# Patient Record
Sex: Male | Born: 1950 | Race: White | Hispanic: No | Marital: Married | Smoking: Never smoker
Health system: Southern US, Community
[De-identification: ages and names within clinical notes are randomized; demographics above are authoritative.]

## PROBLEM LIST (undated history)

## (undated) DIAGNOSIS — M1990 Unspecified osteoarthritis, unspecified site: Secondary | ICD-10-CM

## (undated) DIAGNOSIS — R39198 Other difficulties with micturition: Secondary | ICD-10-CM

## (undated) DIAGNOSIS — K7460 Unspecified cirrhosis of liver: Secondary | ICD-10-CM

## (undated) DIAGNOSIS — N200 Calculus of kidney: Secondary | ICD-10-CM

## (undated) DIAGNOSIS — E119 Type 2 diabetes mellitus without complications: Secondary | ICD-10-CM

## (undated) DIAGNOSIS — D696 Thrombocytopenia, unspecified: Secondary | ICD-10-CM

## (undated) DIAGNOSIS — N3000 Acute cystitis without hematuria: Secondary | ICD-10-CM

## (undated) DIAGNOSIS — N390 Urinary tract infection, site not specified: Secondary | ICD-10-CM

## (undated) DIAGNOSIS — R310 Gross hematuria: Secondary | ICD-10-CM

## (undated) HISTORY — PX: ORIF ANKLE FRACTURE: SUR919

## (undated) HISTORY — DX: Calculus of kidney: N20.0

## (undated) HISTORY — PX: JOINT REPLACEMENT: SHX530

## (undated) HISTORY — DX: Other difficulties with micturition: R39.198

## (undated) HISTORY — DX: Unspecified cirrhosis of liver: K74.60

## (undated) HISTORY — PX: FRACTURE SURGERY: SHX138

## (undated) HISTORY — DX: Acute cystitis without hematuria: N30.00

## (undated) HISTORY — DX: Gross hematuria: R31.0

---

## 2000-08-16 LAB — HM COLONOSCOPY

## 2006-05-09 ENCOUNTER — Ambulatory Visit: Payer: Self-pay

## 2012-08-28 ENCOUNTER — Inpatient Hospital Stay: Payer: Self-pay | Admitting: Orthopedic Surgery

## 2012-08-28 HISTORY — PX: ANKLE SURGERY: SHX546

## 2012-08-28 LAB — PROTIME-INR
INR: 1
Prothrombin Time: 13.7 secs (ref 11.5–14.7)

## 2012-08-28 LAB — COMPREHENSIVE METABOLIC PANEL
Albumin: 4.1 g/dL (ref 3.4–5.0)
Alkaline Phosphatase: 88 U/L (ref 50–136)
BUN: 15 mg/dL (ref 7–18)
Bilirubin,Total: 0.9 mg/dL (ref 0.2–1.0)
Co2: 28 mmol/L (ref 21–32)
Creatinine: 1.19 mg/dL (ref 0.60–1.30)
EGFR (African American): >60
EGFR (Non-African Amer.): >60
SGOT(AST): 49 U/L — ABNORMAL HIGH (ref 15–37)
SGPT (ALT): 50 U/L (ref 12–78)
Total Protein: 7.8 g/dL (ref 6.4–8.2)

## 2012-08-28 LAB — CBC
HCT: 47.4 % (ref 40.0–52.0)
HGB: 16.1 g/dL (ref 13.0–18.0)
MCH: 30.3 pg (ref 26.0–34.0)
MCHC: 34 g/dL (ref 32.0–36.0)
MCV: 89 fL (ref 80–100)
RBC: 5.32 10*6/uL (ref 4.40–5.90)

## 2012-08-28 LAB — APTT: Activated PTT: 30 secs (ref 23.6–35.9)

## 2012-08-30 LAB — CBC WITH DIFFERENTIAL/PLATELET
Basophil #: 0.1 10*3/uL (ref 0.0–0.1)
Eosinophil %: 1 %
HCT: 40.2 % (ref 40.0–52.0)
Lymphocyte #: 1.1 10*3/uL (ref 1.0–3.6)
MCV: 89 fL (ref 80–100)
Monocyte %: 8.3 %
Neutrophil #: 10.7 10*3/uL — ABNORMAL HIGH (ref 1.4–6.5)
Neutrophil %: 82 %
Platelet: 119 10*3/uL — ABNORMAL LOW (ref 150–440)
RBC: 4.53 10*6/uL (ref 4.40–5.90)
WBC: 13 10*3/uL — ABNORMAL HIGH (ref 3.8–10.6)

## 2012-08-30 LAB — BASIC METABOLIC PANEL
Anion Gap: 7 (ref 7–16)
BUN: 13 mg/dL (ref 7–18)
Calcium, Total: 8.2 mg/dL — ABNORMAL LOW (ref 8.5–10.1)
Chloride: 105 mmol/L (ref 98–107)
EGFR (African American): >60
EGFR (Non-African Amer.): >60
Glucose: 131 mg/dL — ABNORMAL HIGH (ref 65–99)
Osmolality: 278 (ref 275–301)
Potassium: 4.1 mmol/L (ref 3.5–5.1)

## 2012-12-02 ENCOUNTER — Encounter: Payer: Self-pay | Admitting: Cardiothoracic Surgery

## 2012-12-02 ENCOUNTER — Encounter: Payer: Self-pay | Admitting: Nurse Practitioner

## 2012-12-27 ENCOUNTER — Encounter: Payer: Self-pay | Admitting: Nurse Practitioner

## 2012-12-27 ENCOUNTER — Encounter: Payer: Self-pay | Admitting: Cardiothoracic Surgery

## 2013-06-23 ENCOUNTER — Ambulatory Visit: Payer: Self-pay | Admitting: Gastroenterology

## 2013-06-24 LAB — PATHOLOGY REPORT

## 2013-12-19 ENCOUNTER — Emergency Department: Payer: Self-pay | Admitting: Emergency Medicine

## 2013-12-29 ENCOUNTER — Emergency Department: Payer: Self-pay | Admitting: Emergency Medicine

## 2014-01-27 DEATH — deceased

## 2014-10-12 DIAGNOSIS — M1990 Unspecified osteoarthritis, unspecified site: Secondary | ICD-10-CM | POA: Insufficient documentation

## 2014-10-29 DIAGNOSIS — N390 Urinary tract infection, site not specified: Secondary | ICD-10-CM

## 2014-10-29 HISTORY — DX: Urinary tract infection, site not specified: N39.0

## 2014-11-03 LAB — HEPATIC FUNCTION PANEL
ALK PHOS: 97 U/L (ref 25–125)
ALT: 35 U/L (ref 10–40)
AST: 41 U/L — AB (ref 14–40)
BILIRUBIN, TOTAL: 0.8 mg/dL

## 2014-11-03 LAB — BASIC METABOLIC PANEL
BUN: 13 mg/dL (ref 4–21)
Creatinine: 1.1 mg/dL (ref <=1.3)
GLUCOSE: 127 mg/dL
Potassium: 4.4 mmol/L (ref 3.4–5.3)
SODIUM: 141 mmol/L (ref 137–147)

## 2014-11-03 LAB — LIPID PANEL
Cholesterol: 162 mg/dL (ref 0–200)
HDL: 31 mg/dL — AB (ref 35–70)
LDL Cholesterol: 52 mg/dL
LDl/HDL Ratio: 1.7
Triglycerides: 393 mg/dL — AB (ref 40–160)

## 2014-11-03 LAB — PSA: PSA: 1.1

## 2014-11-03 LAB — TSH: TSH: 3.71 u[IU]/mL (ref <=5.90)

## 2015-01-03 LAB — CBC AND DIFFERENTIAL
HEMATOCRIT: 46 % (ref 41–53)
Hemoglobin: 15.6 g/dL (ref 13.5–17.5)
NEUTROS ABS: 71 /uL
Platelets: 74 10*3/uL — AB (ref 150–399)
WBC: 5 10^3/mL

## 2015-01-03 LAB — HEMOGLOBIN A1C: Hgb A1c MFr Bld: 6 % (ref 4.0–6.0)

## 2015-01-14 ENCOUNTER — Ambulatory Visit: Payer: Self-pay | Admitting: Urology

## 2015-01-18 ENCOUNTER — Ambulatory Visit: Payer: Self-pay

## 2015-02-15 NOTE — Discharge Summary (Signed)
PATIENT NAME:  Kenneth Brown, Kenneth Brown MR#:  [id number] DATE OF BIRTH:  Aug 01, 1951  DATE OF ADMISSION:  08/28/2012 DATE OF DISCHARGE:  08/31/2012  ADMITTING DIAGNOSIS: Right trimalleolar ankle fracture.   DISCHARGE DIAGNOSIS: Status post open reduction internal fixation of right medial and lateral malleolar fractures.   HISTORY: The patient is a 64 year old male who was cutting wood on the day of admission when a five-foot log fell on his right ankle. He had immediate pain and deformity and was unable to bear weight on the right lower extremity. He was brought to the [facility] Emergency Department where x-rays revealed a trimalleolar ankle fracture. He was admitted to the Orthopedic Service for further evaluation and management.   PAST MEDICAL HISTORY: None.   PAST SURGICAL HISTORY: None.   MEDICATIONS: None.   ALLERGIES: No known drug allergies.   HOSPITAL COURSE: The patient was admitted to the Orthopedic Surgery Service. He was seen by the hospitalist on 08/28/2012. They felt that he was at low risk for orthopedic surgery and cleared him to proceed with operative fixation. I performed the History and Physical on 08/29/2012 in the morning. The patient was then later brought to the Operating Room where he underwent an uncomplicated open reduction and internal fixation of the medial and lateral malleoli. The posterior malleolar fragment was small and I did not feel it needed fixation. Postoperatively, the patient was brought back to the Orthopedic Surgery floor. He was kept on IV antibiotics for 24 hours postoperative. The medical service continued to follow him throughout his hospitalization. On postoperative day one, he was up out of bed to a chair. Physical and Occupational Therapy consults were called for postoperative day one. He had no shortness of breath or chest pain. The patient had a slow start with physical therapy on postoperative day one. He had some issues with postoperative pain which  improved during his hospitalization. On postoperative day two, the patient was doing well. He was sitting up in a chair and tolerating a p.o. diet. The patient demonstrated easy fatigability during physical therapy and was written for a wheelchair for the short term. The patient's friends and family will be available to help him, and the decision was made to allow him to go home. He was on Lovenox for deep vein thrombosis prophylaxis, 30 mg twice a day while an inpatient. The patient was discharged home on postoperative day two as he was clinically stable and doing well. His pain was under control, and he was making progress with physical therapy.   DISCHARGE INSTRUCTIONS:  1. The patient will be discharged home with instructions to not bear any weight on the right lower extremity.  2. He should elevate the right lower extremity whenever possible on pillows at home.  3. He should stay off his leg as much as possible for the first week postoperative.  4. He may resume a regular diet.  5. He will follow up with [facility] in 7 to 10 days postoperative for wound check and cast application.  6. He will take enteric-coated aspirin 325 mg b.i.d. and will take oxycodone 5 to 10 mg every 3 to 4 hours p.r.n. for pain. I also recommended he take Colace 100 mg p.o. b.i.d. while taking pain medications.   ____________________________ Timoteo Gaul, MD klk:cbb D: 09/12/2012 17:24:23 ET T: 09/12/2012 18:08:55 ET JOB#: [id number]  cc: Timoteo Gaul, MD, <Dictator> Timoteo Gaul MD ELECTRONICALLY SIGNED 09/15/2012 10:51

## 2015-02-15 NOTE — Consult Note (Signed)
Brief Consult Note: Diagnosis: Right ankle fracture.   Patient was seen by consultant.   Consult note dictated.   Recommend to proceed with surgery or procedure.   Orders entered.   Comments: Low risk for orthopedic surgery.  Electronic Signatures: Alba Destine (MD)  (Signed 31-Oct-13 22:21)  Authored: Brief Consult Note   Last Updated: 31-Oct-13 22:21 by Alba Destine (MD)

## 2015-02-15 NOTE — Op Note (Signed)
PATIENT NAME:  Kenneth Brown, Kenneth Brown MR#:  [id number] DATE OF BIRTH:  05-25-1951  DATE OF PROCEDURE:  08/29/2012  PREOPERATIVE DIAGNOSIS: Right ankle trimalleolar fracture.   POSTOPERATIVE DIAGNOSIS: Right ankle trimalleolar fracture.   PROCEDURE: Open reduction and internal fixation of medial and lateral malleolar fractures.   SURGEON: Thornton Park, M.D.   ANESTHESIA: General.   ESTIMATED BLOOD LOSS: 100 mL.   COMPLICATIONS: None.   SPECIMENS: None.   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 He was splitting wood and a large log fell and hit him in the right lower leg causing a twisting injury to his right ankle. He was diagnosed with a trimalleolar ankle fracture by x-ray at [facility]. The patient was admitted to the orthopedic surgery service for further evaluation and management. He was cleared by the medical service for surgery. I recommended open reduction and internal fixation to the patient given his high level of functioning and the displacement of the fracture. He understands the risks include infection, bleeding, nerve or blood vessel injury, especially injury to the superficial peroneal nerve, ankle stiffness, painful hardware, the development of osteoarthritis, persistent pain or instability, malunion, nonunion, and the need for further surgery including hardware removal. Medical complications include deep vein thrombosis and pulmonary embolism, myocardial infarction, stroke, pneumonia, respiratory failure, and death. The patient understood these risks and wished to proceed.   PROCEDURE NOTE: The patient had the word "yes" written over the right leg preoperatively. This was according to the hospital's right site protocol. The patient was then brought to the operating room where he was placed supine on the operative table. He was prepped and draped in  sterile fashion. A time-out was performed to verify the patient's name, date of birth, medical record number, correct site of surgery, and correct procedure to be performed. It was also used to verify the patient had received antibiotics and that all appropriate instruments, implants, and radiographic studies were available in the room. Once all in attendance were in agreement, the case began.   FluoroScan images were taken of the right ankle. This helped the plan for the surgical incisions. A #10 blade was then used to make a lateral incision centered over the fibula. The subcutaneous tissues were carefully dissected using Metzenbaum scissors and pick-up. The fracture site was then easily identified. The fracture edges were cleaned with an elevator. The fracture hematoma was removed using suction and  irrigation. The fracture of the fibula was found to be comminuted with a large posterior fragment. The fracture was reduced with a fracture reduction clamp. A 3.5-mm lag screw used in compression fashion was then placed across the fracture site.  Then a 10-hole one-third tubular plate was then placed alongside the lateral malleolus. Three distal bicortical screws were used for fixation along with a four proximal bicortical screws. The fracture reduction and implant position were confirmed on AP and lateral FluoroScan images. Once the fibula was anatomically reduced, the wound was copiously irrigated and a moist Ray-Tec placed in the wound bed. The attention was then turned to the medial side.   Again, FluoroScan was used to plan the medial incision. A vertical 2 to 3-cm incision centered over the medial malleolus was then created. The Metzenbaum scissor and pick-up were then used to dissect the soft tissues. The fracture site was easily identified. Again, the fracture hematoma was irrigated and suctioned. The soft tissues  at the fracture edges were removed using an elevator. The fracture was then reduced and two  K-wires were inserted into the tip of the medial malleolus and across the fracture into the tibia. The positions of these K-wires were confirmed on FluoroScan images. These were then overdrilled and 4-0 cannulated screws placed, one 46 mm in length and one 40 mm in length. Both screws were long threaded. FluoroScan images of the final construct were then taken. Stress testing was performed to confirm that there was no injury of the syndesmosis creating widening between the distal fibula and tibia. A cotton test was also performed to confirm ankle stability from a ligamentous standpoint. Both wounds were then copiously irrigated. The soft tissues were closed with 2-0 Vicryl and the skin closed with skin staples. A dry sterile dressing was applied along with an AO ankle splint. The patient was then awakened and transferred to the PAC-U in stable condition. I was scrubbed and present for the entire case and all sharp and instrument counts were correct at the conclusion of the case. I spoke with the patient's wife postoperatively to let her know the case had gone without complication and the patient was stable in the recovery room.    ____________________________ Timoteo Gaul, MD klk:bjt D:  08/31/2012 22:24:20 ET          T: 09/01/2012 06:13:42 ET        JOB#: [id number] Timoteo Gaul MD ELECTRONICALLY SIGNED 09/03/2012 8:45

## 2015-02-15 NOTE — H&P (Signed)
Subjective/Chief Complaint Right ankle pain    History of Present Illness 64 y/o male was cutting wood yesterday and a 5 foot log fell on his right ankle.  He had pain and deformity and was unable to stand on it after the fall.  At [facility] ED x-rays revealed bimalleolar ankle fracture.  He was admitted to orthopaedics for further evaluation.   Past Med/Surgical Hx:  denies:   denies:   ALLERGIES:  No Known Allergies:   Family and Social History:   Family History Non-Contributory    Place of Living Home   Review of Systems:   Subjective/Chief Complaint Right ankle pain    Medications/Allergies Reviewed Medications/Allergies reviewed   Physical Exam:   GEN no acute distress    HEENT PERRL, hearing intact to voice, moist oral mucosa, Oropharynx clear, good dentition    NECK supple  No masses  trachea midline    RESP normal resp effort  clear BS  no use of accessory muscles    CARD regular rate  no murmur  no JVD    ABD denies tenderness  soft  normal BS    LYMPH negative neck    EXTR Right lower extremity in posterior splint.  Toes are well pefused bilaterally.  Intact sensation to light touch in all toes.  Patient can flex and extend all toes.  No obvious deformity.    SKIN normal to palpation    NEURO motor/sensory function intact    PSYCH A+O to time, place, person   Lab Results: Hepatic:  31-Oct-13 22:07    Bilirubin, Total 0.9   Alkaline Phosphatase 88   SGPT (ALT) 50   SGOT (AST)  49   Total Protein, Serum 7.8   Albumin, Serum 4.1  Routine Chem:  31-Oct-13 22:07    Glucose, Serum  112   BUN 15   Creatinine (comp) 1.19   Sodium, Serum 140   Potassium, Serum 4.1   Chloride, Serum 103   CO2, Serum 28   Calcium (Total), Serum 9.2   Osmolality (calc) 281   eGFR (African American) >60   eGFR (Non-African American) >60 (eGFR values <53m/min/1.73 m2 may be an indication of chronic kidney disease (CKD). Calculated eGFR is useful in patients with stable  renal function. The eGFR calculation will not be reliable in acutely ill patients when serum creatinine is changing rapidly. It is not useful in  patients on dialysis. The eGFR calculation may not be applicable to patients at the low and high extremes of body sizes, pregnant women, and vegetarians.)   Anion Gap 9  Routine Coag:  31-Oct-13 22:07    Prothrombin 13.7   INR 1.0 (INR reference interval applies to patients on anticoagulant therapy. A single INR therapeutic range for coumarins is not optimal for all indications; however, the suggested range for most indications is 2.0 - 3.0. Exceptions to the INR Reference Range may include: Prosthetic heart valves, acute myocardial infarction, prevention of myocardial infarction, and combinations of aspirin and anticoagulant. The need for a higher or lower target INR must be assessed individually. Reference: The Pharmacology and Management of the Vitamin K  antagonists: the seventh ACCP Conference on Antithrombotic and Thrombolytic Therapy. CASUOR.5615Sept:126 (3suppl): [id number] A HCT value >55% may artifactually increase the PT.  In one study,  the increase was an average of 25%. Reference:  "Effect on Routine and Special Coagulation Testing Values of Citrate Anticoagulant Adjustment in Patients with High HCT Values." American Journal of Clinical Pathology 2006;126:400-405.)  Activated PTT (APTT) 30.0 (A HCT value >55% may artifactually increase the APTT. In one study, the increase was an average of 19%. Reference: "Effect on Routine and Special Coagulation Testing Values of Citrate Anticoagulant Adjustment in Patients with High HCT Values." American Journal of Clinical Pathology 2006;126:400-405.)  Routine Hem:  31-Oct-13 22:07    WBC (CBC)  12.3   RBC (CBC) 5.32   Hemoglobin (CBC) 16.1   Hematocrit (CBC) 47.4   Platelet Count (CBC) 153 (Result(s) reported on 28 Aug 2012 at 10:35PM.)   MCV 89   MCH 30.3   MCHC 34.0   RDW  14.9    Radiology Results: XRay:    31-Oct-13 20:45, Ankle Right Complete   Ankle Right Complete   REASON FOR EXAM:    fracture/trauma  COMMENTS:       PROCEDURE: DXR - DXR ANKLE RIGHT COMPLETE  - Aug 28 2012  8:45PM     RESULT: Comparison: None.    Findings:  There is a displaced fracture of the distal fibula. There are displaced   fractures of the medial malleolus as well as displaced fracture of the   posterior malleolus of the tibia. There is disruption and widening of the   ankle mortise.    IMPRESSION:   Trimalleolar fracture.  Dictation site: 2          Verified By: Gregor Hams, M.D., MD    31-Oct-13 21:12, Chest Portable Single View   Chest Portable Single View   REASON FOR EXAM:    ankle fx pre op  COMMENTS:       PROCEDURE: DXR - DXR PORTABLE CHEST SINGLE VIEW  - Aug 28 2012  9:12PM     RESULT: Comparison: None.    Findings:  Heart size upper limits normal to mildly enlarged. There is mild   prominence of the mid and superior mediastinum. No focal pulmonary   opacities.    IMPRESSION:   1. No acute cardiopulmonary disease.  2. Mild prominence of the mid and superior mediastinum is likely     secondary to AP portable technique. However, upright PA and lateral chest   radiograph is recommended.    Dictation site: 2          Verified By: Gregor Hams, M.D., MD     Assessment/Admission Diagnosis Right trimalleolar fracture    Plan I explained the injury to the patient and his wife and drew diagrams of the injury and the proposed operation on the white board in his room.  I explained the risks and benefits of fixing the fracture surgically.  The risks and benefits of surgical intervention were discussed in detail with the patient. The patient expressed understanding of the risks and benefits and agreed with plans for surgery.  The risks include, but are not limited to: infection, bleeding, nerve and blood vessel injury (especially the superficial  peroneal nerve leading to dorsal foot numbness), persistent ankle pain, swelling, instability or stiffness, osteoarthritis, maluion, nonunion, painful hardware or hardware failure, need for more surgery including hardware removal, DVT, and PE, MI, stroke, pneumonia, respiratory failure, death.  Surgical site signed as per "right site surgery" protocol. Patient has been cleared by hospitalist.   He is NPO.  I have personally reviewed all labs and radiographs.  The posterior malleolar fragment does not appear large enough to require fixation.  The plan is to fix the medial and lateral malleoli, but stability of the ankle will be assessed in the OR and  the posterior malleolus will be fixed if necessary.  I answered all questions by the patient and his wife.   Electronic Signatures: Thornton Park (MD)  (Signed [phone] 10:46)  Authored: CHIEF COMPLAINT and HISTORY, PAST MEDICAL/SURGIAL HISTORY, ALLERGIES, FAMILY AND SOCIAL HISTORY, REVIEW OF SYSTEMS, PHYSICAL EXAM, LABS, Radiology, ASSESSMENT AND PLAN   Last Updated: 01-Nov-13 10:46 by Thornton Park (MD)

## 2015-02-15 NOTE — Consult Note (Signed)
PATIENT NAME:  Kenneth Brown, Kenneth Brown MR#:  [id number] DATE OF BIRTH:  Jul 01, 1951  DATE OF CONSULTATION:  08/28/2012  REFERRING PHYSICIAN:  Earnestine Leys, MD CONSULTING PHYSICIAN:  Emili Mcloughlin R. Shylah Dossantos, MD  PRIMARY CARE PHYSICIAN: Dr. Rosanna Randy.   REASON FOR CONSULTATION:  Preop evaluation.   CHIEF COMPLAINT: Left ankle pain.   HISTORY OF PRESENT ILLNESS: A 64 year old male patient with no significant past medical history presented to the ER after a tree stump fell on his ankle. The patient has an ankle fracture. He is being admitted by the orthopedic surgeon, Dr. Sabra Heck, and hospitalist team has been asked to consult for a preop evaluation. The patient does not have any diabetes, hypertension, coronary artery disease or lung problems. He just takes Aleve at home as needed for some joint pains. He has an active lifestyle with good functional status. No chest pain or shortness of breath on walking. No prior surgeries. No complications with anesthesia. He did have a stress test many years back for an insurance evaluation. Never had chest pain.   PAST MEDICAL HISTORY: Occasional joint pains.   PAST SURGICAL HISTORY: None.   SOCIAL HISTORY: The patient does not smoke, 1 to 2 beers a week. No illicit drugs. He works as a Clinical biochemist.   FAMILY HISTORY: Father had a myocardial infarction in his 30s, had a defibrillator. Mother had congestive heart failure and coronary artery disease.   ALLERGIES: No known drug allergies.   REVIEW OF SYSTEMS:   CONSTITUTIONAL: No fever, fatigue, weakness.   EYES: No blurred vision, pain, or redness.   ENT: No tinnitus, ear pain, hearing loss.   RESPIRATORY: No cough, wheeze, hemoptysis, or dyspnea.   CARDIOVASCULAR: No chest pain, orthopnea, edema.   GASTROINTESTINAL: No nausea, vomiting, diarrhea, abdominal pain.   GENITOURINARY: No dysuria, hematuria, frequency.   ENDOCRINE: No polyuria, nocturia, thyroid problems.   HEMATOLOGIC/LYMPHATIC: No anemia, easy  bruising, bleeding.   INTEGUMENTARY: No acne, rash, lesions.   MUSCULOSKELETAL: Has on and off back pain but no significant joint swelling or redness.   NEUROLOGIC: No numbness, weakness, dysarthria.   PSYCHIATRIC: No anxiety or insomnia.   HOME MEDICATIONS: Aleve p.r.n.   PHYSICAL EXAMINATION:  VITAL SIGNS: Temperature 98.4, pulse 83, blood pressure 130/73, saturating 96% on room air.   GENERAL: Obese, Caucasian male patient lying in bed, comfortable, conversational, cooperative with exam.   PSYCHIATRIC: Alert, oriented x3. Mood and affect appropriate. Judgment intact.   HEENT: Atraumatic, normocephalic. Oral mucosa moist and pink. External ears and nose normal. No pallor. No icterus. Pupils bilaterally equal and reactive to light.   NECK: Supple. No thyromegaly. No palpable lymph nodes. Trachea midline. No carotid bruits.   CARDIOVASCULAR: S1, S2, regular rate and rhythm without any murmurs.   RESPIRATORY: Normal work of breathing. Clear to auscultation on both sides.   GASTROINTESTINAL: Soft abdomen, nontender. Bowel sounds present. No hepatosplenomegaly.   SKIN: Warm and dry. No petechiae, rash, ulcers.   MUSCULOSKELETAL: Has right ankle swelling with tenderness, edema. No other joint swelling, redness noticed.   NEUROLOGICAL: Motor strength five out of five in upper and lower extremities. Sensation to fine touch intact all over. Peripheral pulses 2+ all over.   LABORATORY STUDIES: Pending. EKG shows normal sinus rhythm with no acute ST-T wave changes.   ASSESSMENT AND PLAN:  1. Left ankle fracture. The patient is being admitted to the orthopedic service. The patient will be a low risk for the low risk orthopedic procedure. We will monitor for any blood  loss, infections or other complications.  2. Deep venous thrombosis prophylaxis as per orthopedics. We will put the patient on incentive spirometry.   CODE STATUS: FULL CODE.   TIME SPENT TODAY ON THIS CASE: 35  minutes.  ____________________________ Leia Alf Maryori Weide, MD srs:ap D: 08/28/2012 22:26:10 ET T: 08/29/2012 07:02:16 ET JOB#: [id number]  cc: Alveta Heimlich R. Nashiya Disbrow, MD, <Dictator> Richard L. Rosanna Randy, MD Neita Carp MD ELECTRONICALLY SIGNED 08/29/2012 13:29

## 2015-03-02 DIAGNOSIS — Z8781 Personal history of (healed) traumatic fracture: Secondary | ICD-10-CM | POA: Insufficient documentation

## 2015-03-02 DIAGNOSIS — E119 Type 2 diabetes mellitus without complications: Secondary | ICD-10-CM | POA: Insufficient documentation

## 2015-03-02 DIAGNOSIS — Z8619 Personal history of other infectious and parasitic diseases: Secondary | ICD-10-CM | POA: Insufficient documentation

## 2015-03-02 DIAGNOSIS — J309 Allergic rhinitis, unspecified: Secondary | ICD-10-CM | POA: Insufficient documentation

## 2015-03-02 DIAGNOSIS — R7303 Prediabetes: Secondary | ICD-10-CM | POA: Insufficient documentation

## 2015-03-02 DIAGNOSIS — Z87442 Personal history of urinary calculi: Secondary | ICD-10-CM | POA: Insufficient documentation

## 2015-03-02 DIAGNOSIS — E785 Hyperlipidemia, unspecified: Secondary | ICD-10-CM | POA: Insufficient documentation

## 2015-03-02 DIAGNOSIS — R748 Abnormal levels of other serum enzymes: Secondary | ICD-10-CM | POA: Insufficient documentation

## 2015-03-02 DIAGNOSIS — R030 Elevated blood-pressure reading, without diagnosis of hypertension: Secondary | ICD-10-CM

## 2015-03-02 DIAGNOSIS — E669 Obesity, unspecified: Secondary | ICD-10-CM | POA: Insufficient documentation

## 2015-03-02 DIAGNOSIS — Z86010 Personal history of colonic polyps: Secondary | ICD-10-CM | POA: Insufficient documentation

## 2015-03-02 DIAGNOSIS — IMO0001 Reserved for inherently not codable concepts without codable children: Secondary | ICD-10-CM | POA: Insufficient documentation

## 2015-03-02 DIAGNOSIS — Z87898 Personal history of other specified conditions: Secondary | ICD-10-CM | POA: Insufficient documentation

## 2015-03-08 ENCOUNTER — Encounter
Admission: RE | Admit: 2015-03-08 | Discharge: 2015-03-08 | Disposition: A | Discharge status: Home / Self Care | Payer: PPO | Source: Ambulatory Visit | Attending: Urology | Admitting: Urology

## 2015-03-08 DIAGNOSIS — Z01812 Encounter for preprocedural laboratory examination: Secondary | ICD-10-CM | POA: Diagnosis present

## 2015-03-08 DIAGNOSIS — Z8619 Personal history of other infectious and parasitic diseases: Secondary | ICD-10-CM | POA: Insufficient documentation

## 2015-03-08 DIAGNOSIS — R030 Elevated blood-pressure reading, without diagnosis of hypertension: Secondary | ICD-10-CM | POA: Insufficient documentation

## 2015-03-08 DIAGNOSIS — E669 Obesity, unspecified: Secondary | ICD-10-CM | POA: Insufficient documentation

## 2015-03-08 DIAGNOSIS — E119 Type 2 diabetes mellitus without complications: Secondary | ICD-10-CM | POA: Diagnosis not present

## 2015-03-08 DIAGNOSIS — N200 Calculus of kidney: Secondary | ICD-10-CM | POA: Insufficient documentation

## 2015-03-08 DIAGNOSIS — Z01810 Encounter for preprocedural cardiovascular examination: Secondary | ICD-10-CM | POA: Insufficient documentation

## 2015-03-08 DIAGNOSIS — E785 Hyperlipidemia, unspecified: Secondary | ICD-10-CM | POA: Diagnosis not present

## 2015-03-08 DIAGNOSIS — J309 Allergic rhinitis, unspecified: Secondary | ICD-10-CM | POA: Insufficient documentation

## 2015-03-08 HISTORY — DX: Type 2 diabetes mellitus without complications: E11.9

## 2015-03-08 HISTORY — DX: Urinary tract infection, site not specified: N39.0

## 2015-03-08 LAB — URINALYSIS COMPLETE WITH MICROSCOPIC (ARMC ONLY)
Bacteria, UA: NONE SEEN
Bilirubin Urine: NEGATIVE
Glucose, UA: NEGATIVE mg/dL
Hgb urine dipstick: NEGATIVE
KETONES UR: NEGATIVE mg/dL
NITRITE: NEGATIVE
PH: 6 (ref 5.0–8.0)
Protein, ur: NEGATIVE mg/dL
SPECIFIC GRAVITY, URINE: 1.018 (ref 1.005–1.030)

## 2015-03-08 LAB — BASIC METABOLIC PANEL
Anion gap: 7 (ref 5–15)
BUN: 16 mg/dL (ref 6–20)
CHLORIDE: 104 mmol/L (ref 101–111)
CO2: 28 mmol/L (ref 22–32)
CREATININE: 1.3 mg/dL — AB (ref 0.61–1.24)
Calcium: 9.1 mg/dL (ref 8.9–10.3)
GFR calc Af Amer: >60 mL/min (ref >60)
GFR calc non Af Amer: 56 mL/min — ABNORMAL LOW (ref >60)
Glucose, Bld: 95 mg/dL (ref 65–99)
Potassium: 4.5 mmol/L (ref 3.5–5.1)
Sodium: 139 mmol/L (ref 135–145)

## 2015-03-08 LAB — CBC
HCT: 45.3 % (ref 40.0–52.0)
Hemoglobin: 15.1 g/dL (ref 13.0–18.0)
MCH: 29.7 pg (ref 26.0–34.0)
MCHC: 33.3 g/dL (ref 32.0–36.0)
MCV: 89.1 fL (ref 80.0–100.0)
Platelets: 59 10*3/uL — ABNORMAL LOW (ref 150–440)
RBC: 5.08 MIL/uL (ref 4.40–5.90)
RDW: 15.8 % — ABNORMAL HIGH (ref 11.5–14.5)
WBC: 4.6 10*3/uL (ref 3.8–10.6)

## 2015-03-08 NOTE — Patient Instructions (Signed)
  Your procedure is scheduled on: Monday Mar 21, 2015 Report to Same Day Surgery. To find out your arrival time please call [phone] between 1PM - 3PM on Friday Mar 18, 2015 .  Remember: Instructions that are not followed completely may result in serious medical risk, up to and including death, or upon the discretion of your surgeon and anesthesiologist your surgery may need to be rescheduled.    __X__ 1. Do not eat food or drink liquids after midnight. No gum chewing or hard candies.     __X__ 2. No Alcohol for 24 hours before or after surgery.   ____ 3. Bring all medications with you on the day of surgery if instructed.    __X__ 4. Notify your doctor if there is any change in your medical condition     (cold, fever, infections).     Do not wear jewelry, make-up, hairpins, clips or nail polish.  Do not wear lotions, powders, or perfumes. You may wear deodorant.  Do not shave 48 hours prior to surgery. Men may shave face and neck.  Do not bring valuables to the hospital.    The [facility] is not responsible for any belongings or valuables.               Contacts, dentures or bridgework may not be worn into surgery.  Leave your suitcase in the car. After surgery it may be brought to your room.  For patients admitted to the hospital, discharge time is determined by your treatment team.   Patients discharged the day of surgery will not be allowed to drive home.   Please read over the following fact sheets that you were given:      __X__ Take these medicines the morning of surgery with A SIP OF WATER:    1. OXYCODONE WITH TYLENOL IS OPTIONAL  2. fLOMAX   ____ Fleet Enema (as directed)   ____ Use CHG Soap as directed  ____ Use inhalers on the day of surgery  ____ Stop metformin 2 days prior to surgery    ____ Take 1/2 of usual insulin dose the night before surgery and none on the morning of surgery.   ____ Stop Coumadin/Plavix/aspirin on DOES NOT APPLY.  ____ Stop  Anti-inflammatories on PT DOES NOT TAKE ANTI INFLAMMATORIES DUE TO HX OF BLEEDING.  ____ Stop supplements until after surgery.    ____ Bring C-Pap to the hospital.

## 2015-03-10 LAB — URINE CULTURE: Culture: NO GROWTH

## 2015-03-14 ENCOUNTER — Other Ambulatory Visit: Payer: Self-pay

## 2015-03-21 ENCOUNTER — Ambulatory Visit: Payer: PPO | Admitting: Certified Registered Nurse Anesthetist

## 2015-03-21 ENCOUNTER — Encounter
Admission: RE | Disposition: A | Discharge status: Home / Self Care | Payer: Self-pay | Source: Ambulatory Visit | Attending: Urology

## 2015-03-21 ENCOUNTER — Encounter: Payer: Self-pay | Admitting: Anesthesiology

## 2015-03-21 ENCOUNTER — Ambulatory Visit
Admission: RE | Admit: 2015-03-21 | Discharge: 2015-03-21 | Disposition: A | Discharge status: Home / Self Care | Payer: PPO | Source: Ambulatory Visit | Attending: Urology | Admitting: Urology

## 2015-03-21 DIAGNOSIS — N200 Calculus of kidney: Secondary | ICD-10-CM | POA: Diagnosis not present

## 2015-03-21 DIAGNOSIS — Z836 Family history of other diseases of the respiratory system: Secondary | ICD-10-CM | POA: Insufficient documentation

## 2015-03-21 DIAGNOSIS — K219 Gastro-esophageal reflux disease without esophagitis: Secondary | ICD-10-CM | POA: Diagnosis not present

## 2015-03-21 DIAGNOSIS — R310 Gross hematuria: Secondary | ICD-10-CM | POA: Insufficient documentation

## 2015-03-21 DIAGNOSIS — Z8249 Family history of ischemic heart disease and other diseases of the circulatory system: Secondary | ICD-10-CM | POA: Insufficient documentation

## 2015-03-21 DIAGNOSIS — E669 Obesity, unspecified: Secondary | ICD-10-CM | POA: Insufficient documentation

## 2015-03-21 DIAGNOSIS — N201 Calculus of ureter: Secondary | ICD-10-CM | POA: Diagnosis present

## 2015-03-21 HISTORY — PX: URETEROSCOPY WITH HOLMIUM LASER LITHOTRIPSY: SHX6645

## 2015-03-21 SURGERY — URETEROSCOPY, WITH LITHOTRIPSY USING HOLMIUM LASER
Anesthesia: General | Laterality: Right | Wound class: Clean Contaminated

## 2015-03-21 MED ORDER — LACTATED RINGERS IV SOLN
INTRAVENOUS | Status: DC
  Administered 2015-03-21: 09:00:00 via INTRAVENOUS

## 2015-03-21 MED ORDER — ONDANSETRON HCL 4 MG/2ML IJ SOLN: 4.0000 mg | Freq: Once | INTRAMUSCULAR | Status: DC | PRN

## 2015-03-21 MED ORDER — GLYCOPYRROLATE 0.2 MG/ML IJ SOLN
INTRAMUSCULAR | Status: DC | PRN
  Administered 2015-03-21: 0.2 mg via INTRAVENOUS

## 2015-03-21 MED ORDER — BELLADONNA ALKALOIDS-OPIUM 16.2-60 MG RE SUPP
RECTAL | Status: AC
  Filled 2015-03-21: qty 1

## 2015-03-21 MED ORDER — OXYCODONE-ACETAMINOPHEN 5-325 MG PO TABS: 1.0000 | ORAL_TABLET | Freq: Four times a day (QID) | ORAL | Status: DC | PRN

## 2015-03-21 MED ORDER — LIDOCAINE HCL (CARDIAC) 20 MG/ML IV SOLN
INTRAVENOUS | Status: DC | PRN
  Administered 2015-03-21: 50 mg via INTRAVENOUS

## 2015-03-21 MED ORDER — FENTANYL CITRATE (PF) 100 MCG/2ML IJ SOLN: 25.0000 ug | INTRAMUSCULAR | Status: DC | PRN

## 2015-03-21 MED ORDER — ONDANSETRON HCL 4 MG/2ML IJ SOLN
INTRAMUSCULAR | Status: DC | PRN
  Administered 2015-03-21: 4 mg via INTRAVENOUS

## 2015-03-21 MED ORDER — BUPIVACAINE HCL 0.5 % IJ SOLN
INTRAMUSCULAR | Status: DC | PRN
  Administered 2015-03-21: 30 mL

## 2015-03-21 MED ORDER — BELLADONNA ALKALOIDS-OPIUM 16.2-60 MG RE SUPP
RECTAL | Status: DC | PRN
  Administered 2015-03-21: 1 via RECTAL

## 2015-03-21 MED ORDER — IOTHALAMATE MEGLUMINE 43 % IV SOLN
INTRAVENOUS | Status: DC | PRN
  Administered 2015-03-21: 20 mL

## 2015-03-21 MED ORDER — MIDAZOLAM HCL 2 MG/2ML IJ SOLN
INTRAMUSCULAR | Status: DC | PRN
  Administered 2015-03-21: 1 mg via INTRAVENOUS

## 2015-03-21 MED ORDER — FAMOTIDINE 20 MG PO TABS
20.0000 mg | ORAL_TABLET | Freq: Once | ORAL | Status: AC
  Administered 2015-03-21: 20 mg via ORAL

## 2015-03-21 MED ORDER — LEVOFLOXACIN IN D5W 500 MG/100ML IV SOLN
500.0000 mg | Freq: Once | INTRAVENOUS | Status: AC
  Administered 2015-03-21: 500 mg via INTRAVENOUS

## 2015-03-21 MED ORDER — OXYCODONE-ACETAMINOPHEN 5-325 MG PO TABS
ORAL_TABLET | ORAL | Status: AC
  Filled 2015-03-21: qty 1

## 2015-03-21 MED ORDER — FAMOTIDINE 20 MG PO TABS
ORAL_TABLET | ORAL | Status: AC
  Administered 2015-03-21: 20 mg via ORAL
  Filled 2015-03-21: qty 1

## 2015-03-21 MED ORDER — BUPIVACAINE HCL (PF) 0.5 % IJ SOLN
INTRAMUSCULAR | Status: AC
  Filled 2015-03-21: qty 30

## 2015-03-21 MED ORDER — PROPOFOL 10 MG/ML IV BOLUS
INTRAVENOUS | Status: DC | PRN
Start: 2015-03-21 — End: 2015-03-21
  Administered 2015-03-21: 200 mg via INTRAVENOUS

## 2015-03-21 MED ORDER — OXYCODONE-ACETAMINOPHEN 5-325 MG PO TABS
1.0000 | ORAL_TABLET | Freq: Four times a day (QID) | ORAL | Status: DC | PRN
Start: 2015-03-21 — End: 2015-03-21
  Administered 2015-03-21: 1 via ORAL

## 2015-03-21 MED ORDER — PHENYLEPHRINE HCL 10 MG/ML IJ SOLN
INTRAMUSCULAR | Status: DC | PRN
  Administered 2015-03-21 (×3): 100 ug via INTRAVENOUS
  Administered 2015-03-21: 200 ug via INTRAVENOUS

## 2015-03-21 MED ORDER — LEVOFLOXACIN IN D5W 500 MG/100ML IV SOLN
INTRAVENOUS | Status: AC
  Administered 2015-03-21: 500 mg via INTRAVENOUS
  Filled 2015-03-21: qty 100

## 2015-03-21 MED ORDER — FENTANYL CITRATE (PF) 100 MCG/2ML IJ SOLN
INTRAMUSCULAR | Status: DC | PRN
  Administered 2015-03-21: 50 ug via INTRAVENOUS

## 2015-03-21 MED ORDER — SODIUM CHLORIDE 0.9 % IV SOLN
INTRAVENOUS | Status: DC | PRN
  Administered 2015-03-21: 09:00:00 via INTRAVENOUS

## 2015-03-21 SURGICAL SUPPLY — 27 items
BAG DRAIN CYSTO-URO LG1000N (MISCELLANEOUS) IMPLANT
CATH URETL 5X70 OPEN END (CATHETERS) ×3 IMPLANT
CNTNR SPEC 2.5X3XGRAD LEK (MISCELLANEOUS) ×1
CONRAY 43 FOR UROLOGY 50M (MISCELLANEOUS) ×3 IMPLANT
CONT SPEC 4OZ STER OR WHT (MISCELLANEOUS) ×2
CONTAINER SPEC 2.5X3XGRAD LEK (MISCELLANEOUS) ×1 IMPLANT
FEE TECHNICIAN ONLY PER HOUR (MISCELLANEOUS) ×3 IMPLANT
GLIDEWIRE STIFF .35X180X3 HYDR (WIRE) ×3 IMPLANT
GLOVE BIO SURGEON STRL SZ7 (GLOVE) ×6 IMPLANT
GLOVE BIO SURGEON STRL SZ7.5 (GLOVE) ×3 IMPLANT
GOWN STRL REUS W/ TWL LRG LVL3 (GOWN DISPOSABLE) ×1 IMPLANT
GOWN STRL REUS W/ TWL XL LVL3 (GOWN DISPOSABLE) ×1 IMPLANT
GOWN STRL REUS W/TWL LRG LVL3 (GOWN DISPOSABLE) ×2
GOWN STRL REUS W/TWL XL LVL3 (GOWN DISPOSABLE) ×2
GUIDEWIRE STR ZIPWIRE 035X150 (MISCELLANEOUS) ×3 IMPLANT
INTRODUCER DILATOR DOUBLE (INTRODUCER) ×3 IMPLANT
JELLY LUB 2OZ STRL (MISCELLANEOUS) ×2
JELLY LUBE 2OZ STRL (MISCELLANEOUS) ×1 IMPLANT
PACK CYSTO AR (MISCELLANEOUS) ×3 IMPLANT
PREP PVP WINGED SPONGE (MISCELLANEOUS) ×3 IMPLANT
SENSORWIRE 0.038 NOT ANGLED (WIRE) ×3
SET CYSTO W/LG BORE CLAMP LF (SET/KITS/TRAYS/PACK) ×3 IMPLANT
SOL .9 NS 3000ML IRR  AL (IV SOLUTION) ×2
SOL .9 NS 3000ML IRR UROMATIC (IV SOLUTION) ×1 IMPLANT
STENT URET 6FRX26 CONTOUR (STENTS) ×3 IMPLANT
WATER STERILE IRR 1000ML POUR (IV SOLUTION) ×3 IMPLANT
WIRE SENSOR 0.038 NOT ANGLED (WIRE) ×1 IMPLANT

## 2015-03-21 NOTE — OR Nursing (Signed)
Right stent placement

## 2015-03-21 NOTE — Transfer of Care (Signed)
Immediate Anesthesia Transfer of Care Note  Patient: Kenneth Brown  Procedure(s) Performed: Procedure(s): URETEROSCOPY WITH HOLMIUM LASER LITHOTRIPSY,retrograde pyelogram,stent placement (Right)  Patient Location: PACU  Anesthesia Type:General  Level of Consciousness: Alert, Awake, Oriented  Airway & Oxygen Therapy: Patient Spontanous Breathing  Post-op Assessment: Report given to RN  Post vital signs: Reviewed and stable  Last Vitals:  Filed Vitals:   03/21/15 1018  BP: 130/86  Pulse: 82  Temp: 36.2 C  Resp: 19    Complications: No apparent anesthesia complications

## 2015-03-21 NOTE — Anesthesia Procedure Notes (Signed)
Procedure Name: LMA Insertion Date/Time: 03/21/2015 8:57 AM Performed by: Eliberto Ivory Pre-anesthesia Checklist: Patient identified, Patient being monitored, Timeout performed, Emergency Drugs available and Suction available Patient Re-evaluated:Patient Re-evaluated prior to inductionOxygen Delivery Method: Circle system utilized Preoxygenation: Pre-oxygenation with 100% oxygen Intubation Type: IV induction Ventilation: Mask ventilation without difficulty LMA: LMA inserted LMA Size: 4.0 Tube type: Oral Number of attempts: 1 Placement Confirmation: positive ETCO2 and breath sounds checked- equal and bilateral Tube secured with: Tape Dental Injury: Teeth and Oropharynx as per pre-operative assessment

## 2015-03-21 NOTE — Anesthesia Postprocedure Evaluation (Signed)
  Anesthesia Post-op Note  Patient: Kenneth Brown  Procedure(s) Performed: Procedure(s): URETEROSCOPY WITH HOLMIUM LASER LITHOTRIPSY,retrograde pyelogram,stent placement (Right)  Anesthesia type:General  Patient location: PACU  Post pain: Pain level controlled  Post assessment: Post-op Vital signs reviewed, Patient's Cardiovascular Status Stable, Respiratory Function Stable, Patent Airway and No signs of Nausea or vomiting  Post vital signs: Reviewed and stable  Last Vitals:  Filed Vitals:   03/21/15 1124  BP: 120/76  Pulse: 71  Temp: 35.9 C  Resp: 18    Level of consciousness: awake, alert  and patient cooperative  Complications: No apparent anesthesia complications

## 2015-03-21 NOTE — Anesthesia Preprocedure Evaluation (Signed)
Anesthesia Evaluation  Patient identified by MRN, date of birth, ID band Patient awake    Reviewed: Allergy & Precautions, NPO status , Patient's Chart, lab work & pertinent test results, reviewed documented beta blocker date and time   Airway Mallampati: III  TM Distance: >3 FB     Dental  (+) Chipped   Pulmonary          Cardiovascular     Neuro/Psych    GI/Hepatic   Endo/Other  diabetesMorbid obesity  Renal/GU Renal InsufficiencyRenal disease     Musculoskeletal   Abdominal   Peds  Hematology   Anesthesia Other Findings   Reproductive/Obstetrics                             Anesthesia Physical Anesthesia Plan  ASA: III  Anesthesia Plan: General   Post-op Pain Management:    Induction: Intravenous  Airway Management Planned: LMA  Additional Equipment:   Intra-op Plan:   Post-operative Plan:   Informed Consent:   Plan Discussed with: CRNA  Anesthesia Plan Comments:         Anesthesia Quick Evaluation

## 2015-03-21 NOTE — Discharge Instructions (Signed)
Drink 2 qts H20 daily  AMBULATORY SURGERY  DISCHARGE INSTRUCTIONS   1) The drugs that you were given will stay in your system until tomorrow so for the next 24 hours you should not:  A) Drive an automobile B) Make any legal decisions C) Drink any alcoholic beverage   2) You may resume regular meals tomorrow.  Today it is better to start with liquids and gradually work up to solid foods.  You may eat anything you prefer, but it is better to start with liquids, then soup and crackers, and gradually work up to solid foods.   3) Please notify your doctor immediately if you have any unusual bleeding, trouble breathing, redness and pain at the surgery site, drainage, fever, or pain not relieved by medication. 4)  5)                                             Please call to schedule your post-operative visit.  6) Additional Instructions:

## 2015-03-21 NOTE — Op Note (Signed)
Preop ureteral calculous Postop renal calculous Procedure  Cysto, right retrograde pyelogram, ureteroscopy stent laser lithotripsy multiple renal calculi Anes: general  With the patient sterile draped,in the supine lithotomy position for ease of approach to the external genitalia we begin the procedure.  A time-out is taken and then with a 21FR Cystoscope shealth we ender the bladder.  30 degree lens is utilized.  Right retrograde is done utilizing a 5fr open ended catheter and omnipaq contrast  No filling defect is seen in the ureter but a calculous is seen in the right lower calyx.  A double lumen ureteral access catheter is put up over a 0.35 sensor wire.  A second wire is put up thru the catheter and then the dilator is removed.  .  A digital scope goes to the kidney.  Multiple calculi are located and disintigrated to a very small size and left in place  The scope and sheath are removed. A 26cm 6 french stent is placed over the wire and the wire removed It is checked for position and the bladder is emptied thru the cystoscope sheath.  58ml of 0.5% marcaine is put in the bladder and sheath is withdrawn.    A60mg  Belladonna and opium suppository is placed in the rectum.  There a normal rectal exam is completed.  The bladder itself showed no turmors, masses or calculi  The patient was sent to the recovery room in satisfactory condition.

## 2015-03-21 NOTE — H&P (Signed)
  Patient has multiple calculi in the right kidney Has had bleeding and sepsis with a bladder calculous Has a small prostate and calculous had passed into the urethra.     Allergies NKDA Past medical hx   Gross Hematuria Renal calculi Urethral calculous passed Acute cystitis resolved Acid reflux [place] Calculi, father Heart Disease.  COPD Mother No tobacco or ETOH Meds Tamsulosin PSH ankle arthroscopy Physical exam  HS :RRR without murmur  Lungs CTA  Abdomen soft BS normal

## 2015-03-22 ENCOUNTER — Encounter: Payer: Self-pay | Admitting: Urology

## 2015-03-29 ENCOUNTER — Encounter: Payer: Self-pay | Admitting: Urology

## 2015-04-11 ENCOUNTER — Encounter: Payer: Self-pay | Admitting: Family Medicine

## 2015-04-11 ENCOUNTER — Ambulatory Visit (INDEPENDENT_AMBULATORY_CARE_PROVIDER_SITE_OTHER): Payer: PPO | Admitting: Family Medicine

## 2015-04-11 VITALS — BP 130/78 | HR 90 | Temp 98.2°F | Resp 18 | Wt 235.0 lb

## 2015-04-11 DIAGNOSIS — E119 Type 2 diabetes mellitus without complications: Secondary | ICD-10-CM | POA: Diagnosis not present

## 2015-04-11 DIAGNOSIS — D696 Thrombocytopenia, unspecified: Secondary | ICD-10-CM | POA: Diagnosis not present

## 2015-04-11 DIAGNOSIS — I10 Essential (primary) hypertension: Secondary | ICD-10-CM

## 2015-04-11 NOTE — Progress Notes (Signed)
Patient ID: Finbar Nippert, male   DOB: October 13, 1951, 64 y.o.   MRN: [id number]   Con Arganbright  MRN: [id number] DOB: 1950/11/22  Subjective:  HPI  1. Type 2 diabetes mellitus without complication Patient is a 64 year old male who presents for follow up of his borderline diabetes.  He has been checking his glucose at home and his readings have ranged from 97-138.  He has not had any hypoglycemic symptoms or events.  His last A1C was on  01/03/15 and was 6.0.  He has not seen an eye doctor in at least 5 years. He is checking his feet on a regular basis.  He is currently controlling his glucose with diet and exercise.  2. Essential hypertension Patient is also here for follow up on his hypertension.  He has been doing his BP at the pharmacy but does not remember the readings exactly but states they have been ok.  He is currently not on any medications.  While reviewing the patient's record today it was noted that his platelet count on 03/08/15 was 59, Creatinine of 1.30 and GFR 56.  Other labs from the CBC and Met B were WNL   Patient Active Problem List   Diagnosis Date Noted  . Abnormal liver enzymes 03/02/2015  . Allergic rhinitis 03/02/2015  . Blood pressure elevated 03/02/2015  . Borderline diabetes 03/02/2015  . History of colon polyps 03/02/2015  . Personal history of traumatic fracture 03/02/2015  . H/O disease 03/02/2015  . H/O renal calculi 03/02/2015  . Personal history of infectious and parasitic disease 03/02/2015  . HLD (hyperlipidemia) 03/02/2015  . Diabetes mellitus, type 2 03/02/2015  . Adiposity 03/02/2015    Past Medical History  Diagnosis Date  . Diabetes mellitus without complication     pre diabetic  . UTI (urinary tract infection) 10/2014    History   Social History  . Marital Status: Married    Spouse Name: N/A  . Number of Children: N/A  . Years of Education: N/A   Occupational History  . Not on file.   Social History Main Topics  .  Smoking status: Never Smoker   . Smokeless tobacco: Not on file  . Alcohol Use: 0.0 - 0.6 oz/week    0-1 Cans of beer per week  . Drug Use: No  . Sexual Activity: Not on file   Other Topics Concern  . Not on file   Social History Narrative    Outpatient Prescriptions Prior to Visit  Medication Sig Dispense Refill  . ciprofloxacin (CIPRO) 500 MG tablet Take 500 mg by mouth every 12 (twelve) hours.    Marland Kitchen glucose blood test strip 1 each by Other route as needed for other. Use as instructed    . oxyCODONE-acetaminophen (ROXICET) 5-325 MG per tablet Take 1 tablet by mouth every 6 (six) hours as needed for severe pain. 30 tablet 0  . oxyCODONE-acetaminophen (PERCOCET/ROXICET) 5-325 MG per tablet Take 1 tablet by mouth every 6 (six) hours as needed for severe pain.    . silodosin (RAPAFLO) 8 MG CAPS capsule Take 8 mg by mouth every morning.     No facility-administered medications prior to visit.    No Known Allergies  Review of Systems  Constitutional: Negative.   Eyes: Negative.   Respiratory: Negative.   Cardiovascular: Positive for leg swelling (Right leg only).  Gastrointestinal: Negative.   Genitourinary: Negative.   Musculoskeletal: Negative.   Skin: Negative.   Neurological: Negative.  Negative for  headaches.  Endo/Heme/Allergies: Negative.   Psychiatric/Behavioral: Negative.    Objective:  BP 130/78 mmHg  Pulse 90  Temp(Src) 98.2 F (36.8 C) (Oral)  Resp 18  Wt 235 lb (106.595 kg)  Physical Exam  Constitutional: He is oriented to person, place, and time and well-developed, well-nourished, and in no distress.  HENT:  Head: Normocephalic and atraumatic.  Right Ear: External ear normal.  Left Ear: External ear normal.  Nose: Nose normal.  Mouth/Throat: Oropharynx is clear and moist.  Eyes: Conjunctivae and EOM are normal. Pupils are equal, round, and reactive to light.  Neck: Normal range of motion. Neck supple.  Cardiovascular: Normal rate, regular rhythm and  normal heart sounds.   Pulmonary/Chest: Effort normal and breath sounds normal.  Neurological: He is alert and oriented to person, place, and time. Gait normal.  Skin: Skin is warm and dry.  Psychiatric: Mood, memory, affect and judgment normal.    Assessment and Plan :  1. Type 2 diabetes mellitus without complication  - HgB K1Q  2. Essential hypertension  - CBC with Differential - COMPLETE METABOLIC PANEL WITH GFR   3. Thrombocytopenia If platelets below 90,000 refPlateleterred to hematology  Miguel Aschoff MD [facility] 04/11/2015 4:23 PM

## 2015-04-12 LAB — CBC WITH DIFFERENTIAL/PLATELET
BASOS: 0 %
Basophils Absolute: 0 10*3/uL (ref 0.0–0.2)
EOS (ABSOLUTE): 0.1 10*3/uL (ref 0.0–0.4)
EOS: 2 %
HEMATOCRIT: 46 % (ref 37.5–51.0)
HEMOGLOBIN: 15.4 g/dL (ref 12.6–17.7)
Immature Grans (Abs): 0 10*3/uL (ref 0.0–0.1)
Immature Granulocytes: 0 %
Lymphocytes Absolute: 1 10*3/uL (ref 0.7–3.1)
Lymphs: 18 %
MCH: 29.4 pg (ref 26.6–33.0)
MCHC: 33.5 g/dL (ref 31.5–35.7)
MCV: 88 fL (ref 79–97)
Monocytes Absolute: 0.2 10*3/uL (ref 0.1–0.9)
Monocytes: 4 %
NEUTROS PCT: 76 %
Neutrophils Absolute: 4 10*3/uL (ref 1.4–7.0)
Platelets: 78 10*3/uL — CL (ref 150–379)
RBC: 5.24 x10E6/uL (ref 4.14–5.80)
RDW: 15.1 % (ref 12.3–15.4)
WBC: 5.3 10*3/uL (ref 3.4–10.8)

## 2015-04-12 LAB — CMP14+EGFR
A/G RATIO: 1.8 (ref 1.1–2.5)
ALT: 26 IU/L (ref 0–44)
AST: 30 IU/L (ref 0–40)
Albumin: 4.3 g/dL (ref 3.6–4.8)
Alkaline Phosphatase: 79 IU/L (ref 39–117)
BUN/Creatinine Ratio: 12 (ref 10–22)
BUN: 14 mg/dL (ref 8–27)
Bilirubin Total: 1 mg/dL (ref 0.0–1.2)
CO2: 22 mmol/L (ref 18–29)
CREATININE: 1.17 mg/dL (ref 0.76–1.27)
Calcium: 9.4 mg/dL (ref 8.6–10.2)
Chloride: 105 mmol/L (ref 97–108)
GFR calc Af Amer: 75 mL/min/{1.73_m2} (ref >59)
GFR, EST NON AFRICAN AMERICAN: 65 mL/min/{1.73_m2} (ref >59)
GLUCOSE: 155 mg/dL — AB (ref 65–99)
Globulin, Total: 2.4 g/dL (ref 1.5–4.5)
Potassium: 4 mmol/L (ref 3.5–5.2)
SODIUM: 142 mmol/L (ref 134–144)
Total Protein: 6.7 g/dL (ref 6.0–8.5)

## 2015-04-12 LAB — HEMOGLOBIN A1C
Est. average glucose Bld gHb Est-mCnc: 128 mg/dL
HEMOGLOBIN A1C: 6.1 % — AB (ref 4.8–5.6)

## 2015-04-17 DIAGNOSIS — D696 Thrombocytopenia, unspecified: Secondary | ICD-10-CM | POA: Insufficient documentation

## 2015-04-17 NOTE — Addendum Note (Signed)
Addended by: Miguel Aschoff on: 04/17/2015 03:59 PM   Modules accepted: Orders, SmartSet

## 2015-04-18 ENCOUNTER — Other Ambulatory Visit: Payer: Self-pay | Admitting: Urology

## 2015-04-25 ENCOUNTER — Ambulatory Visit: Payer: PPO | Admitting: Hematology and Oncology

## 2015-05-06 ENCOUNTER — Inpatient Hospital Stay: Payer: PPO | Attending: Hematology and Oncology | Admitting: Hematology and Oncology

## 2015-05-06 ENCOUNTER — Inpatient Hospital Stay: Payer: PPO

## 2015-05-06 ENCOUNTER — Encounter: Payer: Self-pay | Admitting: Hematology and Oncology

## 2015-05-06 VITALS — BP 126/80 | HR 76 | Temp 97.8°F | Resp 18 | Ht 70.0 in | Wt 235.7 lb

## 2015-05-06 DIAGNOSIS — E119 Type 2 diabetes mellitus without complications: Secondary | ICD-10-CM | POA: Diagnosis not present

## 2015-05-06 DIAGNOSIS — Z87440 Personal history of urinary (tract) infections: Secondary | ICD-10-CM | POA: Diagnosis not present

## 2015-05-06 DIAGNOSIS — D696 Thrombocytopenia, unspecified: Secondary | ICD-10-CM

## 2015-05-06 DIAGNOSIS — R161 Splenomegaly, not elsewhere classified: Secondary | ICD-10-CM | POA: Diagnosis not present

## 2015-05-06 DIAGNOSIS — Z801 Family history of malignant neoplasm of trachea, bronchus and lung: Secondary | ICD-10-CM | POA: Diagnosis not present

## 2015-05-06 DIAGNOSIS — Z8781 Personal history of (healed) traumatic fracture: Secondary | ICD-10-CM | POA: Diagnosis not present

## 2015-05-06 DIAGNOSIS — Z808 Family history of malignant neoplasm of other organs or systems: Secondary | ICD-10-CM | POA: Diagnosis not present

## 2015-05-06 DIAGNOSIS — E538 Deficiency of other specified B group vitamins: Secondary | ICD-10-CM | POA: Insufficient documentation

## 2015-05-06 DIAGNOSIS — Z79899 Other long term (current) drug therapy: Secondary | ICD-10-CM | POA: Diagnosis not present

## 2015-05-06 DIAGNOSIS — K7460 Unspecified cirrhosis of liver: Secondary | ICD-10-CM

## 2015-05-06 DIAGNOSIS — Z87442 Personal history of urinary calculi: Secondary | ICD-10-CM | POA: Diagnosis not present

## 2015-05-06 LAB — FOLATE: Folate: 11.1 ng/mL (ref >5.9)

## 2015-05-06 LAB — CBC WITH DIFFERENTIAL/PLATELET
Basophils Absolute: 0 10*3/uL (ref 0–0.1)
Basophils Relative: 1 %
Eosinophils Absolute: 0.2 10*3/uL (ref 0–0.7)
Eosinophils Relative: 3 %
HCT: 48.3 % (ref 40.0–52.0)
Hemoglobin: 16.1 g/dL (ref 13.0–18.0)
Lymphocytes Relative: 19 %
Lymphs Abs: 1.1 10*3/uL (ref 1.0–3.6)
MCH: 29.3 pg (ref 26.0–34.0)
MCHC: 33.3 g/dL (ref 32.0–36.0)
MCV: 88 fL (ref 80.0–100.0)
Monocytes Absolute: 0.3 10*3/uL (ref 0.2–1.0)
Monocytes Relative: 6 %
Neutro Abs: 3.9 10*3/uL (ref 1.4–6.5)
Neutrophils Relative %: 71 %
Platelets: 67 10*3/uL — ABNORMAL LOW (ref 150–440)
RBC: 5.49 MIL/uL (ref 4.40–5.90)
RDW: 15.5 % — ABNORMAL HIGH (ref 11.5–14.5)
WBC: 5.5 10*3/uL (ref 3.8–10.6)

## 2015-05-06 LAB — LACTATE DEHYDROGENASE: LDH: 177 U/L (ref 98–192)

## 2015-05-06 LAB — TSH: TSH: 2.528 u[IU]/mL (ref 0.350–4.500)

## 2015-05-06 LAB — VITAMIN B12: Vitamin B-12: 142 pg/mL — ABNORMAL LOW (ref 180–914)

## 2015-05-06 LAB — URIC ACID: Uric Acid, Serum: 7.6 mg/dL (ref 4.4–7.6)

## 2015-05-06 NOTE — Progress Notes (Signed)
[facility] day:  05/06/2015  Chief Complaint: Kenneth Brown is a 64 y.o. male with thrombocytopenia who is referred in consultation by Dr. Rosanna Randy.  HPI: The patient denies any significant past medical problems.  He does note that from 10/2014 until 02/2015 that he was on antibiotics continuously.  He describes having a UTI, kidney and bladder stones.  A stent was placed.  He has been on sulfa and ciprofloxacin.  He denies any problems with bruising or bleeding except in 2013 when he developed rectal bleeding after taking naprosyn for 9 months secondary to his ankle.    He states that his physician took blood samples in 10/2014.  He was told that he had low platelets.  Repeat labs in 01/2015, noted improvement.  Review of labs from 08/30/2012 revealed a platelet count of 113,000 (around the time of surgery on his ankle). Additional labs reveal a platelet count of 119,000 2 years ago, 74,000 4 months ago, 59,000 1 month ago, and 78,000 3 weeks ago.  Additional labs 3 weeks ago revealed a hematocrit of 46, hemoglobin 15.4, MCV 88, and WBC 5300 with an ANC of 4000.  Differential was unremarkable.   Abdominal and pelvic CT scan on 01/14/2015 revealed moderate splenomegaly (1660 cc) and nodular hepatic contour possibly representing cirrhosis.  There were multiple non-obstructing bilateral renal calculi.  He denies any new medications, herbal products, use of quinine water.  His diet is excellent.  He denies any other infections.  He denies any family history of blood disorder or autoimmune disease.  Symptomatically he feels good.  He denies any B symptoms.  Past Medical History  Diagnosis Date  . UTI (urinary tract infection) 10/2014  . Diabetes mellitus without complication     pre diabetic    Past Surgical History  Procedure Laterality Date  . Ankle surgery  08/28/12  . Orif ankle fracture Right     plate and screws, pt still has  .  Ureteroscopy with holmium laser lithotripsy Right 03/21/2015    Procedure: URETEROSCOPY WITH HOLMIUM LASER LITHOTRIPSY,retrograde pyelogram,stent placement;  Surgeon: Collier Flowers, MD;  Location: [facility] ORS;  Service: Urology;  Laterality: Right;    Family History  Problem Relation Age of Onset  . Hypertension Mother   . Heart disease Mother   . Stroke Mother   . COPD Mother   . Heart disease Father   . Heart attack Father   . Lung cancer Father   . Skin cancer Father     Social History:  reports that he has never smoked. He does not have any smokeless tobacco history on file. He reports that he drinks alcohol. He reports that he does not use illicit drugs.  The patient is accompanied by his wife and grand-daughter, Kenneth Brown, today.  Allergies: No Known Allergies  Current Medications: Current Outpatient Prescriptions  Medication Sig Dispense Refill  . glucose blood test strip 1 each by Other route as needed for other. Use as instructed    . oxyCODONE-acetaminophen (PERCOCET/ROXICET) 5-325 MG per tablet Take 1 tablet by mouth every 6 (six) hours as needed for severe pain.    Marland Kitchen oxyCODONE-acetaminophen (ROXICET) 5-325 MG per tablet Take 1 tablet by mouth every 6 (six) hours as needed for severe pain. 30 tablet 0  . ciprofloxacin (CIPRO) 500 MG tablet Take 500 mg by mouth every 12 (twelve) hours.     No current facility-administered medications for this visit.    Review of  Systems:  GENERAL:  Feels good.  Active.  No fevers, sweats or weight loss. PERFORMANCE STATUS (ECOG):  0 HEENT:  Runny nose.  No visual changes, sore throat, mouth sores or tenderness. Lungs: No shortness of breath or cough.  No hemoptysis. Cardiac:  No chest pain, palpitations, orthopnea, or PND. GI:  No nausea, vomiting, diarrhea, constipation, melena or hematochezia.  Colonoscopy 2015. GU:  No urgency, frequency, dysuria, or hematuria. Musculoskeletal:  No back pain.  Ankle pain.  No muscle  tenderness. Extremities:  No pain or swelling. Skin:  No rashes or skin changes. Neuro:  No headache, numbness or weakness, balance or coordination issues. Endocrine:  No diabetes, thyroid issues, hot flashes or night sweats. Psych:  No mood changes, depression or anxiety. Pain:  No focal pain. Review of systems:  All other systems reviewed and found to be negative.   Physical Exam: Blood pressure 126/80, pulse 76, temperature 97.8 F (36.6 C), temperature source Oral, resp. rate 18, height 5\' 10"  (1.778 m), weight 235 lb 10.8 oz (106.9 kg). GENERAL:  Well developed, well nourished, sitting comfortably in the exam room in no acute distress. MENTAL STATUS:  Alert and oriented to person, place and time. HEAD:  Pearline Cables hair.  Normocephalic, atraumatic, face symmetric, no Cushingoid features. EYES:  Blue eyes.  Pupils equal round and reactive to light and accomodation.  No conjunctivitis or scleral icterus. ENT:  Oropharynx clear without lesion.  Tongue normal. Mucous membranes moist.  RESPIRATORY:  Clear to auscultation without rales, wheezes or rhonchi. CARDIOVASCULAR:  Regular rate and rhythm without murmur, rub or gallop. ABDOMEN:  Soft, non-tender, with active bowel sounds, and no hepatomegaly.  Spleen tip palpable.  No masses. SKIN:  No rashes, ulcers or lesions. EXTREMITIES: No edema, no skin discoloration or tenderness.  No palpable cords. LYMPH NODES: No palpable cervical, supraclavicular, axillary or inguinal adenopathy  NEUROLOGICAL: Unremarkable. PSYCH:  Appropriate.   No visits with results within 3 Day(s) from this visit. Latest known visit with results is:  Office Visit on 04/11/2015  Component Date Value Ref Range Status  . Hgb A1c MFr Bld 04/11/2015 6.1* 4.8 - 5.6 % Final   Comment:          Pre-diabetes: 5.7 - 6.4          Diabetes: >6.4          Glycemic control for adults with diabetes: <7.0   . Est. average glucose Bld gHb Est-m* 04/11/2015 128   Final  . WBC  04/11/2015 5.3  3.4 - 10.8 x10E3/uL Final  . RBC 04/11/2015 5.24  4.14 - 5.80 x10E6/uL Final  . Hemoglobin 04/11/2015 15.4  12.6 - 17.7 g/dL Final  . Hematocrit 04/11/2015 46.0  37.5 - 51.0 % Final  . MCV 04/11/2015 88  79 - 97 fL Final  . MCH 04/11/2015 29.4  26.6 - 33.0 pg Final  . MCHC 04/11/2015 33.5  31.5 - 35.7 g/dL Final  . RDW 04/11/2015 15.1  12.3 - 15.4 % Final  . Platelets 04/11/2015 78* 150 - 379 x10E3/uL Final   Comment: Actual platelet count may be somewhat higher than reported due to aggregation of platelets in this sample.   Marland Kitchen NEUTROPHILS 04/11/2015 76   Final  . Lymphs 04/11/2015 18   Final  . Monocytes 04/11/2015 4   Final  . Eos 04/11/2015 2   Final  . Basos 04/11/2015 0   Final  . Neutrophils Absolute 04/11/2015 4.0  1.4 - 7.0 x10E3/uL Final  .  Lymphocytes Absolute 04/11/2015 1.0  0.7 - 3.1 x10E3/uL Final  . Monocytes Absolute 04/11/2015 0.2  0.1 - 0.9 x10E3/uL Final  . EOS (ABSOLUTE) 04/11/2015 0.1  0.0 - 0.4 x10E3/uL Final  . Basophils Absolute 04/11/2015 0.0  0.0 - 0.2 x10E3/uL Final  . Immature Granulocytes 04/11/2015 0   Final  . Immature Grans (Abs) 04/11/2015 0.0  0.0 - 0.1 x10E3/uL Final  . Hematology Comments: 04/11/2015 Note:   Final   Verified by microscopic examination.  . Glucose 04/11/2015 155* 65 - 99 mg/dL Final  . BUN 04/11/2015 14  8 - 27 mg/dL Final  . Creatinine, Ser 04/11/2015 1.17  0.76 - 1.27 mg/dL Final  . GFR calc non Af Amer 04/11/2015 65  >59 mL/min/1.73 Final  . GFR calc Af Amer 04/11/2015 75  >59 mL/min/1.73 Final  . BUN/Creatinine Ratio 04/11/2015 12  10 - 22 Final  . Sodium 04/11/2015 142  134 - 144 mmol/L Final  . Potassium 04/11/2015 4.0  3.5 - 5.2 mmol/L Final  . Chloride 04/11/2015 105  97 - 108 mmol/L Final  . CO2 04/11/2015 22  18 - 29 mmol/L Final  . Calcium 04/11/2015 9.4  8.6 - 10.2 mg/dL Final  . Total Protein 04/11/2015 6.7  6.0 - 8.5 g/dL Final  . Albumin 04/11/2015 4.3  3.6 - 4.8 g/dL Final  . Globulin, Total  04/11/2015 2.4  1.5 - 4.5 g/dL Final  . Albumin/Globulin Ratio 04/11/2015 1.8  1.1 - 2.5 Final  . Bilirubin Total 04/11/2015 1.0  0.0 - 1.2 mg/dL Final  . Alkaline Phosphatase 04/11/2015 79  39 - 117 IU/L Final  . AST 04/11/2015 30  0 - 40 IU/L Final  . ALT 04/11/2015 26  0 - 44 IU/L Final    Assessment:  Deontray Hunnicutt is a 64 y.o. male with a history of mild thrombocytopenia dating back to 2013. Platelet count over the past 6 months has ranged between 59,000 and 74,000 without trend. Abdominal and pelvic CT scan on 01/14/2015 revealed a nodular liver and moderate splenomegaly (18 cm).  He denies any new medications or herbal products. He has been on a course of Septra.   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   Plan: 1. Review differential diagnosis of thrombocytopenia.  Patient previously on Septra which is a myelosuppresant.  No other medications or herbal products indicated.  Doubt connective tissue disorder.  Suspect cirrhosis leading to portal hypertension and resultant splenomegaly and platelet sequestration.  Differential also includes immune mediated thrombocytopenic purpura (ITP) given waxing and waning platelet count without trend. However, ITP is not associated with splenomegaly.  Given splenomegaly, possibility of low grade splenic lymphoma.  Discuss obtaining baseline labs.  If platelet count drifts to 30,000-50,000 range will require a bone marrow aspirate and biopsy.  2. Labs today:  CBC with diff (blue top tube)- r/o pseudothrombocytopenia; lupus anticoagulant, B12, folate, TSH, LDH, uric acid, SPEP, hepatitis B surface antigen, hepatitis B core antibody total, HIV testing (patient consented), and ANA with reflex. 3. Consult Dr. Allen Norris (GI), patient known to him, for evaluation of possible liver cirrhosis and splenomegaly. 4. RTC in 2 weeks for review of work-up and discussion  regarding direction of therapy.   Lequita Asal, MD  05/06/2015, 11:37 AM

## 2015-05-07 ENCOUNTER — Other Ambulatory Visit: Payer: Self-pay | Admitting: Hematology and Oncology

## 2015-05-07 DIAGNOSIS — E538 Deficiency of other specified B group vitamins: Secondary | ICD-10-CM | POA: Insufficient documentation

## 2015-05-07 LAB — ANA W/REFLEX: Anti Nuclear Antibody(ANA): NEGATIVE

## 2015-05-07 LAB — HEPATITIS B CORE ANTIBODY, TOTAL: Hep B Core Total Ab: NEGATIVE

## 2015-05-07 LAB — HIV ANTIBODY (ROUTINE TESTING W REFLEX): HIV Screen 4th Generation wRfx: NONREACTIVE

## 2015-05-07 LAB — HEPATITIS B SURFACE ANTIGEN: Hepatitis B Surface Ag: NEGATIVE

## 2015-05-07 LAB — HEPATITIS C ANTIBODY: HCV Ab: <0.1 s/co ratio (ref 0.0–0.9)

## 2015-05-09 ENCOUNTER — Telehealth: Payer: Self-pay

## 2015-05-09 ENCOUNTER — Ambulatory Visit: Payer: Self-pay | Admitting: Family Medicine

## 2015-05-09 LAB — PROTEIN ELECTROPHORESIS, SERUM
A/G Ratio: 1.4 (ref 0.7–1.7)
Albumin ELP: 3.9 g/dL (ref 2.9–4.4)
Alpha-1-Globulin: 0.2 g/dL (ref 0.0–0.4)
Alpha-2-Globulin: 0.7 g/dL (ref 0.4–1.0)
Beta Globulin: 1 g/dL (ref 0.7–1.3)
Gamma Globulin: 0.9 g/dL (ref 0.4–1.8)
Globulin, Total: 2.8 g/dL (ref 2.2–3.9)
Total Protein ELP: 6.7 g/dL (ref 6.0–8.5)

## 2015-05-09 NOTE — Telephone Encounter (Signed)
Pt notified of low B 12 and need to start B 12 injections weekly x 6 then monthly; pt verbalized understanding of this and knows scheduling will contact pt to set up appointments

## 2015-05-11 ENCOUNTER — Inpatient Hospital Stay: Payer: PPO

## 2015-05-11 DIAGNOSIS — D696 Thrombocytopenia, unspecified: Secondary | ICD-10-CM | POA: Diagnosis not present

## 2015-05-11 DIAGNOSIS — E538 Deficiency of other specified B group vitamins: Secondary | ICD-10-CM

## 2015-05-11 MED ORDER — CYANOCOBALAMIN 1000 MCG/ML IJ SOLN
1000.0000 ug | Freq: Once | INTRAMUSCULAR | Status: AC
  Administered 2015-05-11: 1000 ug via INTRAMUSCULAR
  Filled 2015-05-11: qty 1

## 2015-05-12 LAB — LUPUS ANTICOAGULANT PANEL
DRVVT: 43.5 s (ref 0.0–55.1)
PTT Lupus Anticoagulant: 41.3 s (ref 0.0–50.0)

## 2015-05-20 ENCOUNTER — Inpatient Hospital Stay: Payer: PPO

## 2015-05-20 ENCOUNTER — Inpatient Hospital Stay (HOSPITAL_BASED_OUTPATIENT_CLINIC_OR_DEPARTMENT_OTHER): Payer: PPO | Admitting: Hematology and Oncology

## 2015-05-20 VITALS — BP 137/80 | HR 79 | Temp 97.5°F | Resp 18 | Ht 70.0 in | Wt 238.5 lb

## 2015-05-20 DIAGNOSIS — R161 Splenomegaly, not elsewhere classified: Secondary | ICD-10-CM | POA: Diagnosis not present

## 2015-05-20 DIAGNOSIS — Z801 Family history of malignant neoplasm of trachea, bronchus and lung: Secondary | ICD-10-CM

## 2015-05-20 DIAGNOSIS — Z79899 Other long term (current) drug therapy: Secondary | ICD-10-CM | POA: Diagnosis not present

## 2015-05-20 DIAGNOSIS — E538 Deficiency of other specified B group vitamins: Secondary | ICD-10-CM

## 2015-05-20 DIAGNOSIS — Z87440 Personal history of urinary (tract) infections: Secondary | ICD-10-CM

## 2015-05-20 DIAGNOSIS — D696 Thrombocytopenia, unspecified: Secondary | ICD-10-CM

## 2015-05-20 DIAGNOSIS — Z8781 Personal history of (healed) traumatic fracture: Secondary | ICD-10-CM

## 2015-05-20 DIAGNOSIS — Z808 Family history of malignant neoplasm of other organs or systems: Secondary | ICD-10-CM

## 2015-05-20 DIAGNOSIS — E119 Type 2 diabetes mellitus without complications: Secondary | ICD-10-CM

## 2015-05-20 DIAGNOSIS — Z87442 Personal history of urinary calculi: Secondary | ICD-10-CM

## 2015-05-20 MED ORDER — CYANOCOBALAMIN 1000 MCG/ML IJ SOLN
1000.0000 ug | Freq: Once | INTRAMUSCULAR | Status: AC
  Administered 2015-05-20: 1000 ug via INTRAMUSCULAR
  Filled 2015-05-20: qty 1

## 2015-05-20 NOTE — Progress Notes (Signed)
[facility] day:  05/20/2015  Chief Complaint: Kenneth Brown is a 64 y.o. male with thrombocytopenia who is seen for review of work-up and discussion regarding direction of therapy.  HPI: The patient was last seen in the medical oncology clinic on 05/06/2015.  At that time, he was seen for initial consultation regarding thrombocytopenia. Platelet count in 2013 was 113,000.  Platelet count over the past 6 months had ranged between 59,000 and 74,000 without trend.  Scans from 12/2014 had noted a nodular liver and splenomegaly (18 cm).  He denied any new medications or herbal products.  He had been on a course of Septra.  He felt good.  Exam was unremarkable.  He underwent a work-up on 05/06/2015.  CBC revealed a hematocrit of 48.3, hemoglobin 16.1, platelets 67,000, WBC 5500 with an ANC of 3900.  Differential was unremarkable.  B12 was 142 (low).  Folate was 11.1 (normal).  Hepatitis B core antibody total, hepatitis B surface antigen, heaptitis C antibody, and HIV testing was negative.  SPEP was negative.  ANA was negative.  Lupus anticoagulant was negative.  LDH and TSH were normal.  Symptomatically he feels good.  He denies any B symptoms.  Past Medical History  Diagnosis Date  . UTI (urinary tract infection) 10/2014  . Diabetes mellitus without complication     pre diabetic    Past Surgical History  Procedure Laterality Date  . Ankle surgery  08/28/12  . Orif ankle fracture Right     plate and screws, pt still has  . Ureteroscopy with holmium laser lithotripsy Right 03/21/2015    Procedure: URETEROSCOPY WITH HOLMIUM LASER LITHOTRIPSY,retrograde pyelogram,stent placement;  Surgeon: Collier Flowers, MD;  Location: [facility] ORS;  Service: Urology;  Laterality: Right;    Family History  Problem Relation Age of Onset  . Hypertension Mother   . Heart disease Mother   . Stroke Mother   . COPD Mother   . Heart disease Father   . Heart attack  Father   . Lung cancer Father   . Skin cancer Father     Social History:  reports that he has never smoked. He does not have any smokeless tobacco history on file. He reports that he drinks alcohol. He reports that he does not use illicit drugs.  He is accompanied by his wife.  Allergies: No Known Allergies  Current Medications: Current Outpatient Prescriptions  Medication Sig Dispense Refill  . ciprofloxacin (CIPRO) 500 MG tablet Take 500 mg by mouth every 12 (twelve) hours.    Marland Kitchen glucose blood test strip 1 each by Other route as needed for other. Use as instructed    . oxyCODONE-acetaminophen (PERCOCET/ROXICET) 5-325 MG per tablet Take 1 tablet by mouth every 6 (six) hours as needed for severe pain.    Marland Kitchen oxyCODONE-acetaminophen (ROXICET) 5-325 MG per tablet Take 1 tablet by mouth every 6 (six) hours as needed for severe pain. 30 tablet 0   No current facility-administered medications for this visit.   Review of Systems:  GENERAL:  Feels good.  Active.  No fevers, sweats or weight loss. PERFORMANCE STATUS (ECOG):  0 HEENT:  No visual changes, sore throat, mouth sores or tenderness. Lungs: No shortness of breath or cough.  No hemoptysis. Cardiac:  No chest pain, palpitations, orthopnea, or PND. GI:  No nausea, vomiting, diarrhea, constipation, melena or hematochezia.  Colonoscopy 2015. GU:  No urgency, frequency, dysuria, or hematuria. Musculoskeletal:  No back pain.  Ankle pain.  No muscle tenderness. Extremities:  No pain or swelling. Skin:  No rashes or skin changes. Neuro:  No headache, numbness or weakness, balance or coordination issues. Endocrine:  No diabetes, thyroid issues, hot flashes or night sweats. Psych:  No mood changes, depression or anxiety. Pain:  No focal pain. Review of systems:  All other systems reviewed and found to be negative.   Physical Exam: There were no vitals taken for this visit. GENERAL:  Well developed, well nourished, sitting comfortably in the  exam room in no acute distress. MENTAL STATUS:  Alert and oriented to person, place and time. HEAD:  Pearline Cables hair.  Normocephalic, atraumatic, face symmetric, no Cushingoid features. EYES:  Blue eyes.  No conjunctivitis or scleral icterus. PSYCH:  Appropriate.   No visits with results within 3 Day(s) from this visit. Latest known visit with results is:  Appointment on 05/06/2015  Component Date Value Ref Range Status  . WBC 05/06/2015 5.5  3.8 - 10.6 K/uL Final  . RBC 05/06/2015 5.49  4.40 - 5.90 MIL/uL Final  . Hemoglobin 05/06/2015 16.1  13.0 - 18.0 g/dL Final  . HCT 05/06/2015 48.3  40.0 - 52.0 % Final  . MCV 05/06/2015 88.0  80.0 - 100.0 fL Final  . MCH 05/06/2015 29.3  26.0 - 34.0 pg Final  . MCHC 05/06/2015 33.3  32.0 - 36.0 g/dL Final  . RDW 05/06/2015 15.5* 11.5 - 14.5 % Final  . Platelets 05/06/2015 67* 150 - 440 K/uL Final   PLATELET COUNT CONFIRMED WITH CITRATED BLOOD  . Neutrophils Relative % 05/06/2015 71   Final  . Neutro Abs 05/06/2015 3.9  1.4 - 6.5 K/uL Final  . Lymphocytes Relative 05/06/2015 19   Final  . Lymphs Abs 05/06/2015 1.1  1.0 - 3.6 K/uL Final  . Monocytes Relative 05/06/2015 6   Final  . Monocytes Absolute 05/06/2015 0.3  0.2 - 1.0 K/uL Final  . Eosinophils Relative 05/06/2015 3   Final  . Eosinophils Absolute 05/06/2015 0.2  0 - 0.7 K/uL Final  . Basophils Relative 05/06/2015 1   Final  . Basophils Absolute 05/06/2015 0.0  0 - 0.1 K/uL Final  . Total Protein ELP 05/06/2015 6.7  6.0 - 8.5 g/dL Final  . Albumin ELP 05/06/2015 3.9  2.9 - 4.4 g/dL Final  . Alpha-1-Globulin 05/06/2015 0.2  0.0 - 0.4 g/dL Final  . Alpha-2-Globulin 05/06/2015 0.7  0.4 - 1.0 g/dL Final  . Beta Globulin 05/06/2015 1.0  0.7 - 1.3 g/dL Final  . Gamma Globulin 05/06/2015 0.9  0.4 - 1.8 g/dL Final  . M-Spike, % 05/06/2015 Not Observed  Not Observed g/dL Final  . SPE Interp. 05/06/2015 Comment   Final   Comment: (NOTE) The SPE pattern appears essentially unremarkable. Evidence  of monoclonal protein is not apparent. Performed At: [facility] [place], [place] [id number] Lindon Romp MD RC:[id number]   . Comment 05/06/2015 Comment   Final   Comment: (NOTE) Protein electrophoresis scan will follow via computer, mail, or courier delivery.   Marland Kitchen GLOBULIN, TOTAL 05/06/2015 2.8  2.2 - 3.9 g/dL Corrected  . A/G Ratio 05/06/2015 1.4  0.7 - 1.7 Corrected  . Anit Nuclear Antibody(ANA) 05/06/2015 Negative  Negative Final   Comment: (NOTE) Performed At: [facility] [place], [place] [id number] Lindon Romp MD OE:[id number]   . PTT Lupus Anticoagulant 05/06/2015 41.3  0.0 - 50.0 sec Final  . DRVVT 05/06/2015 43.5  0.0 - 55.1 sec  Final  . Lupus Anticoag Interp 05/06/2015 Comment:   Corrected   Comment: (NOTE) No lupus anticoagulant was detected. Performed At: [facility] [place], [place] [id number] Lindon Romp MD QZ:[id number]   . HIV Screen 4th Generation wRfx 05/06/2015 Non Reactive  Non Reactive Final   Comment: (NOTE) Performed At: [facility] [place], [place] [id number] Lindon Romp MD GY:[id number]   . Hep B Core Total Ab 05/06/2015 Negative  Negative Final   Comment: (NOTE) Performed At: [facility] [place], [place] [id number] Lindon Romp MD IO:[id number]   . HCV Ab 05/06/2015 <0.1  0.0 - 0.9 s/co ratio Final   Comment: (NOTE)                                  Negative:     < 0.8                             Indeterminate: 0.8 - 0.9                                  Positive:     > 0.9 The CDC recommends that a positive HCV antibody result be followed up with a HCV Nucleic Acid Amplification test (845364). Performed At: [facility] [place], [place] [id number] Lindon Romp MD MG:[id number]   . LDH 05/06/2015 177  98 - 192 U/L Final  . Vitamin B-12 05/06/2015 142* 180 - 914 pg/mL Final    Comment: (NOTE) This assay is not validated for testing neonatal or myeloproliferative syndrome specimens for Vitamin B12 levels. Performed at [facility]   . Folate 05/06/2015 11.1  >5.9 ng/mL Final  . TSH 05/06/2015 2.528  0.350 - 4.500 uIU/mL Final  . Hepatitis B Surface Ag 05/06/2015 Negative  Negative Final   Comment: (NOTE) Performed At: [facility] [address] [place], [place] [id number] Lindon Romp MD UE:[id number]     Assessment:  Kenneth Brown is a 64 y.o. male with a history of mild thrombocytopenia dating back to 2013.  Platelet count over the past 6 months has ranged between 59,000 and 74,000 without trend.  Abdominal and pelvic CT scan on 01/14/2015 revealed a nodular liver and moderate splenomegaly (18 cm).  He denied any new medications or herbal products.  He had been on a course of Septra.    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   Work-up on 05/06/2015 revealed B12 deficiency.  CBC revealed a hematocrit of 48.3, hemoglobin 16.1, platelets 67,000, WBC 5500 with an ANC of 3900.  Differential was unremarkable.  B12 was 142 (low).  Folate was 11.1 (normal).  Hepatitis B core antibody total, hepatitis B surface antigen, hepatitis C antibody, and HIV testing was negative.  SPEP, ANA, lupus anticoagulant, TSH, and LDH were normal.  Symptomatically, he feels well.  Exam reveals a palpable spleen.  Plan: 1. Review work-up and diagnosis of B12 deficiency. 2. Begin B12 weekly x 6 then monthly. 3. Check CBC in 1 week. 4. Consult Dr. Allen Norris (GI), patient known to him, for evaluation of possible liver cirrhosis and splenomegaly. 5. Discuss plan for bone marrow is splenomegaly not felt  secondary to cirrhosis, or decreasing platelet count. 6. RTC in 8 weeks for MD assess and labs (CBC with diff, B12).   Lequita Asal, MD  05/20/2015, 11:49 AM

## 2015-05-21 ENCOUNTER — Encounter: Payer: Self-pay | Admitting: Hematology and Oncology

## 2015-05-27 ENCOUNTER — Inpatient Hospital Stay: Payer: PPO

## 2015-05-27 DIAGNOSIS — D696 Thrombocytopenia, unspecified: Secondary | ICD-10-CM | POA: Diagnosis not present

## 2015-05-27 DIAGNOSIS — R161 Splenomegaly, not elsewhere classified: Secondary | ICD-10-CM

## 2015-05-27 DIAGNOSIS — E538 Deficiency of other specified B group vitamins: Secondary | ICD-10-CM

## 2015-05-27 LAB — CBC WITH DIFFERENTIAL/PLATELET
Basophils Absolute: 0 10*3/uL (ref 0–0.1)
Basophils Relative: 1 %
Eosinophils Absolute: 0.2 10*3/uL (ref 0–0.7)
Eosinophils Relative: 3 %
HCT: 47.7 % (ref 40.0–52.0)
Hemoglobin: 15.9 g/dL (ref 13.0–18.0)
Lymphocytes Relative: 17 %
Lymphs Abs: 1 10*3/uL (ref 1.0–3.6)
MCH: 29.5 pg (ref 26.0–34.0)
MCHC: 33.3 g/dL (ref 32.0–36.0)
MCV: 88.5 fL (ref 80.0–100.0)
Monocytes Absolute: 0.5 10*3/uL (ref 0.2–1.0)
Monocytes Relative: 8 %
Neutro Abs: 4.2 10*3/uL (ref 1.4–6.5)
Neutrophils Relative %: 71 %
Platelets: 80 10*3/uL — ABNORMAL LOW (ref 150–440)
RBC: 5.39 MIL/uL (ref 4.40–5.90)
RDW: 15.2 % — ABNORMAL HIGH (ref 11.5–14.5)
WBC: 5.8 10*3/uL (ref 3.8–10.6)

## 2015-05-27 MED ORDER — CYANOCOBALAMIN 1000 MCG/ML IJ SOLN
1000.0000 ug | Freq: Once | INTRAMUSCULAR | Status: AC
  Administered 2015-05-27: 1000 ug via INTRAMUSCULAR
  Filled 2015-05-27: qty 1

## 2015-06-03 ENCOUNTER — Inpatient Hospital Stay: Payer: PPO | Attending: Hematology and Oncology

## 2015-06-03 DIAGNOSIS — E538 Deficiency of other specified B group vitamins: Secondary | ICD-10-CM | POA: Insufficient documentation

## 2015-06-03 DIAGNOSIS — Z79899 Other long term (current) drug therapy: Secondary | ICD-10-CM | POA: Insufficient documentation

## 2015-06-03 MED ORDER — CYANOCOBALAMIN 1000 MCG/ML IJ SOLN
1000.0000 ug | Freq: Once | INTRAMUSCULAR | Status: AC
  Administered 2015-06-03: 1000 ug via INTRAMUSCULAR
  Filled 2015-06-03: qty 1

## 2015-06-10 ENCOUNTER — Inpatient Hospital Stay: Payer: PPO

## 2015-06-10 DIAGNOSIS — E538 Deficiency of other specified B group vitamins: Secondary | ICD-10-CM

## 2015-06-10 MED ORDER — CYANOCOBALAMIN 1000 MCG/ML IJ SOLN
1000.0000 ug | Freq: Once | INTRAMUSCULAR | Status: AC
  Administered 2015-06-10: 1000 ug via INTRAMUSCULAR
  Filled 2015-06-10: qty 1

## 2015-06-17 ENCOUNTER — Inpatient Hospital Stay: Payer: PPO

## 2015-06-17 DIAGNOSIS — E538 Deficiency of other specified B group vitamins: Secondary | ICD-10-CM

## 2015-06-17 MED ORDER — CYANOCOBALAMIN 1000 MCG/ML IJ SOLN
1000.0000 ug | Freq: Once | INTRAMUSCULAR | Status: AC
  Administered 2015-06-17: 1000 ug via INTRAMUSCULAR
  Filled 2015-06-17: qty 1

## 2015-06-20 ENCOUNTER — Ambulatory Visit: Payer: PPO | Admitting: Gastroenterology

## 2015-06-28 ENCOUNTER — Other Ambulatory Visit: Payer: Self-pay

## 2015-06-28 ENCOUNTER — Encounter: Payer: Self-pay | Admitting: Gastroenterology

## 2015-06-28 ENCOUNTER — Other Ambulatory Visit
Admission: RE | Admit: 2015-06-28 | Discharge: 2015-06-28 | Disposition: A | Discharge status: Home / Self Care | Payer: PPO | Source: Ambulatory Visit | Attending: Gastroenterology | Admitting: Gastroenterology

## 2015-06-28 ENCOUNTER — Ambulatory Visit (INDEPENDENT_AMBULATORY_CARE_PROVIDER_SITE_OTHER): Payer: PPO | Admitting: Gastroenterology

## 2015-06-28 VITALS — BP 126/75 | HR 83 | Temp 98.1°F | Ht 70.0 in | Wt 236.0 lb

## 2015-06-28 DIAGNOSIS — R188 Other ascites: Principal | ICD-10-CM

## 2015-06-28 DIAGNOSIS — K7460 Unspecified cirrhosis of liver: Secondary | ICD-10-CM

## 2015-06-28 LAB — CBC WITH DIFFERENTIAL/PLATELET
BAND NEUTROPHILS: 0 % (ref 0–10)
Basophils Absolute: 0 10*3/uL (ref 0.0–0.1)
Basophils Relative: 1 % (ref 0–1)
Blasts: 0 %
EOS ABS: 0.2 10*3/uL (ref 0.0–0.7)
Eosinophils Relative: 2 % (ref 0–5)
HCT: 46.6 % (ref 39.0–52.0)
Hemoglobin: 15.8 g/dL (ref 13.0–17.0)
LYMPHS ABS: 1.1 10*3/uL (ref 0.7–4.0)
Lymphocytes Relative: 16 % (ref 12–46)
MCH: 29.8 pg (ref 26.0–34.0)
MCHC: 33.9 g/dL (ref 30.0–36.0)
MCV: 88.1 fL (ref 78.0–100.0)
METAMYELOCYTES PCT: 0 %
MONO ABS: 0.3 10*3/uL (ref 0.1–1.0)
MONOS PCT: 5 % (ref 3–12)
Myelocytes: 0 %
NEUTROS ABS: 4.9 10*3/uL (ref 1.7–7.7)
Neutrophils Relative %: 75 % (ref 43–77)
OTHER: 0 %
PLATELETS: 76 10*3/uL — AB (ref 150–400)
Promyelocytes Absolute: 0 %
RBC MORPHOLOGY: 0
RBC: 5.29 MIL/uL (ref 4.22–5.81)
RDW: 15.6 % — AB (ref 11.5–15.5)
Smear Review: 0
WBC MORPHOLOGY: 0
WBC: 6.5 10*3/uL (ref 4.0–10.5)
nRBC: 0 /100 WBC

## 2015-06-28 LAB — IRON AND TIBC
IRON: 101 ug/dL (ref 45–182)
SATURATION RATIOS: 28 % (ref 17.9–39.5)
TIBC: 358 ug/dL (ref 250–450)
UIBC: 257 ug/dL

## 2015-06-28 LAB — HEPATIC FUNCTION PANEL
ALT: 32 U/L (ref 17–63)
AST: 49 U/L — ABNORMAL HIGH (ref 15–41)
Albumin: 4.4 g/dL (ref 3.5–5.0)
Alkaline Phosphatase: 73 U/L (ref 38–126)
BILIRUBIN DIRECT: 0.2 mg/dL (ref 0.1–0.5)
BILIRUBIN INDIRECT: 1.5 mg/dL — AB (ref 0.3–0.9)
Total Bilirubin: 1.7 mg/dL — ABNORMAL HIGH (ref 0.3–1.2)
Total Protein: 7 g/dL (ref 6.5–8.1)

## 2015-06-28 LAB — FERRITIN: Ferritin: 39 ng/mL (ref 24–336)

## 2015-06-28 NOTE — Progress Notes (Addendum)
Primary Care Physician: Wilhemena Durie, MD  Primary Gastroenterologist:  Dr. Lucilla Lame  Chief Complaint  Patient presents with  . enlarged liver    HPI: Kenneth Brown is a 64 y.o. male here for cytopenia possible cirrhosis. The patient had a imaging exam that showed a nodular liver with a enlarged spleen and thrombocytopenia. The patient denies any alcohol abuse throughout his life. The patient also denies being exposed to any hepatotoxins. The patient did have a colonoscopy 2 years ago by me that showed a 15 mm polyp that was removed. This polyp was an adenoma. The patient now comes in with increasing abdominal girth and concern for cirrhosis.  Current Outpatient Prescriptions  Medication Sig Dispense Refill  . glucose blood test strip 1 each by Other route as needed for other. Use as instructed    . naproxen (NAPROSYN) 500 MG tablet Take 500 mg by mouth 2 (two) times daily with a meal.    . oxyCODONE-acetaminophen (PERCOCET/ROXICET) 5-325 MG per tablet Take 1 tablet by mouth every 6 (six) hours as needed for severe pain.     No current facility-administered medications for this visit.    Allergies as of 06/28/2015  . (No Known Allergies)    ROS:  General: Negative for anorexia, weight loss, fever, chills, fatigue, weakness. ENT: Negative for hoarseness, difficulty swallowing , nasal congestion. CV: Negative for chest pain, angina, palpitations, dyspnea on exertion, peripheral edema.  Respiratory: Negative for dyspnea at rest, dyspnea on exertion, cough, sputum, wheezing.  GI: See history of present illness. GU:  Negative for dysuria, hematuria, urinary incontinence, urinary frequency, nocturnal urination.  Endo: Negative for unusual weight change.    Physical Examination:   BP 126/75 mmHg  Pulse 83  Temp(Src) 98.1 F (36.7 C) (Oral)  Ht 5\' 10"  (1.778 m)  Wt 236 lb (107.049 kg)  BMI 33.86 kg/m2  General: Well-nourished, well-developed in no acute  distress.  Eyes: No icterus. Conjunctivae pink. Mouth: Oropharyngeal mucosa moist and pink , no lesions erythema or exudate. Lungs: Clear to auscultation bilaterally. Non-labored. Heart: Regular rate and rhythm, no murmurs rubs or gallops.  Abdomen: Bowel sounds are normal, nontender, no masses, there was a ventral hernia and possible ascites noted , no abdominal bruits or hernia , no rebound or guarding.   Extremities: No lower extremity edema. No clubbing or deformities. Neuro: Alert and oriented x 3.  Grossly intact. Skin: Warm and dry, no jaundice.   Psych: Alert and cooperative, normal mood and affect.  Labs:    Imaging Studies: No results found.  Assessment and Plan:   Kenneth Brown is a 64 y.o. y/o male who comes in today with a recent diagnosis of a nodular liver with thrombocytopenia and the likelihood that he has cirrhosis. The patient has had a negative ANA and hepatitis panel. The patient will have his blood sent off for a AMA, ceruloplasmin and iron studies. He will also have his blood checked for alpha-1 antitrypsin deficiency and he will be set up for a other posible causes of his abnormal liver imaging. He will also be set up for a colonoscopy due to his large polyp found at the last colonoscopy and he will also be set up for an EGD to look for esophageal varices. I have discussed risks & benefits which include, but are not limited to, bleeding, infection, perforation & drug reaction.  The patient agrees with this plan & written consent will be obtained.      Note:  This dictation was prepared with Dragon dictation along with smaller phrase technology. Any transcriptional errors that result from this process are unintentional.

## 2015-06-29 ENCOUNTER — Encounter: Payer: Self-pay | Admitting: Urology

## 2015-06-29 LAB — ANTI-SMOOTH MUSCLE ANTIBODY, IGG: F-ACTIN AB IGG: 11 U (ref 0–19)

## 2015-06-29 LAB — IGG, IGA, IGM
IGA: 168 mg/dL (ref 61–437)
IGM, SERUM: 89 mg/dL (ref 20–172)
IgG (Immunoglobin G), Serum: 820 mg/dL (ref 700–1600)

## 2015-06-29 LAB — MITOCHONDRIAL ANTIBODIES: MITOCHONDRIAL M2 AB, IGG: 4.4 U (ref 0.0–20.0)

## 2015-06-29 LAB — CERULOPLASMIN: Ceruloplasmin: 18.8 mg/dL (ref 16.0–31.0)

## 2015-06-29 LAB — AFP TUMOR MARKER: AFP-Tumor Marker: 2.2 ng/mL (ref 0.0–8.3)

## 2015-07-05 ENCOUNTER — Encounter: Payer: Self-pay | Admitting: *Deleted

## 2015-07-06 NOTE — Discharge Instructions (Signed)

## 2015-07-08 ENCOUNTER — Other Ambulatory Visit: Payer: Self-pay | Admitting: Gastroenterology

## 2015-07-08 ENCOUNTER — Ambulatory Visit: Payer: PPO | Admitting: Anesthesiology

## 2015-07-08 ENCOUNTER — Other Ambulatory Visit: Payer: Self-pay

## 2015-07-08 ENCOUNTER — Encounter: Payer: Self-pay | Admitting: Anesthesiology

## 2015-07-08 ENCOUNTER — Ambulatory Visit
Admission: RE | Admit: 2015-07-08 | Discharge: 2015-07-08 | Disposition: A | Discharge status: Home / Self Care | Payer: PPO | Source: Ambulatory Visit | Attending: Gastroenterology | Admitting: Gastroenterology

## 2015-07-08 ENCOUNTER — Encounter
Admission: RE | Disposition: A | Discharge status: Home / Self Care | Payer: Self-pay | Source: Ambulatory Visit | Attending: Gastroenterology

## 2015-07-08 DIAGNOSIS — K5730 Diverticulosis of large intestine without perforation or abscess without bleeding: Secondary | ICD-10-CM | POA: Diagnosis not present

## 2015-07-08 DIAGNOSIS — Z79899 Other long term (current) drug therapy: Secondary | ICD-10-CM | POA: Diagnosis not present

## 2015-07-08 DIAGNOSIS — Z87442 Personal history of urinary calculi: Secondary | ICD-10-CM | POA: Diagnosis not present

## 2015-07-08 DIAGNOSIS — Z825 Family history of asthma and other chronic lower respiratory diseases: Secondary | ICD-10-CM | POA: Insufficient documentation

## 2015-07-08 DIAGNOSIS — I8510 Secondary esophageal varices without bleeding: Secondary | ICD-10-CM | POA: Diagnosis not present

## 2015-07-08 DIAGNOSIS — K7460 Unspecified cirrhosis of liver: Secondary | ICD-10-CM | POA: Insufficient documentation

## 2015-07-08 DIAGNOSIS — I8500 Esophageal varices without bleeding: Secondary | ICD-10-CM | POA: Insufficient documentation

## 2015-07-08 DIAGNOSIS — Z86010 Personal history of colonic polyps: Secondary | ICD-10-CM | POA: Diagnosis not present

## 2015-07-08 DIAGNOSIS — K621 Rectal polyp: Secondary | ICD-10-CM | POA: Diagnosis not present

## 2015-07-08 DIAGNOSIS — K319 Disease of stomach and duodenum, unspecified: Secondary | ICD-10-CM | POA: Insufficient documentation

## 2015-07-08 DIAGNOSIS — Z801 Family history of malignant neoplasm of trachea, bronchus and lung: Secondary | ICD-10-CM | POA: Insufficient documentation

## 2015-07-08 DIAGNOSIS — E119 Type 2 diabetes mellitus without complications: Secondary | ICD-10-CM | POA: Diagnosis not present

## 2015-07-08 DIAGNOSIS — Z823 Family history of stroke: Secondary | ICD-10-CM | POA: Diagnosis not present

## 2015-07-08 DIAGNOSIS — Z8249 Family history of ischemic heart disease and other diseases of the circulatory system: Secondary | ICD-10-CM | POA: Insufficient documentation

## 2015-07-08 DIAGNOSIS — K641 Second degree hemorrhoids: Secondary | ICD-10-CM | POA: Insufficient documentation

## 2015-07-08 DIAGNOSIS — Z808 Family history of malignant neoplasm of other organs or systems: Secondary | ICD-10-CM | POA: Diagnosis not present

## 2015-07-08 HISTORY — PX: COLONOSCOPY WITH PROPOFOL: SHX5780

## 2015-07-08 HISTORY — DX: Thrombocytopenia, unspecified: D69.6

## 2015-07-08 HISTORY — PX: POLYPECTOMY: SHX5525

## 2015-07-08 HISTORY — PX: ESOPHAGOGASTRODUODENOSCOPY (EGD) WITH PROPOFOL: SHX5813

## 2015-07-08 SURGERY — COLONOSCOPY WITH PROPOFOL
Anesthesia: Monitor Anesthesia Care | Wound class: Contaminated

## 2015-07-08 MED ORDER — OXYCODONE HCL 5 MG/5ML PO SOLN: 5.0000 mg | Freq: Once | ORAL | Status: DC | PRN

## 2015-07-08 MED ORDER — GLYCOPYRROLATE 0.2 MG/ML IJ SOLN
INTRAMUSCULAR | Status: DC | PRN
Start: 2015-07-08 — End: 2015-07-08
  Administered 2015-07-08: .1 mg via INTRAVENOUS

## 2015-07-08 MED ORDER — LACTATED RINGERS IV SOLN
INTRAVENOUS | Status: DC
  Administered 2015-07-08: 11:00:00 via INTRAVENOUS

## 2015-07-08 MED ORDER — OXYCODONE HCL 5 MG PO TABS: 5.0000 mg | ORAL_TABLET | Freq: Once | ORAL | Status: DC | PRN

## 2015-07-08 MED ORDER — PROPOFOL 10 MG/ML IV BOLUS
INTRAVENOUS | Status: DC | PRN
  Administered 2015-07-08 (×2): 20 mg via INTRAVENOUS
  Administered 2015-07-08 (×2): 30 mg via INTRAVENOUS
  Administered 2015-07-08: 20 mg via INTRAVENOUS
  Administered 2015-07-08: 30 mg via INTRAVENOUS
  Administered 2015-07-08 (×2): 20 mg via INTRAVENOUS
  Administered 2015-07-08: 60 mg via INTRAVENOUS
  Administered 2015-07-08: 20 mg via INTRAVENOUS

## 2015-07-08 MED ORDER — LIDOCAINE HCL (CARDIAC) 20 MG/ML IV SOLN
INTRAVENOUS | Status: DC | PRN
  Administered 2015-07-08: 50 mg via INTRAVENOUS

## 2015-07-08 MED ORDER — STERILE WATER FOR IRRIGATION IR SOLN
Status: DC | PRN
  Administered 2015-07-08: 12:00:00

## 2015-07-08 SURGICAL SUPPLY — 39 items
BALLN DILATOR 10-12 8 (BALLOONS)
BALLN DILATOR 12-15 8 (BALLOONS)
BALLN DILATOR 15-18 8 (BALLOONS)
BALLN DILATOR CRE 0-12 8 (BALLOONS)
BALLN DILATOR ESOPH 8 10 CRE (MISCELLANEOUS) IMPLANT
BALLOON DILATOR 12-15 8 (BALLOONS) IMPLANT
BALLOON DILATOR 15-18 8 (BALLOONS) IMPLANT
BALLOON DILATOR CRE 0-12 8 (BALLOONS) IMPLANT
BLOCK BITE 60FR ADLT L/F GRN (MISCELLANEOUS) ×4 IMPLANT
CANISTER SUCT 1200ML W/VALVE (MISCELLANEOUS) ×4 IMPLANT
FCP ESCP3.2XJMB 240X2.8X (MISCELLANEOUS)
FORCEPS BIOP RAD 4 LRG CAP 4 (CUTTING FORCEPS) ×4 IMPLANT
FORCEPS BIOP RJ4 240 W/NDL (MISCELLANEOUS)
FORCEPS ESCP3.2XJMB 240X2.8X (MISCELLANEOUS) IMPLANT
GOWN CVR UNV OPN BCK APRN NK (MISCELLANEOUS) ×4 IMPLANT
GOWN ISOL THUMB LOOP REG UNIV (MISCELLANEOUS) ×4
HEMOCLIP INSTINCT (CLIP) IMPLANT
INJECTOR VARIJECT VIN23 (MISCELLANEOUS) IMPLANT
KIT CO2 TUBING (TUBING) IMPLANT
KIT DEFENDO VALVE AND CONN (KITS) IMPLANT
KIT ENDO PROCEDURE OLY (KITS) ×4 IMPLANT
LIGATOR MULTIBAND 6SHOOTER MBL (MISCELLANEOUS) IMPLANT
MARKER SPOT ENDO TATTOO 5ML (MISCELLANEOUS) IMPLANT
PAD GROUND ADULT SPLIT (MISCELLANEOUS) IMPLANT
SNARE SHORT THROW 13M SML OVAL (MISCELLANEOUS) ×4 IMPLANT
SNARE SHORT THROW 30M LRG OVAL (MISCELLANEOUS) IMPLANT
SPOT EX ENDOSCOPIC TATTOO (MISCELLANEOUS)
SUCTION POLY TRAP 4CHAMBER (MISCELLANEOUS) IMPLANT
SYR INFLATION 60ML (SYRINGE) IMPLANT
TRAP SUCTION POLY (MISCELLANEOUS) ×4 IMPLANT
TUBING CONN 6MMX3.1M (TUBING)
TUBING SUCTION CONN 0.25 STRL (TUBING) IMPLANT
UNDERPAD 30X60 958B10 (PK) (MISCELLANEOUS) IMPLANT
VALVE BIOPSY ENDO (VALVE) IMPLANT
VARIJECT INJECTOR VIN23 (MISCELLANEOUS)
WATER AUXILLARY (MISCELLANEOUS) IMPLANT
WATER STERILE IRR 250ML POUR (IV SOLUTION) ×4 IMPLANT
WATER STERILE IRR 500ML POUR (IV SOLUTION) IMPLANT
WIRE CRE 18-20MM 8CM F G (MISCELLANEOUS) IMPLANT

## 2015-07-08 NOTE — Op Note (Signed)
[facility] Gastroenterology Patient Name: Kenneth Brown Procedure Date: 07/08/2015 12:17 PM MRN: [id number] Account #: [id number] Date of Birth: 10/11/1951 Admit Type: Outpatient Age: 64 Room: [facility] OR ROOM 01 Gender: Male Note Status: Finalized Procedure:         Colonoscopy Indications:       High risk colon cancer surveillance: Personal history of                     colonic polyps Providers:         Lucilla Lame, MD Referring MD:      Janine Ores. Rosanna Randy, MD (Referring MD) Medicines:         Propofol per Anesthesia Complications:     No immediate complications. Procedure:         Pre-Anesthesia Assessment:                    - Prior to the procedure, a History and Physical was                     performed, and patient medications and allergies were                     reviewed. The patient's tolerance of previous anesthesia                     was also reviewed. The risks and benefits of the procedure                     and the sedation options and risks were discussed with the                     patient. All questions were answered, and informed consent                     was obtained. Prior Anticoagulants: The patient has taken                     no previous anticoagulant or antiplatelet agents. ASA                     Grade Assessment: II - A patient with mild systemic                     disease. After reviewing the risks and benefits, the                     patient was deemed in satisfactory condition to undergo                     the procedure.                    After obtaining informed consent, the colonoscope was                     passed under direct vision. Throughout the procedure, the                     patient's blood pressure, pulse, and oxygen saturations                     were monitored continuously. The [place]  colonoscope (S#: [id number]) was introduced through the anus                     and advanced to  the the cecum, identified by appendiceal                     orifice and ileocecal valve. The colonoscopy was performed                     without difficulty. The patient tolerated the procedure                     well. The quality of the bowel preparation was poor. Findings:      The perianal and digital rectal examinations were normal.      A 7 mm polyp was found in the rectum. The polyp was sessile. The polyp       was removed with a cold snare. Resection and retrieval were complete.      Multiple small-mouthed diverticula were found in the sigmoid colon and       in the descending colon.      Non-bleeding internal hemorrhoids were found during retroflexion. The       hemorrhoids were Grade II (internal hemorrhoids that prolapse but reduce       spontaneously). Impression:        - Preparation of the colon was poor.                    - One 7 mm polyp in the rectum. Resected and retrieved.                    - Diverticulosis in the sigmoid colon and in the                     descending colon.                    - Non-bleeding internal hemorrhoids. Recommendation:    - Repeat colonoscopy in 5 years for surveillance. Procedure Code(s): --- Professional ---                    [phone], Colonoscopy, flexible; with removal of tumor(s),                     polyp(s), or other lesion(s) by snare technique Diagnosis Code(s): --- Professional ---                    Z86.010, Personal history of colonic polyps                    K64.1, Second degree hemorrhoids                    K62.1, Rectal polyp CPT copyright 2014 American Medical Association. All rights reserved. The codes documented in this report are preliminary and upon coder review may  be revised to meet current compliance requirements. Lucilla Lame, MD 07/08/2015 12:34:53 PM This report has been signed electronically. Number of Addenda: 0 Note Initiated On: 07/08/2015 12:17 PM Scope Withdrawal Time: 0 hours 7 minutes 32 seconds  Total  Procedure Duration: 0 hours 11 minutes 20 seconds       [facility]

## 2015-07-08 NOTE — Anesthesia Preprocedure Evaluation (Addendum)
Anesthesia Evaluation  Patient identified by MRN, date of birth, ID band Patient awake    Reviewed: Allergy & Precautions  History of Anesthesia Complications Negative for: history of anesthetic complications  Airway Mallampati: II  TM Distance: >3 FB Neck ROM: full    Dental no notable dental hx. (+) Chipped   Pulmonary neg pulmonary ROS,    Pulmonary exam normal        Cardiovascular Exercise Tolerance: Good negative cardio ROS Normal cardiovascular exam     Neuro/Psych negative neurological ROS  negative psych ROS   GI/Hepatic negative GI ROS, (+) Cirrhosis  (possibly)      , Thrombocytopenia secondary to splenomegaly possibly related to cirrhosis   Endo/Other  Morbid obesity (bmi=33)  Renal/GU Renal InsufficiencyRenal disease (kidney stones)  negative genitourinary   Musculoskeletal   Abdominal   Peds  Hematology Low plts   Anesthesia Other Findings Plt=76;  Ekg: nsr;  Reproductive/Obstetrics                            Anesthesia Physical Anesthesia Plan  ASA: III  Anesthesia Plan: MAC   Post-op Pain Management:    Induction:   Airway Management Planned:   Additional Equipment:   Intra-op Plan:   Post-operative Plan:   Informed Consent: I have reviewed the patients History and Physical, chart, labs and discussed the procedure including the risks, benefits and alternatives for the proposed anesthesia with the patient or authorized representative who has indicated his/her understanding and acceptance.     Plan Discussed with: CRNA  Anesthesia Plan Comments:        Anesthesia Quick Evaluation

## 2015-07-08 NOTE — Op Note (Signed)
[facility] Gastroenterology Patient Name: Kenneth Brown Procedure Date: 07/08/2015 12:04 PM MRN: [id number] Account #: [id number] Date of Birth: 1951/02/08 Admit Type: Outpatient Age: 64 Room: [facility] OR ROOM 01 Gender: Male Note Status: Finalized Procedure:         Upper GI endoscopy Indications:       Cirrhosis with suspected esophageal varices Providers:         Lucilla Lame, MD Referring MD:      Janine Ores. Rosanna Randy, MD (Referring MD) Medicines:         Propofol per Anesthesia Complications:     No immediate complications. Procedure:         Pre-Anesthesia Assessment:                    - Prior to the procedure, a History and Physical was                     performed, and patient medications and allergies were                     reviewed. The patient's tolerance of previous anesthesia                     was also reviewed. The risks and benefits of the procedure                     and the sedation options and risks were discussed with the                     patient. All questions were answered, and informed consent                     was obtained. Prior Anticoagulants: The patient has taken                     no previous anticoagulant or antiplatelet agents. ASA                     Grade Assessment: II - A patient with mild systemic                     disease. After reviewing the risks and benefits, the                     patient was deemed in satisfactory condition to undergo                     the procedure.                    After obtaining informed consent, the endoscope was passed                     under direct vision. Throughout the procedure, the                     patient's blood pressure, pulse, and oxygen saturations                     were monitored continuously. The Olympus [id number]-[id number]                     Endoscope (S#. [id number]) was introduced through the mouth,  and advanced to the second part of duodenum. The upper GI                   endoscopy was accomplished without difficulty. The patient                     tolerated the procedure well. Findings:      Grade I varices were found in the lower third of the esophagus.      Multiple dispersed, medium-sized non-bleeding erosions were found in the       gastric antrum. There were no stigmata of recent bleeding. Biopsies were       taken with a cold forceps for histology.      The examined duodenum was normal. Impression:        - Grade I esophageal varices.                    - Non-bleeding erosive gastropathy. Biopsied.                    - Normal examined duodenum. Recommendation:    - Await pathology results.                    - Perform a colonoscopy today. Procedure Code(s): --- Professional ---                    [phone], Esophagogastroduodenoscopy, flexible, transoral;                     with biopsy, single or multiple Diagnosis Code(s): --- Professional ---                    K74.60, Unspecified cirrhosis of liver                    K31.9, Disease of stomach and duodenum, unspecified                    I85.10, Secondary esophageal varices without bleeding CPT copyright 2014 American Medical Association. All rights reserved. The codes documented in this report are preliminary and upon coder review may  be revised to meet current compliance requirements. Lucilla Lame, MD 07/08/2015 12:17:17 PM This report has been signed electronically. Number of Addenda: 0 Note Initiated On: 07/08/2015 12:04 PM Total Procedure Duration: 0 hours 1 minute 29 seconds       [facility]

## 2015-07-08 NOTE — Transfer of Care (Signed)
Immediate Anesthesia Transfer of Care Note  Patient: Kenneth Brown  Procedure(s) Performed: Procedure(s): COLONOSCOPY WITH PROPOFOL (N/A) ESOPHAGOGASTRODUODENOSCOPY (EGD) WITH PROPOFOL (N/A) POLYPECTOMY  Patient Location: PACU  Anesthesia Type: MAC  Level of Consciousness: awake, alert  and patient cooperative  Airway and Oxygen Therapy: Patient Spontanous Breathing and Patient connected to supplemental oxygen  Post-op Assessment: Post-op Vital signs reviewed, Patient's Cardiovascular Status Stable, Respiratory Function Stable, Patent Airway and No signs of Nausea or vomiting  Post-op Vital Signs: Reviewed and stable  Complications: No apparent anesthesia complications

## 2015-07-08 NOTE — H&P (Signed)
[facility]  [address] [place], [place] [address] Phone: [phone] Fax : [phone]  Primary Care Physician:  Wilhemena Durie, MD Primary Gastroenterologist:  Dr. Allen Norris  Pre-Procedure History & Physical: HPI:  Kenneth Brown is a 64 y.o. male is here for an endoscopy and colonoscopy.   Past Medical History  Diagnosis Date  . UTI (urinary tract infection) 10/2014  . Diabetes mellitus without complication     pre diabetic  . Renal calculus, bilateral   . Difficulty urinating   . Acute cystitis   . Gross hematuria   . Platelets decreased     Past Surgical History  Procedure Laterality Date  . Ankle surgery  08/28/12  . Orif ankle fracture Right     plate and screws, pt still has  . Ureteroscopy with holmium laser lithotripsy Right 03/21/2015    Procedure: URETEROSCOPY WITH HOLMIUM LASER LITHOTRIPSY,retrograde pyelogram,stent placement;  Surgeon: Collier Flowers, MD;  Location: [facility];  Service: Urology;  Laterality: Right;    Prior to Admission medications   Medication Sig Start Date End Date Taking? Authorizing Provider  glucose blood test strip 1 each by Other route as needed for other. Use as instructed   Yes Historical Provider, MD  naproxen (NAPROSYN) 500 MG tablet Take 500 mg by mouth 2 (two) times daily with a meal.   Yes Historical Provider, MD  oxyCODONE-acetaminophen (PERCOCET/ROXICET) 5-325 MG per tablet Take 1 tablet by mouth every 6 (six) hours as needed for severe pain.    Historical Provider, MD    Allergies as of 06/28/2015  . (No Known Allergies)    Family History  Problem Relation Age of Onset  . Hypertension Mother   . Heart disease Mother   . Stroke Mother   . COPD Mother   . Heart disease Father   . Heart attack Father   . Lung cancer Father   . Skin cancer Father     Social History   Social History  . Marital Status: Married    Spouse Name: N/A  . Number of Children: N/A  . Years of Education: N/A     Occupational History  . Not on file.   Social History Main Topics  . Smoking status: Never Smoker   . Smokeless tobacco: Never Used  . Alcohol Use: 0.0 - 0.6 oz/week    0-1 Cans of beer per week  . Drug Use: No  . Sexual Activity: Not on file   Other Topics Concern  . Not on file   Social History Narrative    Review of Systems: See HPI, otherwise negative ROS  Physical Exam: BP 134/83 mmHg  Pulse 74  Temp(Src) 98.1 F (36.7 C) (Temporal)  Resp 16  Ht 5\' 10"  (1.778 m)  Wt 232 lb (105.235 kg)  BMI 33.29 kg/m2  SpO2 96% General:   Alert,  pleasant and cooperative in NAD Head:  Normocephalic and atraumatic. Neck:  Supple; no masses or thyromegaly. Lungs:  Clear throughout to auscultation.    Heart:  Regular rate and rhythm. Abdomen:  Soft, nontender and nondistended. Normal bowel sounds, without guarding, and without rebound.   Neurologic:  Alert and  oriented x4;  grossly normal neurologically.  Impression/Plan: [name] is here for an endoscopy and colonoscopy to be performed for cirrhosis and historyy of polyps.  Risks, benefits, limitations, and alternatives regarding  endoscopy and colonoscopy have been reviewed with the patient.  Questions have been answered.  All parties agreeable.  Ollen Bowl, MD  07/08/2015, 11:04 AM

## 2015-07-08 NOTE — Anesthesia Procedure Notes (Signed)
Procedure Name: MAC Performed by: Elim Peale Pre-anesthesia Checklist: Patient identified, Emergency Drugs available, Suction available, Timeout performed and Patient being monitored Patient Re-evaluated:Patient Re-evaluated prior to inductionOxygen Delivery Method: Nasal cannula Placement Confirmation: positive ETCO2       

## 2015-07-08 NOTE — Anesthesia Postprocedure Evaluation (Addendum)
  Anesthesia Post-op Note  Patient: Kenneth Brown  Procedure(s) Performed: Procedure(s): COLONOSCOPY WITH PROPOFOL (N/A) ESOPHAGOGASTRODUODENOSCOPY (EGD) WITH PROPOFOL (N/A) POLYPECTOMY  Anesthesia type:MAC  Patient location: PACU  Post pain: Pain level controlled  Post assessment: Post-op Vital signs reviewed, Patient's Cardiovascular Status Stable, Respiratory Function Stable, Patent Airway and No signs of Nausea or vomiting  Post vital signs: Reviewed and stable  Last Vitals:  Filed Vitals:   07/08/15 1247  BP: 105/65  Pulse: 71  Temp:   Resp: 12    Level of consciousness: awake, alert  and patient cooperative  Complications: No apparent anesthesia complications

## 2015-07-11 ENCOUNTER — Encounter: Payer: Self-pay | Admitting: Gastroenterology

## 2015-07-11 ENCOUNTER — Telehealth: Payer: Self-pay | Admitting: Gastroenterology

## 2015-07-11 NOTE — Telephone Encounter (Signed)
Patients wife called asking about some tests that are suppose to be scheduled, She was just following up

## 2015-07-12 ENCOUNTER — Ambulatory Visit (INDEPENDENT_AMBULATORY_CARE_PROVIDER_SITE_OTHER): Payer: PPO | Admitting: Urology

## 2015-07-12 ENCOUNTER — Encounter: Payer: Self-pay | Admitting: Gastroenterology

## 2015-07-12 ENCOUNTER — Encounter: Payer: Self-pay | Admitting: Urology

## 2015-07-12 ENCOUNTER — Ambulatory Visit
Admission: RE | Admit: 2015-07-12 | Discharge: 2015-07-12 | Disposition: A | Discharge status: Home / Self Care | Payer: PPO | Source: Ambulatory Visit | Attending: Urology | Admitting: Urology

## 2015-07-12 VITALS — BP 134/80 | HR 82 | Ht 70.0 in | Wt 236.2 lb

## 2015-07-12 DIAGNOSIS — N200 Calculus of kidney: Secondary | ICD-10-CM

## 2015-07-12 NOTE — Progress Notes (Signed)
07/12/2015 3:00 PM   Kenneth Brown 10-17-1951 [id number]  Referring provider: Jerrol Banana., MD [address] [place], [place] [address]  Chief Complaint  Patient presents with  . Results    24hour urine results    HPI: Post right upper ureter calculus stent had been placed. Patient has passed all his calculi.. Very pleased with his procedure. Redo KUB today.     PMH: Past Medical History  Diagnosis Date  . UTI (urinary tract infection) 10/2014  . Diabetes mellitus without complication     pre diabetic  . Renal calculus, bilateral   . Difficulty urinating   . Acute cystitis   . Gross hematuria   . Platelets decreased     Surgical History: Past Surgical History  Procedure Laterality Date  . Ankle surgery  08/28/12  . Orif ankle fracture Right     plate and screws, pt still has  . Ureteroscopy with holmium laser lithotripsy Right 03/21/2015    Procedure: URETEROSCOPY WITH HOLMIUM LASER LITHOTRIPSY,retrograde pyelogram,stent placement;  Surgeon: Collier Flowers, MD;  Location: [facility];  Service: Urology;  Laterality: Right;  . Colonoscopy with propofol N/A 07/08/2015    Procedure: COLONOSCOPY WITH PROPOFOL;  Surgeon: Lucilla Lame, MD;  Location: [place];  Service: Endoscopy;  Laterality: N/A;  . Esophagogastroduodenoscopy (egd) with propofol N/A 07/08/2015    Procedure: ESOPHAGOGASTRODUODENOSCOPY (EGD) WITH PROPOFOL;  Surgeon: Lucilla Lame, MD;  Location: [place];  Service: Endoscopy;  Laterality: N/A;  . Polypectomy  07/08/2015    Procedure: POLYPECTOMY;  Surgeon: Lucilla Lame, MD;  Location: [place];  Service: Endoscopy;;    Home Medications:    Medication List       This list is accurate as of: 07/12/15  3:00 PM.  Always use your most recent med list.               glucose blood test strip  1 each by Other route as needed for other. Use as instructed     oxyCODONE-acetaminophen 5-325 MG per tablet    Commonly known as:  PERCOCET/ROXICET  Take 1 tablet by mouth every 6 (six) hours as needed for severe pain.        Allergies: No Known Allergies  Family History: Family History  Problem Relation Age of Onset  . Hypertension Mother   . Heart disease Mother   . Stroke Mother   . COPD Mother   . Heart disease Father   . Heart attack Father   . Lung cancer Father   . Skin cancer Father     Social History:  reports that he has never smoked. He has never used smokeless tobacco. He reports that he drinks alcohol. He reports that he does not use illicit drugs.  ROS: UROLOGY Frequent Urination?: No Hard to postpone urination?: No Burning/pain with urination?: No Get up at night to urinate?: No Leakage of urine?: No Urine stream starts and stops?: No Trouble starting stream?: No Do you have to strain to urinate?: No Blood in urine?: No Urinary tract infection?: No Sexually transmitted disease?: No Injury to kidneys or bladder?: No Painful intercourse?: No Weak stream?: No Erection problems?: No Penile pain?: No  Gastrointestinal Nausea?: No Vomiting?: No Indigestion/heartburn?: No Diarrhea?: No Constipation?: No  Constitutional Fever: No Night sweats?: No Weight loss?: No Fatigue?: No  Skin Skin rash/lesions?: No Itching?: No  Eyes Blurred vision?: No Double vision?: No  Ears/Nose/Throat Sore throat?: No Sinus problems?: No  Hematologic/Lymphatic Swollen glands?: No Easy bruising?: No  Cardiovascular Leg swelling?: No Chest pain?: No  Respiratory Cough?: No Shortness of breath?: No  Endocrine Excessive thirst?: No  Musculoskeletal Back pain?: No Joint pain?: No  Neurological Headaches?: No Dizziness?: No  Psychologic Depression?: No Anxiety?: No  Physical Exam: BP 134/80 mmHg  Pulse 82  Ht 5\' 10"  (1.778 m)  Wt 236 lb 3.2 oz (107.14 kg)  BMI 33.89 kg/m2  Constitutional:  Alert and oriented, No acute distress. HEENT: [place] AT,  moist mucus membranes.  Trachea midline, no masses. Cardiovascular: No clubbing, cyanosis, or edema. Respiratory: Normal respiratory effort, no increased work of breathing. GI: Abdomen is soft, nontender, nondistended, no abdominal masses GU: No CVA tenderness.  Skin: No rashes, bruises or suspicious lesions. Lymph: No cervical or inguinal adenopathy. Neurologic: Grossly intact, no focal deficits, moving all 4 extremities. Psychiatric: Normal mood and affect.  Laboratory Data: Lab Results  Component Value Date   WBC 6.5 06/28/2015   HGB 15.8 06/28/2015   HCT 46.6 06/28/2015   MCV 88.1 06/28/2015   PLT 76* 06/28/2015    Lab Results  Component Value Date   CREATININE 1.17 04/11/2015    Lab Results  Component Value Date   PSA 1.1 11/03/2014    No results found for: TESTOSTERONE  Lab Results  Component Value Date   HGBA1C 6.1* 04/11/2015    Urinalysis    Component Value Date/Time   COLORURINE YELLOW* 03/08/2015 1245   APPEARANCEUR CLEAR* 03/08/2015 1245   LABSPEC 1.018 03/08/2015 1245   PHURINE 6.0 03/08/2015 1245   GLUCOSEU NEGATIVE 03/08/2015 1245   HGBUR NEGATIVE 03/08/2015 1245   [place] 03/08/2015 1245   KETONESUR NEGATIVE 03/08/2015 1245   PROTEINUR NEGATIVE 03/08/2015 1245   NITRITE NEGATIVE 03/08/2015 1245   LEUKOCYTESUR TRACE* 03/08/2015 1245    Pertinent Imaging: None today KUB ordered  Assessment & Plan:  Post ESWL. 24-hour litho-link urine reviewed. Patient has oxaluria and supersaturation of uric acid with a slightly decreased proper fluid intake. In increased fluids citrate levels are normal decrease oxalate-containing foods which were reviewed. Decrease me to once a day and only one helping of protein at night only. Follow-up KUB ordered  1. Kidney stones Brought in many fragments of calculi that were passed after stent was removed - Abdomen 1 view (KUB); Future - Abdomen 1 view (KUB); Future   No Follow-up on file.  Collier Flowers, [facility] [address], [place], [place] [address] [phone]

## 2015-07-13 ENCOUNTER — Ambulatory Visit
Admission: RE | Admit: 2015-07-13 | Discharge: 2015-07-13 | Disposition: A | Discharge status: Home / Self Care | Payer: PPO | Source: Ambulatory Visit | Attending: Gastroenterology | Admitting: Gastroenterology

## 2015-07-13 DIAGNOSIS — K7460 Unspecified cirrhosis of liver: Secondary | ICD-10-CM | POA: Insufficient documentation

## 2015-07-14 ENCOUNTER — Ambulatory Visit
Admission: RE | Admit: 2015-07-14 | Discharge: 2015-07-14 | Disposition: A | Discharge status: Home / Self Care | Payer: PPO | Source: Ambulatory Visit | Attending: Gastroenterology | Admitting: Gastroenterology

## 2015-07-14 ENCOUNTER — Telehealth: Payer: Self-pay

## 2015-07-14 DIAGNOSIS — R188 Other ascites: Secondary | ICD-10-CM

## 2015-07-14 DIAGNOSIS — K7460 Unspecified cirrhosis of liver: Secondary | ICD-10-CM | POA: Insufficient documentation

## 2015-07-14 NOTE — Telephone Encounter (Signed)
Pt notified of results

## 2015-07-14 NOTE — Telephone Encounter (Signed)
-----   Message from Lucilla Lame, MD sent at 07/14/2015 10:55 AM EDT ----- Let the patient know that the ultrasound did not show any masses.

## 2015-07-14 NOTE — Telephone Encounter (Signed)
-----   Message from Lucilla Lame, MD sent at 07/05/2015 12:45 PM EDT ----- Let the patient his tests came back without any worrisome findings except the platlets were still low and and one of his liver enzymes were still high.

## 2015-07-15 ENCOUNTER — Telehealth: Payer: Self-pay

## 2015-07-15 ENCOUNTER — Other Ambulatory Visit: Payer: Self-pay

## 2015-07-15 ENCOUNTER — Inpatient Hospital Stay (HOSPITAL_BASED_OUTPATIENT_CLINIC_OR_DEPARTMENT_OTHER): Payer: PPO | Admitting: Hematology and Oncology

## 2015-07-15 ENCOUNTER — Inpatient Hospital Stay: Payer: PPO | Attending: Hematology and Oncology

## 2015-07-15 ENCOUNTER — Inpatient Hospital Stay: Payer: PPO

## 2015-07-15 VITALS — BP 125/77 | HR 73 | Temp 97.0°F | Resp 18 | Ht 70.0 in | Wt 235.9 lb

## 2015-07-15 DIAGNOSIS — K769 Liver disease, unspecified: Secondary | ICD-10-CM | POA: Diagnosis not present

## 2015-07-15 DIAGNOSIS — K5790 Diverticulosis of intestine, part unspecified, without perforation or abscess without bleeding: Secondary | ICD-10-CM | POA: Insufficient documentation

## 2015-07-15 DIAGNOSIS — I8500 Esophageal varices without bleeding: Secondary | ICD-10-CM | POA: Diagnosis not present

## 2015-07-15 DIAGNOSIS — D128 Benign neoplasm of rectum: Secondary | ICD-10-CM | POA: Insufficient documentation

## 2015-07-15 DIAGNOSIS — E538 Deficiency of other specified B group vitamins: Secondary | ICD-10-CM | POA: Diagnosis not present

## 2015-07-15 DIAGNOSIS — Z87442 Personal history of urinary calculi: Secondary | ICD-10-CM | POA: Diagnosis not present

## 2015-07-15 DIAGNOSIS — Z87440 Personal history of urinary (tract) infections: Secondary | ICD-10-CM

## 2015-07-15 DIAGNOSIS — Z808 Family history of malignant neoplasm of other organs or systems: Secondary | ICD-10-CM | POA: Insufficient documentation

## 2015-07-15 DIAGNOSIS — Z8781 Personal history of (healed) traumatic fracture: Secondary | ICD-10-CM | POA: Diagnosis not present

## 2015-07-15 DIAGNOSIS — K621 Rectal polyp: Secondary | ICD-10-CM | POA: Insufficient documentation

## 2015-07-15 DIAGNOSIS — R161 Splenomegaly, not elsewhere classified: Secondary | ICD-10-CM | POA: Diagnosis not present

## 2015-07-15 DIAGNOSIS — E119 Type 2 diabetes mellitus without complications: Secondary | ICD-10-CM | POA: Diagnosis not present

## 2015-07-15 DIAGNOSIS — D696 Thrombocytopenia, unspecified: Secondary | ICD-10-CM | POA: Insufficient documentation

## 2015-07-15 DIAGNOSIS — Z801 Family history of malignant neoplasm of trachea, bronchus and lung: Secondary | ICD-10-CM

## 2015-07-15 LAB — CBC WITH DIFFERENTIAL/PLATELET
Basophils Absolute: 0 10*3/uL (ref 0–0.1)
Basophils Relative: 1 %
Eosinophils Absolute: 0.1 10*3/uL (ref 0–0.7)
Eosinophils Relative: 2 %
HCT: 46.3 % (ref 40.0–52.0)
Hemoglobin: 15.8 g/dL (ref 13.0–18.0)
Lymphocytes Relative: 19 %
Lymphs Abs: 1 10*3/uL (ref 1.0–3.6)
MCH: 30 pg (ref 26.0–34.0)
MCHC: 34.1 g/dL (ref 32.0–36.0)
MCV: 88 fL (ref 80.0–100.0)
Monocytes Absolute: 0.5 10*3/uL (ref 0.2–1.0)
Monocytes Relative: 8 %
Neutro Abs: 3.8 10*3/uL (ref 1.4–6.5)
Neutrophils Relative %: 70 %
Platelets: 76 10*3/uL — ABNORMAL LOW (ref 150–440)
RBC: 5.26 MIL/uL (ref 4.40–5.90)
RDW: 15.2 % — ABNORMAL HIGH (ref 11.5–14.5)
WBC: 5.4 10*3/uL (ref 3.8–10.6)

## 2015-07-15 LAB — VITAMIN B12: Vitamin B-12: 265 pg/mL (ref 180–914)

## 2015-07-15 MED ORDER — CYANOCOBALAMIN 1000 MCG/ML IJ SOLN
1000.0000 ug | Freq: Once | INTRAMUSCULAR | Status: AC
  Administered 2015-07-15: 1000 ug via INTRAMUSCULAR
  Filled 2015-07-15: qty 1

## 2015-07-15 NOTE — Progress Notes (Signed)
[facility] day:  07/15/2015  Chief Complaint: Kenneth Brown is a 64 y.o. male with thrombocytopenia who is seen for review of work-up and discussion regarding direction of therapy.  HPI: The patient was last seen in the medical oncology clinic on 05/20/2015.  At that time, his thrombocytopenia was felt secondary to underlying liver disease and resultant splenomegaly.  During his workup he was noted to be B12 deficient. He was started on B12. He received B12 weekly from 07/13 until 06/17/2015 prior to planned initiation of monthly B12.  Patient was referred to his primary gastroenterologist Dr. Allen Norris for assessment of his underlying liver disease. He underwent EGD and colonoscopy on 07/08/2015. 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  He also had dverticulosis and a 7 mm polyp in the rectum.  Pathology from his gastric biopsy revealed reactive gastropathy. There was a tubular adenoma in the rectum.  He has undergone evaluation for his underlying liver disease by Dr. Lucilla Lame. Negative studies on 06/28/2015. Included an AFP (2.2), mitochondrial M2 antibody, F-Acton IgG, immunoglobulins, ceruloplasmin, ferritin (39) with normal iron studies. Bilirubin was 1.7, AST 49, ALT 32, and alkaline phosphatase 73.  He follows up on 07/18/2015 with Dr. Allen Norris. Abdominal ultrasound on 07/13/2015 revealed coarse hepatic echotexture was slightly nodular hepatic borders consistent with cirrhosis.  Hepatic elastography on 07/14/2015 revealed a median shear wave velocity of 1.2 m/sec corresponding to a Metavir fibrosis score of F2 + some F3.   Risk of fibrosis was moderate. Consideration is being made for a liver biopsy.  Symptomatically he feels good.  He denies any complaints.  Past Medical History  Diagnosis Date  . UTI (urinary tract infection) 10/2014  . Diabetes mellitus  without complication     pre diabetic  . Renal calculus, bilateral   . Difficulty urinating   . Acute cystitis   . Gross hematuria   . Platelets decreased     Past Surgical History  Procedure Laterality Date  . Ankle surgery  08/28/12  . Orif ankle fracture Right     plate and screws, pt still has  . Ureteroscopy with holmium laser lithotripsy Right 03/21/2015    Procedure: URETEROSCOPY WITH HOLMIUM LASER LITHOTRIPSY,retrograde pyelogram,stent placement;  Surgeon: Collier Flowers, MD;  Location: [facility];  Service: Urology;  Laterality: Right;  . Colonoscopy with propofol N/A 07/08/2015    Procedure: COLONOSCOPY WITH PROPOFOL;  Surgeon: Lucilla Lame, MD;  Location: [place];  Service: Endoscopy;  Laterality: N/A;  . Esophagogastroduodenoscopy (egd) with propofol N/A 07/08/2015    Procedure: ESOPHAGOGASTRODUODENOSCOPY (EGD) WITH PROPOFOL;  Surgeon: Lucilla Lame, MD;  Location: [place];  Service: Endoscopy;  Laterality: N/A;  . Polypectomy  07/08/2015    Procedure: POLYPECTOMY;  Surgeon: Lucilla Lame, MD;  Location: [place];  Service: Endoscopy;;    Family History  Problem Relation Age of Onset  . Hypertension Mother   . Heart disease Mother   . Stroke Mother   . COPD Mother   . Heart disease Father   . Heart attack Father   . Lung cancer Father   . Skin cancer Father     Social History:  reports that he has never smoked. He has never used smokeless tobacco. He reports that he drinks alcohol. He reports that he does not use illicit drugs.  He is accompanied by his wife.  Allergies: No Known Allergies  Current Medications: Current Outpatient Prescriptions  Medication Sig Dispense Refill  . glucose blood test strip 1 each by Other route as needed for other. Use as instructed    . oxyCODONE-acetaminophen (PERCOCET/ROXICET) 5-325 MG per tablet Take 1 tablet by mouth every 6 (six) hours as needed for severe pain.     No current facility-administered  medications for this visit.   Review of Systems:  GENERAL:  Feels good.  Active.  No fevers, sweats or weight loss. PERFORMANCE STATUS (ECOG):  0 HEENT:  No visual changes, sore throat, mouth sores or tenderness. Lungs: No shortness of breath or cough.  No hemoptysis. Cardiac:  No chest pain, palpitations, orthopnea, or PND. GI:  No nausea, vomiting, diarrhea, constipation, melena or hematochezia.  Colonoscopy 2015. GU:  No urgency, frequency, dysuria, or hematuria. Musculoskeletal:  No back pain.  Ankle pain.  No muscle tenderness. Extremities:  No pain or swelling. Skin:  No rashes or skin changes. Neuro:  No headache, numbness or weakness, balance or coordination issues. Endocrine:  No diabetes, thyroid issues, hot flashes or night sweats. Psych:  No mood changes, depression or anxiety. Pain:  No focal pain. Review of systems:  All other systems reviewed and found to be negative.   Physical Exam: Blood pressure 125/77, pulse 73, temperature 97 F (36.1 C), temperature source Oral, resp. rate 18, height 5\' 10"  (1.778 m), weight 235 lb 14.3 oz (107.001 kg), SpO2 100 %. GENERAL:  Well developed, well nourished, sitting comfortably in the exam room in no acute distress. MENTAL STATUS:  Alert and oriented to person, place and time. HEAD:  Pearline Cables hair.  Normocephalic, atraumatic, face symmetric, no Cushingoid features. EYES:  Blue eyes.  Pupils equal round and reactive to light and accomodation.  No conjunctivitis or scleral icterus. ENT:  Oropharynx clear without lesion.  Tongue normal. Mucous membranes moist.  RESPIRATORY:  Clear to auscultation without rales, wheezes or rhonchi. CARDIOVASCULAR:  Regular rate and rhythm without murmur, rub or gallop. ABDOMEN:  Soft, non-tender, with active bowel sounds, and no hepatomegaly.  Spleen palpable.  No masses. SKIN:  No rashes, ulcers or lesions. EXTREMITIES: No edema, no skin discoloration or tenderness.  No palpable cords. LYMPH NODES: No  palpable cervical, supraclavicular, axillary or inguinal adenopathy  NEUROLOGICAL: Unremarkable. PSYCH:  Appropriate.   Appointment on 07/15/2015  Component Date Value Ref Range Status  . WBC 07/15/2015 5.4  3.8 - 10.6 K/uL Final  . RBC 07/15/2015 5.26  4.40 - 5.90 MIL/uL Final  . Hemoglobin 07/15/2015 15.8  13.0 - 18.0 g/dL Final  . HCT 07/15/2015 46.3  40.0 - 52.0 % Final  . MCV 07/15/2015 88.0  80.0 - 100.0 fL Final  . MCH 07/15/2015 30.0  26.0 - 34.0 pg Final  . MCHC 07/15/2015 34.1  32.0 - 36.0 g/dL Final  . RDW 07/15/2015 15.2* 11.5 - 14.5 % Final  . Platelets 07/15/2015 76* 150 - 440 K/uL Final  . Neutrophils Relative % 07/15/2015 70   Final  . Neutro Abs 07/15/2015 3.8  1.4 - 6.5 K/uL Final  . Lymphocytes Relative 07/15/2015 19   Final  . Lymphs Abs 07/15/2015 1.0  1.0 - 3.6 K/uL Final  . Monocytes Relative 07/15/2015 8   Final  . Monocytes Absolute 07/15/2015 0.5  0.2 - 1.0 K/uL Final  . Eosinophils Relative 07/15/2015 2   Final  . Eosinophils Absolute 07/15/2015 0.1  0 - 0.7 K/uL Final  . Basophils Relative 07/15/2015 1  Final  . Basophils Absolute 07/15/2015 0.0  0 - 0.1 K/uL Final    Assessment:  Kenneth Brown is a 64 y.o. male with a history of mild thrombocytopenia dating back to 2013.  Platelet count over the past 6 months has ranged between 59,000 and 74,000 without trend.  Abdominal and pelvic CT scan on 01/14/2015 revealed a nodular liver and moderate splenomegaly (18 cm).  He denied any new medications or herbal products.  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  He also had dverticulosis and a 7 mm polyp in the rectum.  Pathology from his gastric biopsy revealed reactive gastropathy. There was a tubular adenoma in the rectum.  Negative studies on  06/28/2015. Included an AFP (2.2), mitochondrial M2 antibody, F-Acton IgG, immunoglobulins, ceruloplasmin, ferritin (39), and iron studies. Bilirubin was 1.7, AST 49, ALT 32, and alkaline phosphatase 73.  Abdominal ultrasound on 07/13/2015 revealed coarse hepatic echotexture was slightly nodular hepatic borders consistent with cirrhosis.  Hepatic elastography on 07/14/2015 revealed a median shear wave velocity of 1.2 m/sec corresponding to a Metavir fibrosis score of F2 + some F3.   Risk of fibrosis was moderate. Consideration is being made for a liver biopsy.  Work-up on 05/06/2015 revealed B12 deficiency.  CBC revealed a hematocrit of 48.3, hemoglobin 16.1, platelets 67,000, WBC 5500 with an ANC of 3900.  Differential was unremarkable.  B12 was 142 (low).  Folate was 11.1 (normal).  Hepatitis B core antibody total, hepatitis B surface antigen, hepatitis C antibody, and HIV testing was negative.  SPEP, ANA, lupus anticoagulant, TSH, and LDH were normal.  He received B12 weekly from 07/13 until 06/17/2015 prior to planned initiation of monthly B12.  Symptomatically, he feels well.  Exam reveals a palpable spleen.  Platelet count is stable.  Plan: 1. Labs today:  CBC with diff, B12. 2. B12 today.. 3. Follow-up as scheduled with Dr Allen Norris on 07/18/2015. 4. RTC in 1 month for MD assess, labs (CBC with diff in blue top tube), review of work-up by Dr. Allen Norris, and B12.   Lequita Asal, MD  07/15/2015, 10:59 AM

## 2015-07-15 NOTE — Telephone Encounter (Signed)
Pt notified of results. Pt's wife wanted to know what treatment options were available. Pt scheduled for a follow up with Dr. Allen Norris on Monday, Sept 19th.

## 2015-07-15 NOTE — Telephone Encounter (Signed)
-----   Message from Lucilla Lame, MD sent at 07/14/2015 12:49 PM EDT ----- Let the patient know his fibrosis score was 2-3 out of 4.

## 2015-07-16 ENCOUNTER — Encounter: Payer: Self-pay | Admitting: Hematology and Oncology

## 2015-07-18 ENCOUNTER — Encounter (INDEPENDENT_AMBULATORY_CARE_PROVIDER_SITE_OTHER): Payer: Self-pay

## 2015-07-18 ENCOUNTER — Ambulatory Visit (INDEPENDENT_AMBULATORY_CARE_PROVIDER_SITE_OTHER): Payer: PPO | Admitting: Gastroenterology

## 2015-07-18 ENCOUNTER — Encounter: Payer: Self-pay | Admitting: Gastroenterology

## 2015-07-18 VITALS — BP 133/70 | HR 86 | Temp 97.8°F | Ht 70.0 in | Wt 238.0 lb

## 2015-07-18 DIAGNOSIS — K7460 Unspecified cirrhosis of liver: Secondary | ICD-10-CM

## 2015-07-18 DIAGNOSIS — R748 Abnormal levels of other serum enzymes: Secondary | ICD-10-CM

## 2015-07-18 DIAGNOSIS — D696 Thrombocytopenia, unspecified: Secondary | ICD-10-CM | POA: Diagnosis not present

## 2015-07-18 NOTE — Progress Notes (Signed)
Primary Care Physician: Wilhemena Durie, MD  Primary Gastroenterologist:  Dr. Lucilla Lame  Chief Complaint  Patient presents with  . Follow-up    Discuss possible liver biopsy    HPI: Kenneth Brown is a 64 y.o. male here For follow-up of his abnormal liver enzymes. The patient's labs did not show any cause for his abnormal liver enzymes. The elasticity scan showed F2/F3 although the ultrasound showed modularity with signs of cirrhosis. The patient has thrombocytopenia.  Current Outpatient Prescriptions  Medication Sig Dispense Refill  . naproxen (NAPROSYN) 250 MG tablet Take by mouth as needed.     No current facility-administered medications for this visit.    Allergies as of 07/18/2015  . (No Known Allergies)    ROS:  General: Negative for anorexia, weight loss, fever, chills, fatigue, weakness. ENT: Negative for hoarseness, difficulty swallowing , nasal congestion. CV: Negative for chest pain, angina, palpitations, dyspnea on exertion, peripheral edema.  Respiratory: Negative for dyspnea at rest, dyspnea on exertion, cough, sputum, wheezing.  GI: See history of present illness. GU:  Negative for dysuria, hematuria, urinary incontinence, urinary frequency, nocturnal urination.  Endo: Negative for unusual weight change.    Physical Examination:   BP 133/70 mmHg  Pulse 86  Temp(Src) 97.8 F (36.6 C) (Oral)  Ht 5\' 10"  (1.778 m)  Wt 238 lb (107.956 kg)  BMI 34.15 kg/m2  General: Well-nourished, well-developed in no acute distress.  Eyes: No icterus. Conjunctivae pink. Mouth: Oropharyngeal mucosa moist and pink , no lesions erythema or exudate. Lungs: Clear to auscultation bilaterally. Non-labored. Heart: Regular rate and rhythm, no murmurs rubs or gallops.  Abdomen: Bowel sounds are normal, nontender, nondistended, no hepatosplenomegaly or masses, no abdominal bruits or hernia , no rebound or guarding.   Extremities: No lower extremity edema. No clubbing  or deformities. Neuro: Alert and oriented x 3.  Grossly intact. Skin: Warm and dry, no jaundice.   Psych: Alert and cooperative, normal mood and affect.  Labs:    Imaging Studies: Abdomen 1 View (kub)  07/12/2015   CLINICAL DATA:  Kidney stones.  EXAM: ABDOMEN - 1 VIEW  COMPARISON:  01/18/2015.  FINDINGS: Multiple bilateral renal calyceal small stones are again noted. Similar findings noted on prior exam. No evidence of urolithiasis. Calcified pelvic density on the left consistent phlebolith. No bowel distention. Degenerative changes lumbar spine both hips.  IMPRESSION: Multiple bilateral small renal calyceal stones are again noted. Similar findings noted on prior exam. No evidence of urolithiasis.   Electronically Signed   By: Marcello Moores  Register   On: 07/12/2015 15:51   US Liver Elastography  07/14/2015   CLINICAL DATA:  Hepatic cirrhosis with ascites.  EXAM: ULTRASOUND HEPATIC ELASTOGRAPHY  TECHNIQUE: Ultrasound elastography evaluation of the liver was performed. A region of interest was placed in the right lobe of the liver. Following application of a compressive sonographic pulse, shear waves were detected in the adjacent hepatic tissue and the shear wave velocity was calculated. Multiple assessments were performed at the selected site. Median shear wave velocity is correlated to a Metavir fibrosis score.  COMPARISON:  Abdomen ultrasound on 07/13/2015  FINDINGS: Device: Siemens Helix VTQ  Transducer 6C 1  Patient position: Left Oblique  Number of measurements: 10  Hepatic segment:  8  Median velocity:   1.21  m/sec  IQR: 0.24  IQR/Median velocity ratio: 0.198  Corresponding Metavir fibrosis score:  F2 +some F3  Risk of fibrosis: Moderate  Limitations of exam: None  Pertinent findings noted  on other imaging exams: Coarse hepatic echotexture and mild capsular nodularity on recent ultrasound, suspicious for cirrhosis.  Please note that abnormal shear wave velocities may also be identified in clinical  settings other than with hepatic fibrosis, such as: acute hepatitis, elevated right heart and central venous pressures including use of beta blockers, veno-occlusive disease (Budd-Chiari), infiltrative processes such as mastocytosis/amyloidosis/infiltrative tumor, extrahepatic cholestasis, in the post-prandial state, and liver transplantation. Correlation with patient history, laboratory data, and clinical condition recommended.  IMPRESSION: Median hepatic shear wave velocity is calculated at 1.21 m/sec.  Corresponding Metavir fibrosis score is  F2 +some F3.  Risk of fibrosis is moderate.  Follow-up: Additional testing appropriate   Electronically Signed   By: Earle Gell M.D.   On: 07/14/2015 11:19   US Abdomen Limited Ruq  07/13/2015   CLINICAL DATA:  Cirrhosis.  EXAM: US ABDOMEN LIMITED - RIGHT UPPER QUADRANT  COMPARISON:  CT 01/14/2015 .  FINDINGS: Gallbladder:  No gallstones or wall thickening visualized. No sonographic Murphy sign noted.  Common bile duct:  Diameter: 3.4 mm  Liver:  Coarse hepatic echotexture with nodular borders consistent with patient's known cirrhosis. No focal hepatic abnormality identified.  IMPRESSION: 1. No gallstones or biliary distention. 2. Coarse hepatic echotexture with slightly nodular hepatic borders consistent patient's known cirrhosis. No focal hepatic abnormality identified.   Electronically Signed   By: Marcello Moores  Register   On: 07/13/2015 10:09    Assessment and Plan:   Kenneth Brown is a 64 y.o. y/o male  Comes today with Thrombocytopenia and imaging consistent with cirrhosis. The patient's liver enzymes continue to remain elevated. He will be set up for a trans jugular liver biopsy. The patient has been explained the plan and agrees with it   Note: This dictation was prepared with Dragon dictation along with smaller phrase technology. Any transcriptional errors that result from this process are unintentional.

## 2015-07-22 ENCOUNTER — Other Ambulatory Visit: Payer: Self-pay

## 2015-07-22 DIAGNOSIS — K7460 Unspecified cirrhosis of liver: Secondary | ICD-10-CM

## 2015-07-22 DIAGNOSIS — R748 Abnormal levels of other serum enzymes: Secondary | ICD-10-CM

## 2015-07-25 ENCOUNTER — Telehealth: Payer: Self-pay

## 2015-07-25 NOTE — Telephone Encounter (Signed)
Pt scheduled for a transjugular liver biopsy on Thursday, Oct 13th @ 7:00am. Pt and wife have been notified of appt date and time.

## 2015-07-26 ENCOUNTER — Other Ambulatory Visit: Payer: Self-pay | Admitting: Gastroenterology

## 2015-08-09 ENCOUNTER — Ambulatory Visit: Payer: PPO | Admitting: Family Medicine

## 2015-08-10 ENCOUNTER — Other Ambulatory Visit: Payer: Self-pay | Admitting: Radiology

## 2015-08-11 ENCOUNTER — Ambulatory Visit: Admission: RE | Admit: 2015-08-11 | Payer: PPO | Source: Ambulatory Visit

## 2015-08-12 ENCOUNTER — Inpatient Hospital Stay: Payer: PPO

## 2015-08-12 ENCOUNTER — Inpatient Hospital Stay: Payer: PPO | Admitting: Hematology and Oncology

## 2015-08-18 ENCOUNTER — Other Ambulatory Visit: Payer: Self-pay | Admitting: Radiology

## 2015-08-19 ENCOUNTER — Ambulatory Visit
Admission: RE | Admit: 2015-08-19 | Discharge: 2015-08-19 | Disposition: A | Discharge status: Home / Self Care | Payer: PPO | Source: Ambulatory Visit | Attending: Gastroenterology | Admitting: Gastroenterology

## 2015-08-19 DIAGNOSIS — K7460 Unspecified cirrhosis of liver: Secondary | ICD-10-CM | POA: Diagnosis present

## 2015-08-19 DIAGNOSIS — R945 Abnormal results of liver function studies: Secondary | ICD-10-CM | POA: Diagnosis present

## 2015-08-19 DIAGNOSIS — R748 Abnormal levels of other serum enzymes: Secondary | ICD-10-CM

## 2015-08-19 LAB — PROTIME-INR
INR: 1.17
PROTHROMBIN TIME: 15.1 s — AB (ref 11.4–15.0)

## 2015-08-19 LAB — CBC
HCT: 45.5 % (ref 40.0–52.0)
Hemoglobin: 15.4 g/dL (ref 13.0–18.0)
MCH: 30 pg (ref 26.0–34.0)
MCHC: 33.8 g/dL (ref 32.0–36.0)
MCV: 88.7 fL (ref 80.0–100.0)
PLATELETS: 70 10*3/uL — AB (ref 150–440)
RBC: 5.14 MIL/uL (ref 4.40–5.90)
RDW: 15 % — ABNORMAL HIGH (ref 11.5–14.5)
WBC: 4.7 10*3/uL (ref 3.8–10.6)

## 2015-08-19 LAB — APTT: APTT: 29 s (ref 24–36)

## 2015-08-19 MED ORDER — SODIUM CHLORIDE 0.9 % IV SOLN: INTRAVENOUS | Status: DC

## 2015-08-19 MED ORDER — MIDAZOLAM HCL 5 MG/5ML IJ SOLN
INTRAMUSCULAR | Status: AC
  Filled 2015-08-19: qty 5

## 2015-08-19 MED ORDER — FENTANYL CITRATE (PF) 100 MCG/2ML IJ SOLN
INTRAMUSCULAR | Status: AC | PRN
  Administered 2015-08-19: 50 ug via INTRAVENOUS

## 2015-08-19 MED ORDER — FENTANYL CITRATE (PF) 100 MCG/2ML IJ SOLN
INTRAMUSCULAR | Status: AC
  Filled 2015-08-19: qty 2

## 2015-08-19 MED ORDER — HYDROCODONE-ACETAMINOPHEN 5-325 MG PO TABS: 1.0000 | ORAL_TABLET | ORAL | Status: DC | PRN

## 2015-08-19 MED ORDER — MIDAZOLAM HCL 2 MG/2ML IJ SOLN
INTRAMUSCULAR | Status: AC | PRN
  Administered 2015-08-19: 1 mg via INTRAVENOUS

## 2015-08-19 NOTE — Procedures (Signed)
Interventional Radiology Procedure Note  Procedure:  US guided core liver biopsy  Complications:  None  Estimated Blood Loss: < 10 mL  18 G core biopsy x 3 via 17 G needle in right lobe of liver.  Good samples.  No immediate complications.  Plan:  3 hr recovery on bedrest.  Eulas Post T. Kathlene Cote, M.D Pager:  [phone]

## 2015-08-19 NOTE — Discharge Instructions (Signed)
Liver Biopsy The liver is a large organ in the upper right-hand side of your abdomen. A liver biopsy is a procedure in which a tissue sample is taken from the liver and examined under a microscope. The procedure is done to confirm a suspected problem. There are three types of liver biopsies:  Percutaneous. In this type, an incision is made in your abdomen. The sample is removed through the incision with a needle.  Laparoscopic. In this type, several incisions are made in the abdomen. A tiny camera is passed through one of the incisions to help guide the health care provider. The sample is removed through the other incision or incisions.  Transjugular. In this type, an incision is made in the neck. A tube is passed through the incision to the liver. The sample is removed through the tube with a needle. LET [facility] CARE PROVIDER KNOW ABOUT:  Any allergies you have.  All medicines you are taking, including vitamins, herbs, eye drops, creams, and over-the-counter medicines.  Previous problems you or members of your family have had with the use of anesthetics.  Any blood disorders you have.  Previous surgeries you have had.  Medical conditions you have.  Possibility of pregnancy, if this applies. RISKS AND COMPLICATIONS Generally, this is a safe procedure. However, problems can occur and include:  Bleeding.  Infection.  Bruising.  Collapsed lung.  Leak of digestive juices (bile) from the liver or gallbladder.  Problems with heart rhythm.  Pain at the biopsy site or in the right shoulder.  Low blood pressure (hypotension).  Injury to nearby organs or tissues. BEFORE THE PROCEDURE  Your health care provider may do some blood or urine tests. These will help your health care provider learn how well your kidneys and liver are working and how well your blood clots.  Ask your health care provider if you will be able to go home the day of the procedure. Arrange for someone to  take you home and stay with you for at least 24 hours.  Do not eat or drink anything after midnight on the night before the procedure or as directed by your health care provider.  Ask your health care provider about:  Changing or stopping your regular medicines. This is especially important if you are taking diabetes medicines or blood thinners.  Taking medicines such as aspirin and ibuprofen. These medicines can thin your blood. Do not take these medicines before your procedure if your health care provider asks you not to. PROCEDURE Regardless of the type of biopsy that will be done, you will have an IV line placed. Through this line, you will receive fluids and medicine to relax you. If you will be having a laparoscopic biopsy, you may also receive medicine through this line to make you sleep during the procedure (general anesthetic). Percutaneous Liver Biopsy  You will positioned on your back, with your right hand over your head.  A health care provider will locate your liver by tapping and pressing on the right side of your abdomen or with the help of an ultrasound machine or CT scan.  An area at the bottom of your last right rib will be numbed.  An incision will be made in the numbed area.  The biopsy needle will be inserted into the incision.  Several samples of liver tissue will be taken with the biopsy needle. You will be asked to hold your breath as each sample is taken. Laparoscopic Liver Biopsy  You will be  positioned on your back.  Several small incisions will be made in your abdomen.  Your doctor will pass a tiny camera through one incision. The camera will allow the liver to be viewed on a TV monitor in the operating room.  Tools will be passed through the other incision or incisions. These tools will be used to remove samples of liver tissue. Transjugular Liver Biopsy  You will be positioned on your back on an X-ray table, with your head turned to your left.  An  area on your neck just over your jugular vein will be numbed.  An incision will be made in the numbed area.  A tiny tube will be inserted through the incision. It will be pushed through the jugular vein to a blood vessel in the liver called the hepatic vein.  Dye will be inserted through the tube, and X-rays will be taken. The dye will make the blood vessels in the liver light up on the X-rays.  The biopsy needle will be pushed through the tube until it reaches the liver.  Samples of liver tissue will be taken with the biopsy needle.  The needle and the tube will be removed. After the samples are obtained, the incision or incisions will be closed. AFTER THE PROCEDURE  You will be taken to a recovery area.  You may have to lie on your right side for 1-2 hours. This will prevent bleeding from the biopsy site.  Your progress will be watched. Your blood pressure, pulse, and the biopsy site will be checked often.  You may have some pain or feel sick. If this happens, tell your health care provider.  As you begin to feel better, you will be offered ice and beverages.  You may be allowed to go home when the medicines have worn off and you can walk, drink, eat, and use the bathroom.   This information is not intended to replace advice given to you by your health care provider. Make sure you discuss any questions you have with your health care provider.   Document Released: 01/05/2004 Document Revised: 11/05/2014 Document Reviewed: 12/11/2013 Elsevier Interactive Patient Education [employer].

## 2015-08-22 LAB — SURGICAL PATHOLOGY

## 2015-08-24 ENCOUNTER — Telehealth: Payer: Self-pay

## 2015-08-24 NOTE — Telephone Encounter (Signed)
Pt scheduled for follow up liver biopsy result on Tues, Nov 8th at 2:45pm in [place] office.

## 2015-08-24 NOTE — Telephone Encounter (Signed)
-----   Message from Lucilla Lame, MD sent at 08/24/2015  7:55 AM EDT ----- Please have the patient come in for a follow up.

## 2015-08-26 ENCOUNTER — Inpatient Hospital Stay: Payer: PPO

## 2015-08-26 ENCOUNTER — Encounter: Payer: Self-pay | Admitting: Hematology and Oncology

## 2015-08-26 ENCOUNTER — Inpatient Hospital Stay: Payer: PPO | Attending: Hematology and Oncology | Admitting: Hematology and Oncology

## 2015-08-26 DIAGNOSIS — Z87442 Personal history of urinary calculi: Secondary | ICD-10-CM

## 2015-08-26 DIAGNOSIS — K7460 Unspecified cirrhosis of liver: Secondary | ICD-10-CM | POA: Diagnosis not present

## 2015-08-26 DIAGNOSIS — N3000 Acute cystitis without hematuria: Secondary | ICD-10-CM | POA: Diagnosis not present

## 2015-08-26 DIAGNOSIS — Z87440 Personal history of urinary (tract) infections: Secondary | ICD-10-CM

## 2015-08-26 DIAGNOSIS — R161 Splenomegaly, not elsewhere classified: Secondary | ICD-10-CM

## 2015-08-26 DIAGNOSIS — D696 Thrombocytopenia, unspecified: Secondary | ICD-10-CM

## 2015-08-26 DIAGNOSIS — I8500 Esophageal varices without bleeding: Secondary | ICD-10-CM

## 2015-08-26 DIAGNOSIS — E538 Deficiency of other specified B group vitamins: Secondary | ICD-10-CM

## 2015-08-26 DIAGNOSIS — K319 Disease of stomach and duodenum, unspecified: Secondary | ICD-10-CM

## 2015-08-26 DIAGNOSIS — K769 Liver disease, unspecified: Secondary | ICD-10-CM

## 2015-08-26 DIAGNOSIS — R310 Gross hematuria: Secondary | ICD-10-CM

## 2015-08-26 DIAGNOSIS — K621 Rectal polyp: Secondary | ICD-10-CM

## 2015-08-26 DIAGNOSIS — E119 Type 2 diabetes mellitus without complications: Secondary | ICD-10-CM

## 2015-08-26 LAB — CBC WITH DIFFERENTIAL/PLATELET
Basophils Absolute: 0 10*3/uL (ref 0–0.1)
Basophils Relative: 1 %
Eosinophils Absolute: 0.1 10*3/uL (ref 0–0.7)
Eosinophils Relative: 2 %
HCT: 46.5 % (ref 40.0–52.0)
Hemoglobin: 15.9 g/dL (ref 13.0–18.0)
Lymphocytes Relative: 17 %
Lymphs Abs: 1.1 10*3/uL (ref 1.0–3.6)
MCH: 29.9 pg (ref 26.0–34.0)
MCHC: 34.2 g/dL (ref 32.0–36.0)
MCV: 87.4 fL (ref 80.0–100.0)
Monocytes Absolute: 0.4 10*3/uL (ref 0.2–1.0)
Monocytes Relative: 6 %
Neutro Abs: 4.8 10*3/uL (ref 1.4–6.5)
Neutrophils Relative %: 74 %
Platelets: 79 10*3/uL — ABNORMAL LOW (ref 150–440)
RBC: 5.32 MIL/uL (ref 4.40–5.90)
RDW: 14.9 % — ABNORMAL HIGH (ref 11.5–14.5)
WBC: 6.4 10*3/uL (ref 3.8–10.6)

## 2015-08-26 LAB — VITAMIN B12: Vitamin B-12: 284 pg/mL (ref 180–914)

## 2015-08-26 MED ORDER — CYANOCOBALAMIN 1000 MCG/ML IJ SOLN
1000.0000 ug | Freq: Once | INTRAMUSCULAR | Status: AC
  Administered 2015-08-26: 1000 ug via INTRAMUSCULAR
  Filled 2015-08-26: qty 1

## 2015-08-26 NOTE — Progress Notes (Signed)
Patient is here for follow-up of thrombocytopenia and B12 deficiency. Patient had liver bx on October 21st by Dr. Carola Rhine. It was ordered by Dr. Allen Norris. Patient states that he has been doing good since his bx.

## 2015-08-26 NOTE — Progress Notes (Signed)
[place] [facility] day:  08/26/2015  Chief Complaint: Kenneth Brown is a 64 y.o. male with thrombocytopenia who is seen for assessment after interval liver biopsy.  HPI: The patient was last seen in the medical oncology clinic on 07/15/2015.  At that time, he felt good.  Exam revealed a palpable spleen.  Platelet count was 76,000.  He has seen Dr. Allen Norris for assessment of his underlying liver disease. 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  Blood work by Dr. Allen Norris did not reveal an etiology to his liver disease.  Hepatic elastography on 07/14/2015 revealed a median shear wave velocity of 1.2 m/sec corresponding to a Metavir fibrosis score of F2 + some F3.   Risk of fibrosis was moderate. Consideration was being made for a liver biopsy.  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  Given the negative findings, diagnostic considerations included an adverse drug/herbal reaction, vascular disorders, autoimmune disease, viral hepatitis, and partial bile duct obstruction.   Symptomatically he feels good.  He denies any complaints.  He denies any bruising or bleeding.  Past Medical History  Diagnosis Date  . UTI (urinary tract infection) 10/2014  . Diabetes mellitus without complication     pre diabetic  . Renal calculus, bilateral   . Difficulty urinating   . Acute cystitis   . Gross hematuria   . Platelets decreased     Past Surgical History  Procedure Laterality Date  . Ankle surgery  08/28/12  . Orif ankle fracture Right     plate and screws, pt still has  . Ureteroscopy with holmium laser lithotripsy Right 03/21/2015    Procedure: URETEROSCOPY WITH HOLMIUM LASER LITHOTRIPSY,retrograde pyelogram,stent placement;  Surgeon: Collier Flowers, MD;  Location: [facility];  Service: Urology;  Laterality: Right;  .  Colonoscopy with propofol N/A 07/08/2015    Procedure: COLONOSCOPY WITH PROPOFOL;  Surgeon: Lucilla Lame, MD;  Location: [place];  Service: Endoscopy;  Laterality: N/A;  . Esophagogastroduodenoscopy (egd) with propofol N/A 07/08/2015    Procedure: ESOPHAGOGASTRODUODENOSCOPY (EGD) WITH PROPOFOL;  Surgeon: Lucilla Lame, MD;  Location: [place];  Service: Endoscopy;  Laterality: N/A;  . Polypectomy  07/08/2015    Procedure: POLYPECTOMY;  Surgeon: Lucilla Lame, MD;  Location: [place];  Service: Endoscopy;;    Family History  Problem Relation Age of Onset  . Hypertension Mother   . Heart disease Mother   . Stroke Mother   . COPD Mother   . Heart disease Father   . Heart attack Father   . Lung cancer Father   . Skin cancer Father     Social History:  reports that he has never smoked. He has never used smokeless tobacco. He reports that he drinks alcohol. He reports that he does not use illicit drugs.  He is accompanied by his wife and granddaughter.  Allergies: No Known Allergies  Current Medications: Current Outpatient Prescriptions  Medication Sig Dispense Refill  . naproxen (NAPROSYN) 250 MG tablet Take 500 mg by mouth as needed for mild pain or moderate pain.      No current facility-administered medications for this visit.   Review of Systems:  GENERAL:  Feels good.  No fevers, sweats or weight loss. PERFORMANCE STATUS (ECOG):  0 HEENT:  No visual changes, sore throat, mouth sores or tenderness. Lungs: No shortness of breath  or cough.  No hemoptysis. Cardiac:  No chest pain, palpitations, orthopnea, or PND. GI:  No nausea, vomiting, diarrhea, constipation, melena or hematochezia.  Colonoscopy 2015. GU:  No urgency, frequency, dysuria, or hematuria. Musculoskeletal:  No back pain.  Ankle pain.  No muscle tenderness. Extremities:  No pain or swelling. Skin:  No rashes or skin changes. Neuro:  No headache, numbness or weakness, balance or coordination  issues. Endocrine:  No diabetes, thyroid issues, hot flashes or night sweats. Psych:  No mood changes, depression or anxiety. Pain:  No focal pain. Review of systems:  All other systems reviewed and found to be negative.   Physical Exam: Blood pressure 124/77, pulse 79, temperature 97.7 F (36.5 C), temperature source Tympanic, resp. rate 18, height 5\' 10"  (1.778 m), weight 239 lb 6.7 oz (108.6 kg). GENERAL:  Well developed, well nourished, gentleman sitting comfortably in the exam room in no acute distress. MENTAL STATUS:  Alert and oriented to person, place and time. HEAD:  Pearline Cables hair.  Normocephalic, atraumatic, face symmetric, no Cushingoid features. EYES:  Blue eyes.  Pupils equal round and reactive to light and accomodation.  No conjunctivitis or scleral icterus. ENT:  Oropharynx clear without lesion.  Tongue normal. Mucous membranes moist.  RESPIRATORY:  Clear to auscultation without rales, wheezes or rhonchi. CARDIOVASCULAR:  Regular rate and rhythm without murmur, rub or gallop. ABDOMEN:  Soft, non-tender, with active bowel sounds, and no hepatomegaly.  Spleen palpable.  No masses. SKIN:  Liver biopsy site unremarkable except for tape irritation.  No rashes, ulcers or lesions. EXTREMITIES: No edema, no skin discoloration or tenderness.  No palpable cords. LYMPH NODES: No palpable cervical, supraclavicular, axillary or inguinal adenopathy  NEUROLOGICAL: Unremarkable. PSYCH:  Appropriate.   Appointment on 08/26/2015  Component Date Value Ref Range Status  . WBC 08/26/2015 6.4  3.8 - 10.6 K/uL Final  . RBC 08/26/2015 5.32  4.40 - 5.90 MIL/uL Final  . Hemoglobin 08/26/2015 15.9  13.0 - 18.0 g/dL Final  . HCT 08/26/2015 46.5  40.0 - 52.0 % Final  . MCV 08/26/2015 87.4  80.0 - 100.0 fL Final  . MCH 08/26/2015 29.9  26.0 - 34.0 pg Final  . MCHC 08/26/2015 34.2  32.0 - 36.0 g/dL Final  . RDW 08/26/2015 14.9* 11.5 - 14.5 % Final  . Platelets 08/26/2015 79* 150 - 440 K/uL Final  .  Neutrophils Relative % 08/26/2015 74   Final  . Neutro Abs 08/26/2015 4.8  1.4 - 6.5 K/uL Final  . Lymphocytes Relative 08/26/2015 17   Final  . Lymphs Abs 08/26/2015 1.1  1.0 - 3.6 K/uL Final  . Monocytes Relative 08/26/2015 6   Final  . Monocytes Absolute 08/26/2015 0.4  0.2 - 1.0 K/uL Final  . Eosinophils Relative 08/26/2015 2   Final  . Eosinophils Absolute 08/26/2015 0.1  0 - 0.7 K/uL Final  . Basophils Relative 08/26/2015 1   Final  . Basophils Absolute 08/26/2015 0.0  0 - 0.1 K/uL Final    Assessment:  Kenneth Brown is a 64 y.o. male with a history of mild thrombocytopenia dating back to 2013.  Platelet count over the past 6 months has ranged between 59,000 and 74,000 without trend.  Abdominal and pelvic CT scan on 01/14/2015 revealed a nodular liver and moderate splenomegaly (18 cm).  He denied any new medications or herbal products.  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  He also had dverticulosis and a 7 mm polyp in the rectum.  Pathology from his gastric biopsy revealed reactive gastropathy. There was a tubular adenoma in the rectum.  Work-up on 05/06/2015 revealed B12 deficiency.  CBC revealed a hematocrit of 48.3, hemoglobin 16.1, platelets 67,000, WBC 5500 with an ANC of 3900.  Differential was unremarkable.  B12 was 142 (low).  Folate was 11.1 (normal).  Hepatitis B core antibody total, hepatitis B surface antigen, hepatitis C antibody, and HIV testing was negative.  SPEP, ANA, lupus anticoagulant, TSH, and LDH were normal.  Negative studies on 06/28/2015. Included an AFP (2.2), mitochondrial M2 antibody, F-Acton IgG, immunoglobulins, ceruloplasmin, ferritin (39), and iron studies. Bilirubin was 1.7, AST 49, ALT 32, and alkaline phosphatase 73.  Abdominal  ultrasound on 07/13/2015 revealed coarse hepatic echotexture was slightly nodular hepatic borders consistent with cirrhosis.  Hepatic elastography on 07/14/2015 revealed a median shear wave velocity of 1.2 m/sec corresponding to a Metavir fibrosis score of F2 + some F3.   Risk of fibrosis was moderate.   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  Given the negative findings, diagnostic considerations included an adverse drug/herbal reaction, vascular disorders, autoimmune disease, viral hepatitis, and partial bile duct obstruction.   He received B12 weekly x 6 (07/13 - 06/17/2015).  He receives monthly B12 (last 07/15/2015).  Symptomatically, he feels well.  Exam reveals a palpable spleen.  Platelet count is stable (79,000).  Plan: 1. Labs today:  CBC with diff, B12 level. 2. B12 today and monthly. 3. Follow-up with Dr Allen Norris regarding underlying liver disease. 4. RTC in 3 months for platelet count and B12 injection. 5. RTC in 6 months for MD assess and labs (CBC with diff).   Lequita Asal, MD  08/26/2015, 2:37 PM

## 2015-09-06 ENCOUNTER — Ambulatory Visit (INDEPENDENT_AMBULATORY_CARE_PROVIDER_SITE_OTHER): Payer: PPO | Admitting: Gastroenterology

## 2015-09-06 ENCOUNTER — Encounter: Payer: Self-pay | Admitting: Gastroenterology

## 2015-09-06 VITALS — BP 133/70 | HR 91 | Temp 98.1°F | Ht 70.0 in | Wt 240.0 lb

## 2015-09-06 DIAGNOSIS — K7469 Other cirrhosis of liver: Secondary | ICD-10-CM

## 2015-09-06 NOTE — Progress Notes (Signed)
Primary Care Physician: Wilhemena Durie, MD  Primary Gastroenterologist:  Dr. Lucilla Lame  Chief Complaint  Patient presents with  . Follow-up    Go over liver biopsy    HPI: Kenneth Brown is a 64 y.o. male here for follow-up after having a liver biopsy. The patient's liver biopsy showed the liver to have a mixed hepatic and biliary cause of the inflammation. It was reported as nonspecific without any specific diagnosis as the cause to the patient's abnormal liver enzymes. Patient had a fibrosis scan that showed the liver to be F2 to [facility]. The biopsies showed F2 with imaging being more consistent with cirrhosis. He also has significant thrombocytopenia. The patient's workup has been negative for any autoimmune disease or hemachromatosis or symptoms disease.  Current Outpatient Prescriptions  Medication Sig Dispense Refill  . naproxen (NAPROSYN) 250 MG tablet Take 500 mg by mouth as needed for mild pain or moderate pain.      No current facility-administered medications for this visit.    Allergies as of 09/06/2015  . (No Known Allergies)    ROS:  General: Negative for anorexia, weight loss, fever, chills, fatigue, weakness. ENT: Negative for hoarseness, difficulty swallowing , nasal congestion. CV: Negative for chest pain, angina, palpitations, dyspnea on exertion, peripheral edema.  Respiratory: Negative for dyspnea at rest, dyspnea on exertion, cough, sputum, wheezing.  GI: See history of present illness. GU:  Negative for dysuria, hematuria, urinary incontinence, urinary frequency, nocturnal urination.  Endo: Negative for unusual weight change.    Physical Examination:   BP 133/70 mmHg  Pulse 91  Temp(Src) 98.1 F (36.7 C) (Oral)  Ht 5\' 10"  (1.778 m)  Wt 240 lb (108.863 kg)  BMI 34.44 kg/m2  General: Well-nourished, well-developed in no acute distress.  Eyes: No icterus. Conjunctivae pink. Extremities: No lower extremity edema. No clubbing or  deformities. Neuro: Alert and oriented x 3.  Grossly intact. Skin: Warm and dry, no jaundice.   Psych: Alert and cooperative, normal mood and affect.  Labs:    Imaging Studies: US Biopsy  08/19/2015  CLINICAL DATA:  Elevated liver enzymes and abnormal hepatic elastography with ultrasound demonstrating probable cirrhotic changes of the liver. The patient requires liver biopsy for further histologic evaluation. EXAM: ULTRASOUND GUIDED CORE BIOPSY OF LIVER MEDICATIONS: 1.0 mg IV Versed; 50 mcg IV Fentanyl Total Moderate Sedation Time: 16 minutes. PROCEDURE: The procedure, risks, benefits, and alternatives were explained to the patient. Questions regarding the procedure were encouraged and answered. The patient understands and consents to the procedure. A time-out was performed prior to the procedure. The right abdominal wall was prepped with chlorhexidine in a sterile fashion, and a sterile drape was applied covering the operative field. A sterile gown and sterile gloves were used for the procedure. Local anesthesia was provided with 1% Lidocaine. Ultrasound was used to localize the liver. Under direct ultrasound guidance, a 17 gauge needle was advanced into the right lobe. A total of 3 coaxial 18 gauge core biopsy samples were obtained and placed in formalin. After the procedure, Gel-Foam pledgets were advanced through the outer needle as the needle was retracted. Additional ultrasound was performed after needle removal. COMPLICATIONS: None. FINDINGS: Solid tissue samples were obtained from the liver. There was no evidence of immediate bleeding complication. IMPRESSION: Ultrasound-guided core biopsy performed of the liver. Electronically Signed   By: Aletta Edouard M.D.   On: 08/19/2015 11:56    Assessment and Plan:   Kenneth Brown is a 64 y.o.  y/o male who comes here for follow-up of his liver biopsy. The liver biopsy was nonspecific for the cause of his liver disease but did say that there was  a mixed pattern sailor and biliary cause. The patient has been explained that the biopsy did not give me much insight into why his liver enzymes have been elevated and why he has cirrhosis. The patient will be sent for further evaluation at [facility] liver clinic. The patient has been explained the plan and agrees with it.   Note: This dictation was prepared with Dragon dictation along with smaller phrase technology. Any transcriptional errors that result from this process are unintentional.

## 2015-09-26 ENCOUNTER — Inpatient Hospital Stay: Payer: PPO | Attending: Hematology and Oncology

## 2015-09-26 ENCOUNTER — Other Ambulatory Visit: Payer: Self-pay | Admitting: Hematology and Oncology

## 2015-09-26 ENCOUNTER — Inpatient Hospital Stay: Payer: PPO

## 2015-09-26 DIAGNOSIS — Z79899 Other long term (current) drug therapy: Secondary | ICD-10-CM | POA: Diagnosis not present

## 2015-09-26 DIAGNOSIS — E538 Deficiency of other specified B group vitamins: Secondary | ICD-10-CM | POA: Diagnosis not present

## 2015-09-26 MED ORDER — CYANOCOBALAMIN 1000 MCG/ML IJ SOLN
1000.0000 ug | Freq: Once | INTRAMUSCULAR | Status: AC
  Administered 2015-09-26: 1000 ug via INTRAMUSCULAR
  Filled 2015-09-26: qty 1

## 2015-09-28 ENCOUNTER — Telehealth: Payer: Self-pay | Admitting: Gastroenterology

## 2015-09-28 NOTE — Telephone Encounter (Signed)
I have faxed clinic notes, imaging and labs to [facility] @ [phone]. Reason for referral: nonspecific cause for liver disease on liver biopsy. [facility] will call the patient once records have been reviewed to make an appointment.

## 2015-09-28 NOTE — Telephone Encounter (Signed)
Noted! Thank you

## 2015-09-30 ENCOUNTER — Ambulatory Visit: Payer: PPO

## 2015-10-03 ENCOUNTER — Telehealth: Payer: Self-pay | Admitting: *Deleted

## 2015-10-03 NOTE — Telephone Encounter (Signed)
Got an EOB from insurance and sees 2 charges on it and he only gets B12 inj the second charge is for $45 and she wants to know what it is for. Please call her

## 2015-10-28 ENCOUNTER — Ambulatory Visit: Payer: PPO

## 2015-10-28 ENCOUNTER — Inpatient Hospital Stay: Payer: PPO | Attending: Hematology and Oncology

## 2015-10-28 DIAGNOSIS — E538 Deficiency of other specified B group vitamins: Secondary | ICD-10-CM | POA: Diagnosis not present

## 2015-10-28 DIAGNOSIS — Z79899 Other long term (current) drug therapy: Secondary | ICD-10-CM | POA: Diagnosis not present

## 2015-10-28 MED ORDER — CYANOCOBALAMIN 1000 MCG/ML IJ SOLN
1000.0000 ug | Freq: Once | INTRAMUSCULAR | Status: AC
  Administered 2015-10-28: 1000 ug via INTRAMUSCULAR
  Filled 2015-10-28: qty 1

## 2015-11-11 ENCOUNTER — Other Ambulatory Visit: Payer: Self-pay

## 2015-11-11 DIAGNOSIS — E538 Deficiency of other specified B group vitamins: Secondary | ICD-10-CM

## 2015-11-18 ENCOUNTER — Inpatient Hospital Stay: Payer: PPO

## 2015-11-18 ENCOUNTER — Ambulatory Visit: Payer: PPO

## 2015-11-18 ENCOUNTER — Other Ambulatory Visit: Payer: PPO

## 2015-11-18 ENCOUNTER — Other Ambulatory Visit: Payer: Self-pay | Admitting: Hematology and Oncology

## 2015-11-18 ENCOUNTER — Inpatient Hospital Stay: Payer: PPO | Attending: Hematology and Oncology

## 2015-11-18 DIAGNOSIS — E538 Deficiency of other specified B group vitamins: Secondary | ICD-10-CM

## 2015-11-18 DIAGNOSIS — Z79899 Other long term (current) drug therapy: Secondary | ICD-10-CM | POA: Diagnosis not present

## 2015-11-18 LAB — CBC WITH DIFFERENTIAL/PLATELET
Basophils Absolute: 0 10*3/uL (ref 0–0.1)
Basophils Relative: 1 %
Eosinophils Absolute: 0.1 10*3/uL (ref 0–0.7)
Eosinophils Relative: 3 %
HCT: 46.9 % (ref 40.0–52.0)
Hemoglobin: 15.8 g/dL (ref 13.0–18.0)
Lymphocytes Relative: 19 %
Lymphs Abs: 0.9 10*3/uL — ABNORMAL LOW (ref 1.0–3.6)
MCH: 29.7 pg (ref 26.0–34.0)
MCHC: 33.7 g/dL (ref 32.0–36.0)
MCV: 87.9 fL (ref 80.0–100.0)
Monocytes Absolute: 0.2 10*3/uL (ref 0.2–1.0)
Monocytes Relative: 5 %
Neutro Abs: 3.2 10*3/uL (ref 1.4–6.5)
Neutrophils Relative %: 72 %
Platelets: 75 10*3/uL — ABNORMAL LOW (ref 150–440)
RBC: 5.33 MIL/uL (ref 4.40–5.90)
RDW: 15.6 % — ABNORMAL HIGH (ref 11.5–14.5)
WBC: 4.5 10*3/uL (ref 3.8–10.6)

## 2015-11-18 MED ORDER — CYANOCOBALAMIN 1000 MCG/ML IJ SOLN
1000.0000 ug | Freq: Once | INTRAMUSCULAR | Status: AC
  Administered 2015-11-18: 1000 ug via INTRAMUSCULAR
  Filled 2015-11-18: qty 1

## 2015-11-25 ENCOUNTER — Ambulatory Visit: Payer: PPO

## 2015-12-07 DIAGNOSIS — K7460 Unspecified cirrhosis of liver: Secondary | ICD-10-CM | POA: Diagnosis not present

## 2015-12-16 ENCOUNTER — Ambulatory Visit: Payer: PPO

## 2015-12-16 ENCOUNTER — Inpatient Hospital Stay: Payer: PPO | Attending: Hematology and Oncology

## 2015-12-16 DIAGNOSIS — E538 Deficiency of other specified B group vitamins: Secondary | ICD-10-CM | POA: Diagnosis not present

## 2015-12-16 DIAGNOSIS — Z79899 Other long term (current) drug therapy: Secondary | ICD-10-CM | POA: Diagnosis not present

## 2015-12-16 MED ORDER — CYANOCOBALAMIN 1000 MCG/ML IJ SOLN
1000.0000 ug | Freq: Once | INTRAMUSCULAR | Status: AC
  Administered 2015-12-16: 1000 ug via INTRAMUSCULAR
  Filled 2015-12-16: qty 1

## 2015-12-19 ENCOUNTER — Inpatient Hospital Stay: Payer: PPO

## 2015-12-23 ENCOUNTER — Ambulatory Visit: Payer: PPO

## 2016-01-04 DIAGNOSIS — H25813 Combined forms of age-related cataract, bilateral: Secondary | ICD-10-CM | POA: Diagnosis not present

## 2016-01-13 ENCOUNTER — Ambulatory Visit: Payer: PPO

## 2016-01-14 ENCOUNTER — Other Ambulatory Visit: Payer: Self-pay | Admitting: Hematology and Oncology

## 2016-01-16 ENCOUNTER — Inpatient Hospital Stay: Payer: PPO | Attending: Hematology and Oncology

## 2016-01-16 VITALS — BP 146/85 | HR 81 | Temp 98.3°F

## 2016-01-16 DIAGNOSIS — E538 Deficiency of other specified B group vitamins: Secondary | ICD-10-CM | POA: Diagnosis not present

## 2016-01-16 DIAGNOSIS — Z79899 Other long term (current) drug therapy: Secondary | ICD-10-CM | POA: Diagnosis not present

## 2016-01-16 MED ORDER — CYANOCOBALAMIN 1000 MCG/ML IJ SOLN
1000.0000 ug | Freq: Once | INTRAMUSCULAR | Status: AC
Start: 2016-01-16 — End: 2016-01-16
  Administered 2016-01-16: 1000 ug via INTRAMUSCULAR
  Filled 2016-01-16: qty 1

## 2016-01-20 ENCOUNTER — Ambulatory Visit: Payer: PPO

## 2016-01-24 ENCOUNTER — Ambulatory Visit (INDEPENDENT_AMBULATORY_CARE_PROVIDER_SITE_OTHER): Payer: PPO | Admitting: Family Medicine

## 2016-01-24 VITALS — BP 122/80 | HR 80 | Temp 97.9°F | Resp 16 | Ht 69.5 in | Wt 242.0 lb

## 2016-01-24 DIAGNOSIS — Z23 Encounter for immunization: Secondary | ICD-10-CM

## 2016-01-24 DIAGNOSIS — E785 Hyperlipidemia, unspecified: Secondary | ICD-10-CM

## 2016-01-24 DIAGNOSIS — R7303 Prediabetes: Secondary | ICD-10-CM | POA: Diagnosis not present

## 2016-01-24 DIAGNOSIS — D649 Anemia, unspecified: Secondary | ICD-10-CM

## 2016-01-24 DIAGNOSIS — Z0000 Encounter for general adult medical examination without abnormal findings: Secondary | ICD-10-CM

## 2016-01-24 NOTE — Progress Notes (Signed)
Patient ID: Kenneth Brown, male   DOB: 05-Jun-1951, 65 y.o.   MRN: [id number] Patient: Kenneth Brown, Male    DOB: 07-24-1951, 65 y.o.   MRN: [id number] Visit Date: 01/24/2016  Today's Provider: Wilhemena Durie, MD   Chief Complaint  Patient presents with  . Medicare Wellness   Subjective:   Presten Beh is a 65 y.o. male who presents today for his Subsequent Annual Wellness Visit. He feels fairly well. He reports exercising none. He reports he is sleeping fairly well. He also needs follow-up of idiopathic thrombocytopenia.he also needs follow-up of type 2 diabetes and fatty liver disease. Overall he feels fairly well. He does not watch his diet nor does he exercise. Review of Systems  Constitutional: Negative.   HENT: Negative.   Eyes: Negative.   Respiratory: Negative.   Cardiovascular: Negative.   Gastrointestinal: Negative.   Endocrine: Negative.   Genitourinary: Negative.   Musculoskeletal: Negative.   Skin: Negative.   Allergic/Immunologic: Negative.   Neurological: Negative.   Hematological: Negative.   Psychiatric/Behavioral: Negative.     Patient Active Problem List   Diagnosis Date Noted  . B12 deficiency 05/07/2015  . Splenomegaly 05/06/2015  . Thrombocytopenia ([place]) 04/17/2015  . Abnormal liver enzymes 03/02/2015  . Allergic rhinitis 03/02/2015  . Blood pressure elevated 03/02/2015  . Borderline diabetes 03/02/2015  . History of colon polyps 03/02/2015  . Personal history of traumatic fracture 03/02/2015  . H/O disease 03/02/2015  . H/O renal calculi 03/02/2015  . Personal history of infectious and parasitic disease 03/02/2015  . HLD (hyperlipidemia) 03/02/2015  . Diabetes mellitus, type 2 ([place]) 03/02/2015  . Adiposity 03/02/2015  . Arthritis 10/12/2014    Social History   Social History  . Marital Status: Married    Spouse Name: N/A  . Number of Children: N/A  . Years of Education: N/A   Occupational History  . Not on file.    Social History Main Topics  . Smoking status: Never Smoker   . Smokeless tobacco: Never Used  . Alcohol Use: 0.0 - 0.6 oz/week    0-1 Cans of beer per week  . Drug Use: No  . Sexual Activity: Not on file   Other Topics Concern  . Not on file   Social History Narrative    Past Surgical History  Procedure Laterality Date  . Ankle surgery  08/28/12  . Orif ankle fracture Right     plate and screws, pt still has  . Ureteroscopy with holmium laser lithotripsy Right 03/21/2015    Procedure: URETEROSCOPY WITH HOLMIUM LASER LITHOTRIPSY,retrograde pyelogram,stent placement;  Surgeon: Collier Flowers, MD;  Location: [facility];  Service: Urology;  Laterality: Right;  . Colonoscopy with propofol N/A 07/08/2015    Procedure: COLONOSCOPY WITH PROPOFOL;  Surgeon: Lucilla Lame, MD;  Location: [place];  Service: Endoscopy;  Laterality: N/A;  . Esophagogastroduodenoscopy (egd) with propofol N/A 07/08/2015    Procedure: ESOPHAGOGASTRODUODENOSCOPY (EGD) WITH PROPOFOL;  Surgeon: Lucilla Lame, MD;  Location: [place];  Service: Endoscopy;  Laterality: N/A;  . Polypectomy  07/08/2015    Procedure: POLYPECTOMY;  Surgeon: Lucilla Lame, MD;  Location: [place];  Service: Endoscopy;;    His family history includes COPD in his mother; Heart attack in his father; Heart disease in his father and mother; Hypertension in his mother; Lung cancer in his father; Skin cancer in his father; Stroke in his mother.    Outpatient Prescriptions Prior to Visit  Medication Sig Dispense Refill  .  naproxen (NAPROSYN) 250 MG tablet Take 500 mg by mouth as needed for mild pain or moderate pain.      No facility-administered medications prior to visit.    No Known Allergies  Patient Care Team: Jerrol Banana., MD as PCP - General (Family Medicine) Lucilla Lame, MD as Consulting Physician (Gastroenterology)  Objective:   Vitals:  Filed Vitals:   01/24/16 1027  BP: 122/80  Pulse: 80   Temp: 97.9 F (36.6 C)  TempSrc: Oral  Resp: 16  Height: 5' 9.5" (1.765 m)  Weight: 242 lb (109.77 kg)    Physical Exam  Constitutional: He is oriented to person, place, and time. He appears well-developed and well-nourished.  Pleasant, obese white male in no acute distress.  HENT:  Head: Normocephalic and atraumatic.  Right Ear: External ear normal.  Left Ear: External ear normal.  Nose: Nose normal.  Mouth/Throat: Oropharynx is clear and moist.  Eyes: Conjunctivae and EOM are normal. Pupils are equal, round, and reactive to light.  Neck: Normal range of motion. Neck supple.  Cardiovascular: Normal rate, regular rhythm, normal heart sounds and intact distal pulses.   Pulmonary/Chest: Effort normal and breath sounds normal.  Abdominal: Soft. Bowel sounds are normal.  Genitourinary:  Patient had VRE and colonoscopy September 2016.  Musculoskeletal: Normal range of motion.  Neurological: He is alert and oriented to person, place, and time.  Skin: Skin is warm and dry.  Psychiatric: He has a normal mood and affect. His behavior is normal. Judgment and thought content normal.    Activities of Daily Living In your present state of health, do you have any difficulty performing the following activities: 01/24/2016 07/08/2015  Hearing? N N  Vision? N N  Difficulty concentrating or making decisions? N -  Walking or climbing stairs? Y N  Dressing or bathing? N N  Doing errands, shopping? N -    Fall Risk Assessment Fall Risk  01/24/2016  Falls in the past year? No     Depression Screen PHQ 2/9 Scores 01/24/2016  PHQ - 2 Score 0    Cognitive Testing - 6-CIT    Year: 0 4 points  Month: 0 3 points  Memorize "[place], [place], [address] [place]"  Time (within 1 hour:) 0 3 points  Count backwards from 20: 0 2 4 points  Name months of year: 0 2 4 points  Repeat Address: 0 2 4 6 8 10  points   Total Score: 6/28  Interpretation : Normal (0-7) Abnormal (8-28)    Assessment &  Plan:     Annual Wellness Visit  Reviewed patient's Family Medical History Reviewed and updated list of patient's medical providers Assessment of cognitive impairment was done Assessed patient's functional ability Established a written schedule for health screening [place] Completed and Reviewed  Exercise Activities and Dietary recommendations Goals    None      Immunization History  Administered Date(s) Administered  . Tdap 05/20/2013  . Zoster 05/20/2013    Health Maintenance  Topic Date Due  . FOOT EXAM  01/11/1960  . OPHTHALMOLOGY EXAM  01/11/1960  . URINE MICROALBUMIN  01/11/1960  . PNA vac Low Risk Adult (1 of 2 - PCV13) 01/11/2015  . INFLUENZA VACCINE  05/30/2015  . HEMOGLOBIN A1C  10/11/2015  . COLONOSCOPY  07/07/2018  . TETANUS/TDAP  05/21/2023  . ZOSTAVAX  Completed  . Hepatitis C Screening  Completed      Discussed health benefits of physical activity, and encouraged  him to engage in regular exercise appropriate for his age and condition.  Thrombocytopenia Type 2 diabetes/prediabetes Hyperlipidemia Obesity hypertension Obtain lab follow-up on all these issues. I have done the exam and reviewed the above chart and it is accurate to the best of my knowledge.  Miguel Aschoff MD [facility] 01/24/2016 10:29 AM  ------------------------------------------------------------------------------------------------------------

## 2016-01-25 ENCOUNTER — Telehealth: Payer: Self-pay

## 2016-01-25 LAB — CBC WITH DIFFERENTIAL/PLATELET
BASOS: 0 %
Basophils Absolute: 0 10*3/uL (ref 0.0–0.2)
EOS (ABSOLUTE): 0.1 10*3/uL (ref 0.0–0.4)
Eos: 3 %
HEMATOCRIT: 46.4 % (ref 37.5–51.0)
Hemoglobin: 15.9 g/dL (ref 12.6–17.7)
IMMATURE GRANS (ABS): 0 10*3/uL (ref 0.0–0.1)
Immature Granulocytes: 0 %
LYMPHS ABS: 0.9 10*3/uL (ref 0.7–3.1)
Lymphs: 19 %
MCH: 30.2 pg (ref 26.6–33.0)
MCHC: 34.3 g/dL (ref 31.5–35.7)
MCV: 88 fL (ref 79–97)
MONOS ABS: 0.4 10*3/uL (ref 0.1–0.9)
Monocytes: 8 %
NEUTROS ABS: 3.4 10*3/uL (ref 1.4–7.0)
Neutrophils: 70 %
Platelets: 73 10*3/uL — CL (ref 150–379)
RBC: 5.27 x10E6/uL (ref 4.14–5.80)
RDW: 15.6 % — AB (ref 12.3–15.4)
WBC: 4.9 10*3/uL (ref 3.4–10.8)

## 2016-01-25 LAB — LIPID PANEL WITH LDL/HDL RATIO
Cholesterol, Total: 153 mg/dL (ref 100–199)
HDL: 32 mg/dL — ABNORMAL LOW (ref >39)
LDL CALC: 71 mg/dL (ref 0–99)
LDL/HDL RATIO: 2.2 ratio (ref 0.0–3.6)
Triglycerides: 249 mg/dL — ABNORMAL HIGH (ref 0–149)
VLDL Cholesterol Cal: 50 mg/dL — ABNORMAL HIGH (ref 5–40)

## 2016-01-25 LAB — COMPREHENSIVE METABOLIC PANEL
A/G RATIO: 1.8 (ref 1.2–2.2)
ALT: 32 IU/L (ref 0–44)
AST: 40 IU/L (ref 0–40)
Albumin: 4.4 g/dL (ref 3.6–4.8)
Alkaline Phosphatase: 74 IU/L (ref 39–117)
BUN/Creatinine Ratio: 14 (ref 10–22)
BUN: 15 mg/dL (ref 8–27)
Bilirubin Total: 1.4 mg/dL — ABNORMAL HIGH (ref 0.0–1.2)
CALCIUM: 9.1 mg/dL (ref 8.6–10.2)
CO2: 23 mmol/L (ref 18–29)
Chloride: 101 mmol/L (ref 96–106)
Creatinine, Ser: 1.07 mg/dL (ref 0.76–1.27)
GFR, EST AFRICAN AMERICAN: 83 mL/min/{1.73_m2} (ref >59)
GFR, EST NON AFRICAN AMERICAN: 72 mL/min/{1.73_m2} (ref >59)
GLOBULIN, TOTAL: 2.5 g/dL (ref 1.5–4.5)
Glucose: 119 mg/dL — ABNORMAL HIGH (ref 65–99)
POTASSIUM: 4.3 mmol/L (ref 3.5–5.2)
SODIUM: 140 mmol/L (ref 134–144)
Total Protein: 6.9 g/dL (ref 6.0–8.5)

## 2016-01-25 LAB — TSH: TSH: 2.96 u[IU]/mL (ref 0.450–4.500)

## 2016-01-25 LAB — HEMOGLOBIN A1C
Est. average glucose Bld gHb Est-mCnc: 134 mg/dL
HEMOGLOBIN A1C: 6.3 % — AB (ref 4.8–5.6)

## 2016-01-25 NOTE — Telephone Encounter (Signed)
-----   Message from Jerrol Banana., MD sent at 01/25/2016  1:44 PM EDT ----- Labs all stable. Platelets at 73 which is stable. Return to clinic 3-4 months as discussed.

## 2016-01-25 NOTE — Telephone Encounter (Signed)
Pt advised as directed below.   Thanks,   -Elania Crowl  

## 2016-01-27 ENCOUNTER — Telehealth: Payer: Self-pay | Admitting: *Deleted

## 2016-01-27 NOTE — Telephone Encounter (Signed)
Reports that he just had labs at PCP asking if he will need labs again on 4/17, Per Dr Mike Gip cancel labs

## 2016-02-12 ENCOUNTER — Other Ambulatory Visit: Payer: Self-pay | Admitting: Hematology and Oncology

## 2016-02-13 ENCOUNTER — Encounter: Payer: Self-pay | Admitting: Hematology and Oncology

## 2016-02-13 ENCOUNTER — Inpatient Hospital Stay: Payer: PPO

## 2016-02-13 ENCOUNTER — Other Ambulatory Visit: Payer: PPO

## 2016-02-13 ENCOUNTER — Inpatient Hospital Stay: Payer: PPO | Attending: Internal Medicine | Admitting: Hematology and Oncology

## 2016-02-13 ENCOUNTER — Other Ambulatory Visit: Payer: Self-pay

## 2016-02-13 VITALS — BP 129/78 | HR 88 | Temp 97.3°F | Resp 20 | Wt 241.6 lb

## 2016-02-13 DIAGNOSIS — Z801 Family history of malignant neoplasm of trachea, bronchus and lung: Secondary | ICD-10-CM | POA: Diagnosis not present

## 2016-02-13 DIAGNOSIS — E119 Type 2 diabetes mellitus without complications: Secondary | ICD-10-CM | POA: Insufficient documentation

## 2016-02-13 DIAGNOSIS — Z808 Family history of malignant neoplasm of other organs or systems: Secondary | ICD-10-CM | POA: Insufficient documentation

## 2016-02-13 DIAGNOSIS — E538 Deficiency of other specified B group vitamins: Secondary | ICD-10-CM

## 2016-02-13 DIAGNOSIS — K621 Rectal polyp: Secondary | ICD-10-CM | POA: Diagnosis not present

## 2016-02-13 DIAGNOSIS — D6959 Other secondary thrombocytopenia: Secondary | ICD-10-CM | POA: Diagnosis not present

## 2016-02-13 DIAGNOSIS — K319 Disease of stomach and duodenum, unspecified: Secondary | ICD-10-CM | POA: Diagnosis not present

## 2016-02-13 DIAGNOSIS — Z87440 Personal history of urinary (tract) infections: Secondary | ICD-10-CM | POA: Insufficient documentation

## 2016-02-13 DIAGNOSIS — K7460 Unspecified cirrhosis of liver: Secondary | ICD-10-CM | POA: Diagnosis not present

## 2016-02-13 DIAGNOSIS — Z87442 Personal history of urinary calculi: Secondary | ICD-10-CM | POA: Diagnosis not present

## 2016-02-13 DIAGNOSIS — I8500 Esophageal varices without bleeding: Secondary | ICD-10-CM | POA: Insufficient documentation

## 2016-02-13 DIAGNOSIS — K740 Hepatic fibrosis: Secondary | ICD-10-CM | POA: Diagnosis not present

## 2016-02-13 DIAGNOSIS — Z79899 Other long term (current) drug therapy: Secondary | ICD-10-CM | POA: Diagnosis not present

## 2016-02-13 DIAGNOSIS — R161 Splenomegaly, not elsewhere classified: Secondary | ICD-10-CM | POA: Diagnosis not present

## 2016-02-13 DIAGNOSIS — D696 Thrombocytopenia, unspecified: Secondary | ICD-10-CM

## 2016-02-13 MED ORDER — CYANOCOBALAMIN 1000 MCG/ML IJ SOLN
1000.0000 ug | Freq: Once | INTRAMUSCULAR | Status: AC
  Administered 2016-02-13: 1000 ug via INTRAMUSCULAR
  Filled 2016-02-13: qty 1

## 2016-02-13 NOTE — Progress Notes (Signed)
[place] [facility] day:  02/13/2016  Chief Complaint: Kenneth Brown is a 65 y.o. male with thrombocytopenia secondary to liver disease and B12 deficiency who is seen for 6 month assessment.  HPI: The patient was last seen in the medical oncology clinic on 08/26/2015.  At that time, he was seen after a liver biopsy.  Liver biopsy revealed non-specific mixed hepatic and biliary process with peripheral fibrosis.  Platelet count was 79,000.  Follow-up platelet count was 75,000 on 11/18/2015.  He was seen in consultation at [facility] by Dr. Dorina Hoyer in transplant hepatology on 12/07/2015.  He underwent extensive testing.  The patient states that he is not interested in transplant and does not plan to go back to [facility].  He will be following up with Dr. Allen Norris.  Symptomatically he feels good.  He denies any complaints.  He denies any bruising or bleeding.  Past Medical History  Diagnosis Date  . UTI (urinary tract infection) 10/2014  . Diabetes mellitus without complication ([place])     pre diabetic  . Renal calculus, bilateral   . Difficulty urinating   . Acute cystitis   . Gross hematuria   . Platelets decreased ([place])     Past Surgical History  Procedure Laterality Date  . Ankle surgery  08/28/12  . Orif ankle fracture Right     plate and screws, pt still has  . Ureteroscopy with holmium laser lithotripsy Right 03/21/2015    Procedure: URETEROSCOPY WITH HOLMIUM LASER LITHOTRIPSY,retrograde pyelogram,stent placement;  Surgeon: Collier Flowers, MD;  Location: [facility];  Service: Urology;  Laterality: Right;  . Colonoscopy with propofol N/A 07/08/2015    Procedure: COLONOSCOPY WITH PROPOFOL;  Surgeon: Lucilla Lame, MD;  Location: [place];  Service: Endoscopy;  Laterality: N/A;  . Esophagogastroduodenoscopy (egd) with propofol N/A 07/08/2015    Procedure: ESOPHAGOGASTRODUODENOSCOPY (EGD) WITH PROPOFOL;  Surgeon: Lucilla Lame, MD;  Location: [place];  Service: Endoscopy;  Laterality: N/A;  . Polypectomy  07/08/2015    Procedure: POLYPECTOMY;  Surgeon: Lucilla Lame, MD;  Location: [place];  Service: Endoscopy;;    Family History  Problem Relation Age of Onset  . Hypertension Mother   . Heart disease Mother   . Stroke Mother   . COPD Mother   . Heart disease Father   . Heart attack Father   . Lung cancer Father   . Skin cancer Father     Social History:  reports that he has never smoked. He has never used smokeless tobacco. He reports that he drinks alcohol. He reports that he does not use illicit drugs.  He is alone today.  Allergies: No Known Allergies  Current Medications: Current Outpatient Prescriptions  Medication Sig Dispense Refill  . naproxen (NAPROSYN) 250 MG tablet Take 500 mg by mouth as needed for mild pain or moderate pain. Reported on 02/13/2016     No current facility-administered medications for this visit.   Review of Systems:  GENERAL:  Feels good.  No fevers, sweats or weight loss. PERFORMANCE STATUS (ECOG):  0 HEENT:  No visual changes, sore throat, mouth sores or tenderness. Lungs: No shortness of breath or cough.  No hemoptysis. Cardiac:  No chest pain, palpitations, orthopnea, or PND. GI:  No nausea, vomiting, diarrhea, constipation, melena or hematochezia.  Colonoscopy 2015. GU:  No urgency, frequency, dysuria, or hematuria. Musculoskeletal:  No back pain.  Ankle pain.  No muscle tenderness. Extremities:  No pain or  swelling. Skin:  No rashes or skin changes. Neuro:  No headache, numbness or weakness, balance or coordination issues. Endocrine:  No diabetes, thyroid issues, hot flashes or night sweats. Psych:  No mood changes, depression or anxiety. Pain:  No focal pain. Review of systems:  All other systems reviewed and found to be negative.   Physical Exam: Blood pressure 129/78, pulse 88, temperature 97.3 F (36.3 C), temperature source Tympanic, resp. rate 20, weight  241 lb 10 oz (109.6 kg). GENERAL:  Well developed, well nourished, gentleman sitting comfortably in the exam room in no acute distress. MENTAL STATUS:  Alert and oriented to person, place and time. HEAD:  Pearline Cables hair.  Normocephalic, atraumatic, face symmetric, no Cushingoid features. EYES:  Blue eyes.  Pupils equal round and reactive to light and accomodation.  No conjunctivitis or scleral icterus. ENT:  Oropharynx clear without lesion.  Tongue normal. Mucous membranes moist.  RESPIRATORY:  Clear to auscultation without rales, wheezes or rhonchi. CARDIOVASCULAR:  Regular rate and rhythm without murmur, rub or gallop. ABDOMEN:  Soft, non-tender, with active bowel sounds, and no appreciable hepatomegaly.  Spleen palpable.  No masses. SKIN:  Liver biopsy site unremarkable except for tape irritation.  No rashes, ulcers or lesions. EXTREMITIES: No edema, no skin discoloration or tenderness.  No palpable cords. LYMPH NODES: No palpable cervical, supraclavicular, axillary or inguinal adenopathy  NEUROLOGICAL: Unremarkable. PSYCH:  Appropriate.   No visits with results within 3 Day(s) from this visit. Latest known visit with results is:  Office Visit on 01/24/2016  Component Date Value Ref Range Status  . Cholesterol, Total 01/24/2016 153  100 - 199 mg/dL Final  . Triglycerides 01/24/2016 249* 0 - 149 mg/dL Final  . HDL 01/24/2016 32* >39 mg/dL Final  . VLDL Cholesterol Cal 01/24/2016 50* 5 - 40 mg/dL Final  . LDL Calculated 01/24/2016 71  0 - 99 mg/dL Final  . LDl/HDL Ratio 01/24/2016 2.2  0.0 - 3.6 ratio units Final   Comment:                                     LDL/HDL Ratio                                             Men  Women                               1/2 Avg.Risk  1.0    1.5                                   Avg.Risk  3.6    3.2                                2X Avg.Risk  6.2    5.0                                3X Avg.Risk  8.0    6.1   . TSH 01/24/2016 2.960  0.450 - 4.500  uIU/mL Final  . Hgb A1c MFr  Bld 01/24/2016 6.3* 4.8 - 5.6 % Final   Comment:          Pre-diabetes: 5.7 - 6.4          Diabetes: >6.4          Glycemic control for adults with diabetes: <7.0   . Est. average glucose Bld gHb Est-m* 01/24/2016 134   Final  . WBC 01/24/2016 4.9  3.4 - 10.8 x10E3/uL Final  . RBC 01/24/2016 5.27  4.14 - 5.80 x10E6/uL Final  . Hemoglobin 01/24/2016 15.9  12.6 - 17.7 g/dL Final  . Hematocrit 01/24/2016 46.4  37.5 - 51.0 % Final  . MCV 01/24/2016 88  79 - 97 fL Final  . MCH 01/24/2016 30.2  26.6 - 33.0 pg Final  . MCHC 01/24/2016 34.3  31.5 - 35.7 g/dL Final  . RDW 01/24/2016 15.6* 12.3 - 15.4 % Final  . Platelets 01/24/2016 73* 150 - 379 x10E3/uL Final   Comment: Actual platelet count may be somewhat higher than reported due to aggregation of platelets in this sample.   . Neutrophils 01/24/2016 70   Final  . Lymphs 01/24/2016 19   Final  . Monocytes 01/24/2016 8   Final  . Eos 01/24/2016 3   Final  . Basos 01/24/2016 0   Final  . Neutrophils Absolute 01/24/2016 3.4  1.4 - 7.0 x10E3/uL Final  . Lymphocytes Absolute 01/24/2016 0.9  0.7 - 3.1 x10E3/uL Final  . Monocytes Absolute 01/24/2016 0.4  0.1 - 0.9 x10E3/uL Final  . EOS (ABSOLUTE) 01/24/2016 0.1  0.0 - 0.4 x10E3/uL Final  . Basophils Absolute 01/24/2016 0.0  0.0 - 0.2 x10E3/uL Final  . Immature Granulocytes 01/24/2016 0   Final  . Immature Grans (Abs) 01/24/2016 0.0  0.0 - 0.1 x10E3/uL Final  . Hematology Comments: 01/24/2016 Note:   Final   Verified by microscopic examination.  . Glucose 01/24/2016 119* 65 - 99 mg/dL Final  . BUN 01/24/2016 15  8 - 27 mg/dL Final  . Creatinine, Ser 01/24/2016 1.07  0.76 - 1.27 mg/dL Final  . GFR calc non Af Amer 01/24/2016 72  >59 mL/min/1.73 Final  . GFR calc Af Amer 01/24/2016 83  >59 mL/min/1.73 Final  . BUN/Creatinine Ratio 01/24/2016 14  10 - 22 Final   Comment: **Effective January 30, 2016 BUN/Creatinine Ratio**   reference interval will be changing to:               Age                  Male          Male      0 days   -  7 days          9 - 25         9 - 26      8 days   - 30 days          8 - 42        10 - 33      1 month  -  6 months       11 - 63        11 - 72      7 months -  1 year         11 - 15        20 - 54      2 years  -  5 years  19 - 51        19 - 49      6 years  - 12 years        48 - 65        13 - 37     13 years  - 17 years        23 - 37        10 - 29     18 years  - 7 years         9 - 72         9 - 93                >59 years        10 - 51        12 - 8   . Sodium 01/24/2016 140  134 - 144 mmol/L Final  . Potassium 01/24/2016 4.3  3.5 - 5.2 mmol/L Final  . Chloride 01/24/2016 101  96 - 106 mmol/L Final  . CO2 01/24/2016 23  18 - 29 mmol/L Final  . Calcium 01/24/2016 9.1  8.6 - 10.2 mg/dL Final  . Total Protein 01/24/2016 6.9  6.0 - 8.5 g/dL Final  . Albumin 01/24/2016 4.4  3.6 - 4.8 g/dL Final  . Globulin, Total 01/24/2016 2.5  1.5 - 4.5 g/dL Final  . Albumin/Globulin Ratio 01/24/2016 1.8  1.2 - 2.2 Final                 **Please note reference interval change**  . Bilirubin Total 01/24/2016 1.4* 0.0 - 1.2 mg/dL Final  . Alkaline Phosphatase 01/24/2016 74  39 - 117 IU/L Final  . AST 01/24/2016 40  0 - 40 IU/L Final  . ALT 01/24/2016 32  0 - 44 IU/L Final    Assessment:  Kenneth Brown is a 65 y.o. male with mild thrombocytopenia since 2013 due to underlying liver disease.  Platelet has ranged between 59,000 and 74,000 without trend.  Abdominal and pelvic CT scan on 01/14/2015 revealed a nodular liver and moderate splenomegaly (18 cm).  He denied any new medications or herbal products.  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  He also had dverticulosis and a 7 mm polyp in the rectum.   Pathology from his gastric biopsy revealed reactive gastropathy. There was a tubular adenoma in the rectum.  Work-up on 05/06/2015 revealed B12 deficiency.  CBC revealed a hematocrit of 48.3, hemoglobin 16.1, platelets 67,000, WBC 5500 with an ANC of 3900.  Differential was unremarkable.  B12 was 142 (low).  Folate was 11.1 (normal).  Hepatitis B core antibody total, hepatitis B surface antigen, hepatitis C antibody, and HIV testing was negative.  SPEP, ANA, lupus anticoagulant, TSH, and LDH were normal.  Negative studies on 06/28/2015. Included an AFP (2.2), mitochondrial M2 antibody, F-Acton IgG, immunoglobulins, ceruloplasmin, ferritin (39), and iron studies. Bilirubin was 1.7, AST 49, ALT 32, and alkaline phosphatase 73.  Abdominal ultrasound on 07/13/2015 revealed coarse hepatic echotexture was slightly nodular hepatic borders consistent with cirrhosis.  Hepatic elastography on 07/14/2015 revealed a median shear wave velocity of 1.2 m/sec corresponding to a Metavir fibrosis score of F2 + some F3.   Risk of fibrosis was moderate.   Liver biopsy on 08/19/2015 revealed non-specific mixed hepatic and  biliary process with peripheral fibrosis.  Given the negative findings, diagnostic considerations included an adverse drug/herbal reaction, vascular disorders, autoimmune disease, viral hepatitis, and partial bile duct obstruction.  He was evaluated by the liver transplant team at [facility] on 12/07/2015 and is not interested in pursuing further at this time.   He received B12 weekly x 6 (07/13 - 06/17/2015).  He receives monthly B12 (last 01/16/2016).  Symptomatically, he feels well.  Exam reveals a palpable spleen.  Platelet count was stable (73,000) on 01/24/2016.  Plan: 1.  Review labs from 01/24/2016. 2.  B12 today and monthly. 3.  Discuss close follow-up with Dr Allen Norris. 4.  RTC in 2 months for CBC with diff. 5.  RTC in 4 months for MD assessment, labs (CBC with diff), and B12.   Lequita Asal,  MD  02/13/2016, 10:30 AM

## 2016-02-13 NOTE — Progress Notes (Signed)
Here for F/U thrombocytopenia. Feels well. Appetite is good. Energy is good. Normal BM's no blood in stools. Voiding normally.Denies any nosebleeds or blood anywhere. Saw Dr Allen Norris 3-4 months ago. Has another appt coming up.

## 2016-02-24 ENCOUNTER — Ambulatory Visit: Payer: PPO

## 2016-02-24 ENCOUNTER — Other Ambulatory Visit: Payer: PPO

## 2016-02-24 ENCOUNTER — Ambulatory Visit: Payer: PPO | Admitting: Hematology and Oncology

## 2016-02-27 ENCOUNTER — Other Ambulatory Visit: Payer: PPO

## 2016-02-27 ENCOUNTER — Ambulatory Visit: Payer: PPO | Admitting: Hematology and Oncology

## 2016-02-27 ENCOUNTER — Ambulatory Visit: Payer: PPO

## 2016-03-14 ENCOUNTER — Ambulatory Visit: Payer: PPO

## 2016-03-19 ENCOUNTER — Inpatient Hospital Stay: Payer: PPO | Attending: Internal Medicine

## 2016-03-19 DIAGNOSIS — E538 Deficiency of other specified B group vitamins: Secondary | ICD-10-CM

## 2016-03-19 DIAGNOSIS — Z79899 Other long term (current) drug therapy: Secondary | ICD-10-CM | POA: Diagnosis not present

## 2016-03-19 MED ORDER — CYANOCOBALAMIN 1000 MCG/ML IJ SOLN
1000.0000 ug | Freq: Once | INTRAMUSCULAR | Status: AC
  Administered 2016-03-19: 1000 ug via INTRAMUSCULAR
  Filled 2016-03-19: qty 1

## 2016-04-12 ENCOUNTER — Inpatient Hospital Stay: Payer: PPO

## 2016-04-12 ENCOUNTER — Inpatient Hospital Stay: Payer: PPO | Attending: Hematology and Oncology

## 2016-04-12 DIAGNOSIS — Z79899 Other long term (current) drug therapy: Secondary | ICD-10-CM | POA: Insufficient documentation

## 2016-04-12 DIAGNOSIS — E538 Deficiency of other specified B group vitamins: Secondary | ICD-10-CM | POA: Insufficient documentation

## 2016-04-12 LAB — CBC WITH DIFFERENTIAL/PLATELET
Basophils Absolute: 0 10*3/uL (ref 0–0.1)
Basophils Relative: 1 %
Eosinophils Absolute: 0.1 10*3/uL (ref 0–0.7)
Eosinophils Relative: 3 %
HCT: 46.1 % (ref 40.0–52.0)
Hemoglobin: 16 g/dL (ref 13.0–18.0)
Lymphocytes Relative: 15 %
Lymphs Abs: 0.8 10*3/uL — ABNORMAL LOW (ref 1.0–3.6)
MCH: 30.1 pg (ref 26.0–34.0)
MCHC: 34.7 g/dL (ref 32.0–36.0)
MCV: 86.8 fL (ref 80.0–100.0)
Monocytes Absolute: 0.4 10*3/uL (ref 0.2–1.0)
Monocytes Relative: 8 %
Neutro Abs: 3.9 10*3/uL (ref 1.4–6.5)
Neutrophils Relative %: 73 %
Platelets: 70 10*3/uL — ABNORMAL LOW (ref 150–440)
RBC: 5.31 MIL/uL (ref 4.40–5.90)
RDW: 15 % — ABNORMAL HIGH (ref 11.5–14.5)
WBC: 5.3 10*3/uL (ref 3.8–10.6)

## 2016-04-12 MED ORDER — CYANOCOBALAMIN 1000 MCG/ML IJ SOLN
1000.0000 ug | Freq: Once | INTRAMUSCULAR | Status: AC
  Administered 2016-04-12: 1000 ug via INTRAMUSCULAR
  Filled 2016-04-12: qty 1

## 2016-04-16 ENCOUNTER — Ambulatory Visit: Payer: PPO

## 2016-04-16 ENCOUNTER — Other Ambulatory Visit: Payer: PPO

## 2016-05-07 ENCOUNTER — Ambulatory Visit (INDEPENDENT_AMBULATORY_CARE_PROVIDER_SITE_OTHER): Payer: PPO | Admitting: Family Medicine

## 2016-05-07 VITALS — BP 132/78 | HR 80 | Temp 97.8°F | Resp 16 | Wt 245.0 lb

## 2016-05-07 DIAGNOSIS — D649 Anemia, unspecified: Secondary | ICD-10-CM

## 2016-05-07 DIAGNOSIS — E785 Hyperlipidemia, unspecified: Secondary | ICD-10-CM

## 2016-05-07 DIAGNOSIS — E1121 Type 2 diabetes mellitus with diabetic nephropathy: Secondary | ICD-10-CM | POA: Diagnosis not present

## 2016-05-07 DIAGNOSIS — R7303 Prediabetes: Secondary | ICD-10-CM | POA: Diagnosis not present

## 2016-05-07 DIAGNOSIS — K429 Umbilical hernia without obstruction or gangrene: Secondary | ICD-10-CM

## 2016-05-07 DIAGNOSIS — D696 Thrombocytopenia, unspecified: Secondary | ICD-10-CM

## 2016-05-07 LAB — POCT UA - MICROALBUMIN: MICROALBUMIN (UR) POC: 50 mg/L

## 2016-05-07 LAB — POCT GLYCOSYLATED HEMOGLOBIN (HGB A1C): HEMOGLOBIN A1C: 6.1

## 2016-05-07 MED ORDER — LOSARTAN POTASSIUM 25 MG PO TABS: 25.0000 mg | ORAL_TABLET | Freq: Every day | ORAL | Status: DC

## 2016-05-07 NOTE — Progress Notes (Signed)
Subjective:  HPI  Patient is here for follow up. Last office visit was in March for CPE, he had routine labs done. He saw oncologist for follow up and had CBC done in June.   Overall he feels pretty well. Hyperglycemia: Patient checks his sugar at times and readings for fasting usually around 118, this morning was 130. No hypoglycemic episodes. He is not on any medications for this. Lab Results  Component Value Date   HGBA1C 6.3* 01/24/2016     Prior to Admission medications   Medication Sig Start Date End Date Taking? Authorizing Provider  naproxen (NAPROSYN) 250 MG tablet Take 500 mg by mouth as needed for mild pain or moderate pain. Reported on 02/13/2016    Historical Provider, MD    Patient Active Problem List   Diagnosis Date Noted  . B12 deficiency 05/07/2015  . Splenomegaly 05/06/2015  . Thrombocytopenia ([place]) 04/17/2015  . Abnormal liver enzymes 03/02/2015  . Allergic rhinitis 03/02/2015  . Blood pressure elevated 03/02/2015  . Borderline diabetes 03/02/2015  . History of colon polyps 03/02/2015  . Personal history of traumatic fracture 03/02/2015  . H/O disease 03/02/2015  . H/O renal calculi 03/02/2015  . Personal history of infectious and parasitic disease 03/02/2015  . HLD (hyperlipidemia) 03/02/2015  . Diabetes mellitus, type 2 ([place]) 03/02/2015  . Adiposity 03/02/2015  . Arthritis 10/12/2014    Past Medical History  Diagnosis Date  . UTI (urinary tract infection) 10/2014  . Diabetes mellitus without complication ([place])     pre diabetic  . Renal calculus, bilateral   . Difficulty urinating   . Acute cystitis   . Gross hematuria   . Platelets decreased ([place])     Social History   Social History  . Marital Status: Married    Spouse Name: N/A  . Number of Children: N/A  . Years of Education: N/A   Occupational History  . Not on file.   Social History Main Topics  . Smoking status: Never Smoker   . Smokeless tobacco: Never Used  . Alcohol Use:  0.0 - 0.6 oz/week    0-1 Cans of beer per week  . Drug Use: No  . Sexual Activity: Not on file   Other Topics Concern  . Not on file   Social History Narrative    No Known Allergies  Review of Systems  Constitutional: Negative.   HENT: Negative.   Eyes: Negative.   Respiratory: Negative.   Cardiovascular: Negative.   Gastrointestinal: Negative.   Genitourinary: Negative.   Musculoskeletal: Positive for joint pain.  Skin: Negative.   Neurological: Negative.     Immunization History  Administered Date(s) Administered  . Pneumococcal Conjugate-13 01/24/2016  . Tdap 05/20/2013  . Zoster 05/20/2013   Objective:  BP 132/78 mmHg  Pulse 80  Temp(Src) 97.8 F (36.6 C)  Resp 16  Wt 245 lb (111.131 kg)  Physical Exam  Constitutional: He is oriented to person, place, and time and well-developed, well-nourished, and in no distress.  HENT:  Head: Normocephalic and atraumatic.  Right Ear: External ear normal.  Left Ear: External ear normal.  Eyes: Conjunctivae are normal. Pupils are equal, round, and reactive to light.  Neck: Normal range of motion. Neck supple.  Cardiovascular: Normal rate, regular rhythm, normal heart sounds and intact distal pulses.   No murmur heard. Pulmonary/Chest: Effort normal and breath sounds normal. No respiratory distress. He has no wheezes.  Abdominal: A hernia is present. Hernia confirmed positive in the umbilical  area.  Musculoskeletal: He exhibits no edema or tenderness.  Neurological: He is alert and oriented to person, place, and time. Gait normal.  Skin: Skin is warm and dry.  Psychiatric: Mood, memory, affect and judgment normal.   Diabetic Foot Exam - Simple   Simple Foot Form  Diabetic Foot exam was performed with the following findings:  Yes 05/07/2016  9:22 AM  Visual Inspection  No deformities, no ulcerations, no other skin breakdown bilaterally:  Yes  Sensation Testing  Intact to touch and monofilament testing bilaterally:  Yes   Pulse Check  Posterior Tibialis and Dorsalis pulse intact bilaterally:  Yes  Comments       Lab Results  Component Value Date   WBC 5.3 04/12/2016   HGB 16.0 04/12/2016   HCT 46.1 04/12/2016   PLT 70* 04/12/2016   GLUCOSE 119* 01/24/2016   CHOL 153 01/24/2016   TRIG 249* 01/24/2016   HDL 32* 01/24/2016   LDLCALC 71 01/24/2016   TSH 2.960 01/24/2016   PSA 1.1 11/03/2014   INR 1.17 08/19/2015   HGBA1C 6.3* 01/24/2016    CMP     Component Value Date/Time   NA 140 01/24/2016 1151   NA 139 03/08/2015 1414   NA 138 08/30/2012 0342   K 4.3 01/24/2016 1151   K 4.1 08/30/2012 0342   CL 101 01/24/2016 1151   CL 105 08/30/2012 0342   CO2 23 01/24/2016 1151   CO2 26 08/30/2012 0342   GLUCOSE 119* 01/24/2016 1151   GLUCOSE 95 03/08/2015 1414   GLUCOSE 131* 08/30/2012 0342   BUN 15 01/24/2016 1151   BUN 16 03/08/2015 1414   BUN 13 08/30/2012 0342   CREATININE 1.07 01/24/2016 1151   CREATININE 1.1 11/03/2014   CREATININE 1.26 08/30/2012 0342   CALCIUM 9.1 01/24/2016 1151   CALCIUM 8.2* 08/30/2012 0342   PROT 6.9 01/24/2016 1151   PROT 7.0 06/28/2015 1614   PROT 7.8 08/28/2012 2207   ALBUMIN 4.4 01/24/2016 1151   ALBUMIN 4.4 06/28/2015 1614   ALBUMIN 4.1 08/28/2012 2207   AST 40 01/24/2016 1151   AST 49* 08/28/2012 2207   ALT 32 01/24/2016 1151   ALT 50 08/28/2012 2207   ALKPHOS 74 01/24/2016 1151   ALKPHOS 88 08/28/2012 2207   BILITOT 1.4* 01/24/2016 1151   BILITOT 1.7* 06/28/2015 1614   BILITOT 0.9 08/28/2012 2207   GFRNONAA 72 01/24/2016 1151   GFRNONAA >60 08/30/2012 0342   GFRAA 83 01/24/2016 1151   GFRAA >60 08/30/2012 0342    Assessment and Plan :  1. Pre-diabetes A1C 6.1 better. Continue working on habits. He has started to spill some protein in his urine so will start Losartan to protect kidneys. - POCT HgB A1C - POCT UA - Microalbumin - losartan (COZAAR) 25 MG tablet; Take 1 tablet (25 mg total) by mouth daily.  Dispense: 30 tablet; Refill:  12  2. Dyslipidemia Stable on last check.  3. Anemia, unspecified anemia type Stable.  4. Thrombocytopenia ([place]) Stable, follows oncologist.  5. Umbilical hernia without obstruction and without gangrene Advised patient to just watch this for now. Advised patient weight loss would help. Would not do an elective surgery at this time.  6. Diabetic nephropathy associated with type 2 diabetes mellitus ([place]) Start Losartan. Advised patient to increase fluids and if becomes dizzy or have any other issues to let us know, I do not think on this low of a dose that he will have any issues. 7. Obesity Patient was  seen and examined by Dr. Eulas Post and note was scribed by Theressa Millard, RMA.    Miguel Aschoff MD [facility] 05/07/2016 9:00 AM

## 2016-05-14 ENCOUNTER — Inpatient Hospital Stay: Payer: PPO | Attending: Hematology and Oncology

## 2016-05-14 DIAGNOSIS — Z79899 Other long term (current) drug therapy: Secondary | ICD-10-CM | POA: Diagnosis not present

## 2016-05-14 DIAGNOSIS — E538 Deficiency of other specified B group vitamins: Secondary | ICD-10-CM | POA: Diagnosis not present

## 2016-05-14 MED ORDER — CYANOCOBALAMIN 1000 MCG/ML IJ SOLN
1000.0000 ug | Freq: Once | INTRAMUSCULAR | Status: AC
  Administered 2016-05-14: 1000 ug via INTRAMUSCULAR
  Filled 2016-05-14: qty 1

## 2016-05-17 ENCOUNTER — Telehealth: Payer: Self-pay

## 2016-05-17 NOTE — Telephone Encounter (Signed)
received fax from call a nurse stating patient called and states since starting Losartan on 05/07/16 he has noticed foot odor. No other symptoms per fax, is this a possibility?-aa

## 2016-05-18 NOTE — Telephone Encounter (Signed)
Spoke with wife and she said his feet have a horrible odor still, a little better today. There is no rash, no ulcer.-aa

## 2016-05-18 NOTE — Telephone Encounter (Signed)
Unlikely--will need appt if foot evaluation needed.

## 2016-05-18 NOTE — Telephone Encounter (Signed)
Spoke with wife for a while discussing what Losartan medication was for and why we added it. -aa

## 2016-05-22 ENCOUNTER — Other Ambulatory Visit: Payer: Self-pay

## 2016-05-22 ENCOUNTER — Telehealth: Payer: Self-pay

## 2016-05-22 DIAGNOSIS — K7460 Unspecified cirrhosis of liver: Secondary | ICD-10-CM

## 2016-05-22 NOTE — Telephone Encounter (Signed)
Pt scheduled for MRI Abdomen w/wo contrast at [facility] on Tuesday, August 8th. Pt has been notified of date, time, location and prep instructions.

## 2016-06-05 ENCOUNTER — Ambulatory Visit: Payer: PPO

## 2016-06-13 ENCOUNTER — Other Ambulatory Visit: Payer: Self-pay | Admitting: Hematology and Oncology

## 2016-06-13 ENCOUNTER — Telehealth: Payer: Self-pay | Admitting: *Deleted

## 2016-06-13 NOTE — Telephone Encounter (Signed)
Asking if we can check his urine when he comes in tomorrow. He is having chills, flank pain and dies not feel well in general. I advised that she contact his PCP as this is not something we treat him for. SHe said she understands and will call PCP

## 2016-06-13 NOTE — Progress Notes (Signed)
[facility] day:  06/14/16  Chief Complaint: Kenneth Brown is a 65 y.o. 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  HPI: The patient was last seen in the medical oncology clinic on 02/13/2016.  At that time, he felt well.  Exam revealed a palpable spleen.  Platelet count was stable (73,000) on 01/24/2016.  He received B12.  He has continued to receive B12 monthly (last 05/14/2016).  CBC on 04/12/2016 revealed a hematocrit of 46.1, hemoglobin 16.0, MCV 86.8, platelets 70,000, WBC 5300 with an ANC of 3900.  He is scheduled for a liver MRI tomorrow follow-up with Dr.Wohl.   Symptomatically, he was recently diagnosed with a urinary tract infection. He presented with back and lower abdominal pain. Ciprofloxacin was called in.  He denies any bruising or bleeding.   Past Medical History:  Diagnosis Date  . Acute cystitis   . Diabetes mellitus without complication ([place])    pre diabetic  . Difficulty urinating   . Gross hematuria   . Platelets decreased ([place])   . Renal calculus, bilateral   . UTI (urinary tract infection) 10/2014    Past Surgical History:  Procedure Laterality Date  . ANKLE SURGERY  08/28/12  . COLONOSCOPY WITH PROPOFOL N/A 07/08/2015   Procedure: COLONOSCOPY WITH PROPOFOL;  Surgeon: Lucilla Lame, MD;  Location: [facility];  Service: Endoscopy;  Laterality: N/A;  . ESOPHAGOGASTRODUODENOSCOPY (EGD) WITH PROPOFOL N/A 07/08/2015   Procedure: ESOPHAGOGASTRODUODENOSCOPY (EGD) WITH PROPOFOL;  Surgeon: Lucilla Lame, MD;  Location: [facility];  Service: Endoscopy;  Laterality: N/A;  . ORIF ANKLE FRACTURE Right    plate and screws, pt still has  . POLYPECTOMY  07/08/2015   Procedure: POLYPECTOMY;  Surgeon: Lucilla Lame, MD;  Location: [facility];  Service: Endoscopy;;  . URETEROSCOPY WITH HOLMIUM LASER LITHOTRIPSY Right 03/21/2015   Procedure:  URETEROSCOPY WITH HOLMIUM LASER LITHOTRIPSY,retrograde pyelogram,stent placement;  Surgeon: Collier Flowers, MD;  Location: [facility];  Service: Urology;  Laterality: Right;    Family History  Problem Relation Age of Onset  . Hypertension Mother   . Heart disease Mother   . Stroke Mother   . COPD Mother   . Heart disease Father   . Heart attack Father   . Lung cancer Father   . Skin cancer Father     Social History:  reports that he has never smoked. He has never used smokeless tobacco. He reports that he drinks alcohol. He reports that he does not use drugs.  He is Accompanied by his wife and his 6 month old grandchild, Kenneth Brown, today.  Allergies: No Known Allergies  Current Medications: Current Outpatient Prescriptions  Medication Sig Dispense Refill  . acetaminophen (TYLENOL) 500 MG tablet Take by mouth.    . losartan (COZAAR) 25 MG tablet Take 1 tablet (25 mg total) by mouth daily. 30 tablet 12  . naproxen (NAPROSYN) 250 MG tablet Take 500 mg by mouth as needed for mild pain or moderate pain. Reported on 02/13/2016    . ciprofloxacin (CIPRO) 500 MG tablet Take 1 tablet (500 mg total) by mouth 2 (two) times daily. (Patient not taking: Reported on 06/14/2016) 14 tablet 0   No current facility-administered medications for this visit.    Review of Systems:  GENERAL:  Feels fine.  No fevers, sweats or weight loss. PERFORMANCE STATUS (ECOG):  0 HEENT:  No visual changes, sore throat, mouth  sores or tenderness. Lungs: No shortness of breath or cough.  No hemoptysis. Cardiac:  No chest pain, palpitations, orthopnea, or PND. GI:  No nausea, vomiting, diarrhea, constipation, melena or hematochezia.  Colonoscopy 2015. GU:  Recent UTI.  No urgency, frequency, dysuria, or hematuria. Musculoskeletal:  No back pain.  Ankle pain.  No muscle tenderness. Extremities:  No pain or swelling. Skin:  No rashes or skin changes. Neuro:  No headache, numbness or weakness, balance or coordination  issues. Endocrine:  No diabetes, thyroid issues, hot flashes or night sweats. Psych:  No mood changes, depression or anxiety. Pain:  No focal pain. Review of systems:  All other systems reviewed and found to be negative.   Physical Exam: Blood pressure 128/77, pulse 82, temperature 98.5 F (36.9 C), temperature source Tympanic, resp. rate 18, weight 241 lb 10 oz (109.6 kg). GENERAL:  Well developed, well nourished, gentleman sitting comfortably in the exam room in no acute distress. MENTAL STATUS:  Alert and oriented to person, place and time. HEAD:  Pearline Cables hair.  Normocephalic, atraumatic, face symmetric, no Cushingoid features. EYES:  Blue eyes.  Pupils equal round and reactive to light and accomodation.  No conjunctivitis or scleral icterus. ENT:  Oropharynx clear without lesion.  Tongue normal. Mucous membranes moist.  RESPIRATORY:  Clear to auscultation without rales, wheezes or rhonchi. CARDIOVASCULAR:  Regular rate and rhythm without murmur, rub or gallop. ABDOMEN:  Protuberant abdomen.  Umbilical hernia.  Soft, non-tender, with active bowel sounds, and no appreciable hepatomegaly.  Spleen palpable.   SKIN:  Tan.  No rashes, ulcers or lesions. EXTREMITIES: No edema, no skin discoloration or tenderness.  No palpable cords. LYMPH NODES: No palpable cervical, supraclavicular, axillary or inguinal adenopathy  NEUROLOGICAL: Unremarkable. PSYCH:  Appropriate.    Office Visit on 06/14/2016  Component Date Value Ref Range Status  . Color, UA 06/14/2016 amber   Final  . Clarity, UA 06/14/2016 clear   Final  . Glucose, UA 06/14/2016 negative   Final  . Bilirubin, UA 06/14/2016 negative   Final  . Ketones, UA 06/14/2016 negative   Final  . Spec Grav, UA 06/14/2016 1.010   Final  . Blood, UA 06/14/2016 non hemolyzed moderate   Final  . pH, UA 06/14/2016 6.0   Final  . Protein, UA 06/14/2016 30   Final  . Urobilinogen, UA 06/14/2016 1.0   Final  . Nitrite, UA 06/14/2016 negative   Final   . Leukocytes, UA 06/14/2016 Negative  Negative Final    Assessment:  Kenneth Brown is a 65 y.o. male with mild thrombocytopenia since 2013 due to underlying liver disease.  Platelet count has ranged between 59,000 and 74,000 without trend.  Abdominal and pelvic CT scan on 01/14/2015 revealed a nodular liver and moderate splenomegaly (18 cm).  He denied any new medications or herbal products.  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  He also had dverticulosis and a 7 mm polyp in the rectum.  Pathology from his gastric biopsy revealed reactive gastropathy. There was a tubular adenoma in the rectum.  Work-up on 05/06/2015 revealed B12 deficiency.  CBC revealed a hematocrit of 48.3, hemoglobin 16.1, platelets 67,000, WBC 5500 with an ANC of 3900.  Differential was unremarkable.  B12 was 142 (low).  Folate was 11.1 (normal).  Hepatitis B core  antibody total, hepatitis B surface antigen, hepatitis C antibody, and HIV testing was negative.  SPEP, ANA, lupus anticoagulant, TSH, and LDH were normal.  Negative studies on 06/28/2015. Included an AFP (2.2), mitochondrial M2 antibody, F-Acton IgG, immunoglobulins, ceruloplasmin, ferritin (39), and iron studies. Bilirubin was 1.7, AST 49, ALT 32, and alkaline phosphatase 73.  Abdominal ultrasound on 07/13/2015 revealed coarse hepatic echotexture was slightly nodular hepatic borders consistent with cirrhosis.  Hepatic elastography on 07/14/2015 revealed a median shear wave velocity of 1.2 m/sec corresponding to a Metavir fibrosis score of F2 + some F3.   Risk of fibrosis was moderate.   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  Given the negative findings, diagnostic considerations  included an adverse drug/herbal reaction, vascular disorders, autoimmune disease, viral hepatitis, and partial bile duct obstruction.  He was evaluated by the liver [facility] team at [facility] on 12/07/2015 and is not interested in pursuing further at this time.   He receives B12 monthly B12 (last 05/14/2016).  Symptomatically, he feels well.  He had an interval UTI.  Exam reveals a palpable spleen.  Platelet count is stable (63,000).  Plan: 1.  Labs today: CBC with diff. 2.  B12 today and monthly x 5. 3.  Follow-up liver MRI. 4.  Continue AFP check every 6 months with liver imaging. 5.  RTC in 3 months for CBC with diff. 6.  RTC in 6 months for MD assessment, labs (CBC with diff, AFP), and B12.   Lequita Asal, MD  06/14/2016, 10:34 AM

## 2016-06-14 ENCOUNTER — Inpatient Hospital Stay (HOSPITAL_BASED_OUTPATIENT_CLINIC_OR_DEPARTMENT_OTHER): Payer: PPO | Admitting: Hematology and Oncology

## 2016-06-14 ENCOUNTER — Encounter: Payer: Self-pay | Admitting: Family Medicine

## 2016-06-14 ENCOUNTER — Ambulatory Visit (INDEPENDENT_AMBULATORY_CARE_PROVIDER_SITE_OTHER): Payer: PPO | Admitting: Family Medicine

## 2016-06-14 ENCOUNTER — Inpatient Hospital Stay: Payer: PPO | Attending: Hematology and Oncology

## 2016-06-14 ENCOUNTER — Inpatient Hospital Stay: Payer: PPO

## 2016-06-14 VITALS — BP 128/77 | HR 82 | Temp 98.5°F | Resp 18 | Wt 241.6 lb

## 2016-06-14 VITALS — BP 118/70 | HR 72 | Temp 99.8°F | Resp 17 | Wt 243.8 lb

## 2016-06-14 DIAGNOSIS — D696 Thrombocytopenia, unspecified: Secondary | ICD-10-CM

## 2016-06-14 DIAGNOSIS — K769 Liver disease, unspecified: Secondary | ICD-10-CM | POA: Diagnosis not present

## 2016-06-14 DIAGNOSIS — K319 Disease of stomach and duodenum, unspecified: Secondary | ICD-10-CM | POA: Diagnosis not present

## 2016-06-14 DIAGNOSIS — Z801 Family history of malignant neoplasm of trachea, bronchus and lung: Secondary | ICD-10-CM | POA: Diagnosis not present

## 2016-06-14 DIAGNOSIS — M545 Low back pain, unspecified: Secondary | ICD-10-CM

## 2016-06-14 DIAGNOSIS — E119 Type 2 diabetes mellitus without complications: Secondary | ICD-10-CM

## 2016-06-14 DIAGNOSIS — R161 Splenomegaly, not elsewhere classified: Secondary | ICD-10-CM | POA: Diagnosis not present

## 2016-06-14 DIAGNOSIS — Z87440 Personal history of urinary (tract) infections: Secondary | ICD-10-CM

## 2016-06-14 DIAGNOSIS — Z808 Family history of malignant neoplasm of other organs or systems: Secondary | ICD-10-CM | POA: Insufficient documentation

## 2016-06-14 DIAGNOSIS — Z87442 Personal history of urinary calculi: Secondary | ICD-10-CM | POA: Insufficient documentation

## 2016-06-14 DIAGNOSIS — K621 Rectal polyp: Secondary | ICD-10-CM | POA: Diagnosis not present

## 2016-06-14 DIAGNOSIS — N390 Urinary tract infection, site not specified: Secondary | ICD-10-CM

## 2016-06-14 DIAGNOSIS — R310 Gross hematuria: Secondary | ICD-10-CM

## 2016-06-14 DIAGNOSIS — E538 Deficiency of other specified B group vitamins: Secondary | ICD-10-CM | POA: Diagnosis not present

## 2016-06-14 DIAGNOSIS — K7460 Unspecified cirrhosis of liver: Secondary | ICD-10-CM | POA: Diagnosis not present

## 2016-06-14 DIAGNOSIS — I8500 Esophageal varices without bleeding: Secondary | ICD-10-CM

## 2016-06-14 DIAGNOSIS — Z79899 Other long term (current) drug therapy: Secondary | ICD-10-CM | POA: Insufficient documentation

## 2016-06-14 DIAGNOSIS — R319 Hematuria, unspecified: Secondary | ICD-10-CM | POA: Diagnosis not present

## 2016-06-14 LAB — POCT URINALYSIS DIPSTICK
BILIRUBIN UA: NEGATIVE
Glucose, UA: NEGATIVE
KETONES UA: NEGATIVE
Leukocytes, UA: NEGATIVE
Nitrite, UA: NEGATIVE
PH UA: 6
Protein, UA: 30
Spec Grav, UA: 1.01
Urobilinogen, UA: 1

## 2016-06-14 LAB — CBC WITH DIFFERENTIAL/PLATELET
Basophils Absolute: 0 10*3/uL (ref 0–0.1)
Basophils Relative: 0 %
Eosinophils Absolute: 0 10*3/uL (ref 0–0.7)
Eosinophils Relative: 0 %
HCT: 45.6 % (ref 40.0–52.0)
Hemoglobin: 16 g/dL (ref 13.0–18.0)
Lymphocytes Relative: 8 %
Lymphs Abs: 0.5 10*3/uL — ABNORMAL LOW (ref 1.0–3.6)
MCH: 30.4 pg (ref 26.0–34.0)
MCHC: 35.1 g/dL (ref 32.0–36.0)
MCV: 86.6 fL (ref 80.0–100.0)
Monocytes Absolute: 0.6 10*3/uL (ref 0.2–1.0)
Monocytes Relative: 9 %
Neutro Abs: 5.2 10*3/uL (ref 1.4–6.5)
Neutrophils Relative %: 83 %
Platelets: 63 10*3/uL — ABNORMAL LOW (ref 150–440)
RBC: 5.26 MIL/uL (ref 4.40–5.90)
RDW: 15.7 % — ABNORMAL HIGH (ref 11.5–14.5)
WBC: 6.3 10*3/uL (ref 3.8–10.6)

## 2016-06-14 MED ORDER — CYANOCOBALAMIN 1000 MCG/ML IJ SOLN
1000.0000 ug | Freq: Once | INTRAMUSCULAR | Status: AC
  Administered 2016-06-14: 1000 ug via INTRAMUSCULAR
  Filled 2016-06-14: qty 1

## 2016-06-14 MED ORDER — CIPROFLOXACIN HCL 500 MG PO TABS: 500.0000 mg | ORAL_TABLET | Freq: Two times a day (BID) | ORAL | 0 refills | Status: DC

## 2016-06-14 NOTE — Patient Instructions (Signed)
We will call you with the culture results 

## 2016-06-14 NOTE — Progress Notes (Signed)
Subjective:     Patient ID: Kenneth Brown, male   DOB: [birth year], 65 y.o.   MRN: [id number]  HPI  Chief Complaint  Patient presents with  . Back Pain    Patient comes in office today with complaints of lower back pain for the past 3 days. Patient has had the following symptoms: dark urine, fever high of 102.9, loss of appetite and body chills. Patient reports history of kidney stones, but denies seeing any blood in urine or having any pelvic pain or pressure.   States it feels similar to prior infections he has had. Prior urologist, Dr. Elnoria Howard. Accompanied by his wife and grandaughter.   Review of Systems     Objective:   Physical Exam  Constitutional: He appears well-developed and well-nourished. No distress.  Pulmonary/Chest: Breath sounds normal.  Abdominal: Soft. There is no tenderness.  Genitourinary:  Genitourinary Comments: No cva tenderness       Assessment:    1. Bilateral low back pain without sciatica - Urine culture - POCT urinalysis dipstick  2. Urinary tract infection with hematuria, site unspecified - ciprofloxacin (CIPRO) 500 MG tablet; Take 1 tablet (500 mg total) by mouth 2 (two) times daily.  Dispense: 14 tablet; Refill: 0    Plan:    Further f/u pending urine culture.

## 2016-06-14 NOTE — Progress Notes (Signed)
Patient is here today for a follow up, Just saw Dr Jaynie Crumble at [facility], he was given Cipro for UTI they are doing cultures

## 2016-06-15 ENCOUNTER — Other Ambulatory Visit: Payer: Self-pay | Admitting: *Deleted

## 2016-06-15 ENCOUNTER — Encounter
Admission: RE | Admit: 2016-06-15 | Discharge: 2016-06-15 | Disposition: A | Discharge status: Home / Self Care | Payer: PPO | Source: Ambulatory Visit | Attending: Gastroenterology | Admitting: Gastroenterology

## 2016-06-15 DIAGNOSIS — K7460 Unspecified cirrhosis of liver: Secondary | ICD-10-CM | POA: Insufficient documentation

## 2016-06-15 DIAGNOSIS — R161 Splenomegaly, not elsewhere classified: Secondary | ICD-10-CM | POA: Diagnosis not present

## 2016-06-15 DIAGNOSIS — D696 Thrombocytopenia, unspecified: Secondary | ICD-10-CM

## 2016-06-15 LAB — URINE CULTURE: Organism ID, Bacteria: NO GROWTH

## 2016-06-15 LAB — POCT I-STAT CREATININE: Creatinine, Ser: 1.2 mg/dL (ref 0.61–1.24)

## 2016-06-15 MED ORDER — GADOXETATE DISODIUM 0.25 MMOL/ML IV SOLN
10.0000 mL | Freq: Once | INTRAVENOUS | Status: AC | PRN
  Administered 2016-06-15: 10 mL via INTRAVENOUS

## 2016-06-19 ENCOUNTER — Telehealth: Payer: Self-pay

## 2016-06-19 NOTE — Telephone Encounter (Signed)
-----   Message from Lucilla Lame, MD sent at 06/17/2016 10:58 AM EDT ----- Let the patient know that his MRI of the liver did not show any mass or signs of cancer.

## 2016-06-19 NOTE — Telephone Encounter (Signed)
Pt notified of MRI results

## 2016-06-21 DIAGNOSIS — S82851S Displaced trimalleolar fracture of right lower leg, sequela: Secondary | ICD-10-CM | POA: Diagnosis not present

## 2016-06-21 DIAGNOSIS — M85671 Other cyst of bone, right ankle and foot: Secondary | ICD-10-CM | POA: Diagnosis not present

## 2016-06-21 DIAGNOSIS — M12571 Traumatic arthropathy, right ankle and foot: Secondary | ICD-10-CM | POA: Diagnosis not present

## 2016-06-21 DIAGNOSIS — M25571 Pain in right ankle and joints of right foot: Secondary | ICD-10-CM | POA: Diagnosis not present

## 2016-06-26 DIAGNOSIS — M25571 Pain in right ankle and joints of right foot: Secondary | ICD-10-CM | POA: Diagnosis not present

## 2016-06-28 DIAGNOSIS — S82851S Displaced trimalleolar fracture of right lower leg, sequela: Secondary | ICD-10-CM | POA: Diagnosis not present

## 2016-06-28 DIAGNOSIS — M85671 Other cyst of bone, right ankle and foot: Secondary | ICD-10-CM | POA: Diagnosis not present

## 2016-06-28 DIAGNOSIS — M12571 Traumatic arthropathy, right ankle and foot: Secondary | ICD-10-CM | POA: Diagnosis not present

## 2016-07-02 ENCOUNTER — Encounter: Payer: Self-pay | Admitting: Hematology and Oncology

## 2016-07-12 ENCOUNTER — Other Ambulatory Visit: Payer: Self-pay | Admitting: Hematology and Oncology

## 2016-07-12 ENCOUNTER — Inpatient Hospital Stay: Payer: PPO | Attending: Hematology and Oncology

## 2016-07-12 DIAGNOSIS — Z79899 Other long term (current) drug therapy: Secondary | ICD-10-CM | POA: Insufficient documentation

## 2016-07-12 DIAGNOSIS — E538 Deficiency of other specified B group vitamins: Secondary | ICD-10-CM | POA: Insufficient documentation

## 2016-07-12 MED ORDER — CYANOCOBALAMIN 1000 MCG/ML IJ SOLN
1000.0000 ug | Freq: Once | INTRAMUSCULAR | Status: AC
  Administered 2016-07-12: 1000 ug via INTRAMUSCULAR
  Filled 2016-07-12: qty 1

## 2016-08-09 ENCOUNTER — Inpatient Hospital Stay: Payer: PPO | Attending: Hematology and Oncology

## 2016-08-09 DIAGNOSIS — Z79899 Other long term (current) drug therapy: Secondary | ICD-10-CM | POA: Insufficient documentation

## 2016-08-09 DIAGNOSIS — E538 Deficiency of other specified B group vitamins: Secondary | ICD-10-CM | POA: Insufficient documentation

## 2016-08-09 MED ORDER — CYANOCOBALAMIN 1000 MCG/ML IJ SOLN
1000.0000 ug | Freq: Once | INTRAMUSCULAR | Status: AC
  Administered 2016-08-09: 1000 ug via INTRAMUSCULAR
  Filled 2016-08-09: qty 1

## 2016-08-13 ENCOUNTER — Telehealth: Payer: Self-pay | Admitting: Family Medicine

## 2016-08-13 ENCOUNTER — Ambulatory Visit (INDEPENDENT_AMBULATORY_CARE_PROVIDER_SITE_OTHER): Payer: PPO | Admitting: Family Medicine

## 2016-08-13 ENCOUNTER — Encounter: Payer: Self-pay | Admitting: Family Medicine

## 2016-08-13 VITALS — BP 138/82 | HR 82 | Temp 98.4°F | Resp 16 | Wt 232.0 lb

## 2016-08-13 DIAGNOSIS — R7303 Prediabetes: Secondary | ICD-10-CM | POA: Diagnosis not present

## 2016-08-13 DIAGNOSIS — R808 Other proteinuria: Secondary | ICD-10-CM

## 2016-08-13 DIAGNOSIS — K5903 Drug induced constipation: Secondary | ICD-10-CM | POA: Diagnosis not present

## 2016-08-13 DIAGNOSIS — Z01818 Encounter for other preprocedural examination: Secondary | ICD-10-CM | POA: Diagnosis not present

## 2016-08-13 DIAGNOSIS — I10 Essential (primary) hypertension: Secondary | ICD-10-CM

## 2016-08-13 LAB — POCT URINALYSIS DIPSTICK
Bilirubin, UA: NEGATIVE
Glucose, UA: NEGATIVE
Ketones, UA: NEGATIVE
Leukocytes, UA: NEGATIVE
Nitrite, UA: NEGATIVE
PH UA: 6
RBC UA: NEGATIVE
SPEC GRAV UA: 1.01
UROBILINOGEN UA: 0.2

## 2016-08-13 MED ORDER — NALOXEGOL OXALATE 25 MG PO TABS: 25.0000 mg | ORAL_TABLET | Freq: Every day | ORAL | 0 refills | Status: DC

## 2016-08-13 NOTE — Progress Notes (Addendum)
Kenneth Brown  MRN: [id number] DOB: [birth year]  Subjective:  HPI  Patient is here for pre op exam. He is scheduled to have Right total ankle replacement on 08/29/16 by Dr Veins at [facility]. He does have pain his right ankle, he has trouble with walking at times with pain. No concerns for after or before procedure except wondering if he can take something to help with constipation that he usually develops after narcotics they give him for the pain. Patient does not smoke. He does have pre op scheduled for next Monday with orthopedic office. Review of systems negative for chest pain, dyspnea, diaphoresis, or any neurologic symptoms at all. Patient Active Problem List   Diagnosis Date Noted  . B12 deficiency 05/07/2015  . Splenomegaly 05/06/2015  . Thrombocytopenia ([place]) 04/17/2015  . Abnormal liver enzymes 03/02/2015  . Allergic rhinitis 03/02/2015  . Blood pressure elevated 03/02/2015  . Borderline diabetes 03/02/2015  . History of colon polyps 03/02/2015  . Personal history of traumatic fracture 03/02/2015  . H/O disease 03/02/2015  . H/O renal calculi 03/02/2015  . Personal history of infectious and parasitic disease 03/02/2015  . HLD (hyperlipidemia) 03/02/2015  . Diabetes mellitus, type 2 ([place]) 03/02/2015  . Adiposity 03/02/2015  . Arthritis 10/12/2014    Past Medical History:  Diagnosis Date  . Acute cystitis   . Diabetes mellitus without complication ([place])    pre diabetic  . Difficulty urinating   . Gross hematuria   . Platelets decreased ([place])   . Renal calculus, bilateral   . UTI (urinary tract infection) 10/2014    Social History   Social History  . Marital status: Married    Spouse name: N/A  . Number of children: N/A  . Years of education: N/A   Occupational History  . Not on file.   Social History Main Topics  . Smoking status: Never Smoker  . Smokeless tobacco: Never Used  . Alcohol use 0.0 - 0.6 oz/week  . Drug use: No  . Sexual  activity: Not on file   Other Topics Concern  . Not on file   Social History Narrative  . No narrative on file    Outpatient Encounter Prescriptions as of 08/13/2016  Medication Sig Note  . acetaminophen (TYLENOL) 500 MG tablet Take by mouth. 08/13/2016: prn  . naproxen (NAPROSYN) 250 MG tablet Take 500 mg by mouth as needed for mild pain or moderate pain. Reported on 02/13/2016 08/13/2016: prn  . losartan (COZAAR) 25 MG tablet Take 1 tablet (25 mg total) by mouth daily. (Patient not taking: Reported on 08/13/2016)   . [DISCONTINUED] ciprofloxacin (CIPRO) 500 MG tablet Take 1 tablet (500 mg total) by mouth 2 (two) times daily. (Patient not taking: Reported on 06/14/2016)    No facility-administered encounter medications on file as of 08/13/2016.     No Known Allergies  Review of Systems  Constitutional: Negative.   Respiratory: Negative.   Cardiovascular: Negative.   Gastrointestinal: Negative.   Musculoskeletal: Positive for joint pain and myalgias.  Skin: Negative.   Neurological: Negative.   Endo/Heme/Allergies: Negative.   Psychiatric/Behavioral: Negative.    Objective:  BP 138/82   Pulse 82   Temp 98.4 F (36.9 C)   Resp 16   Wt 232 lb (105.2 kg)   BMI 33.77 kg/m   Physical Exam  Constitutional: He is oriented to person, place, and time and well-developed, well-nourished, and in no distress.  HENT:  Head: Normocephalic and atraumatic.  Eyes: Conjunctivae are  normal. Pupils are equal, round, and reactive to light.  Cardiovascular: Normal rate, regular rhythm, normal heart sounds and intact distal pulses.   No murmur heard. Pulmonary/Chest: Effort normal and breath sounds normal. No respiratory distress. He has no wheezes.  Abdominal: There is no tenderness. A hernia is present. Hernia confirmed positive in the umbilical area.  Musculoskeletal: He exhibits no edema or tenderness.  Neurological: He is alert and oriented to person, place, and time.  Psychiatric:  Mood, memory, affect and judgment normal.    Assessment and Plan :  1. Pre-op examination EKG stable today. Patient cleared for surgery. - POCT urinalysis dipstick - EKG 12-Lead  2. Pre-diabetes - HgB A1c  3. Essential hypertension Stable. - CBC w/Diff/Platelet - Comprehensive metabolic panel - POCT urinalysis dipstick  4. Other proteinuria Advised patient he needs to be taking Losartan for kidney protection. Follow.  5. Constipation due to pain medication Will try Movantik to help post surgery been on narcotics. 6. Small umbilical hernia HPI, Exam and A&P transcribed under direction and in the presence of Miguel Aschoff, MD. I have done the exam and reviewed the chart and it is accurate to the best of my knowledge. Miguel Aschoff M.D. [facility]

## 2016-08-13 NOTE — Telephone Encounter (Signed)
No sorry.

## 2016-08-13 NOTE — Telephone Encounter (Signed)
Spoke with Dr. Rosanna Randy to verify message below, yes ok for samples. Medicare does not participate in coverage of this medication and that is why they would have to pay a lot for it.-aa

## 2016-08-13 NOTE — Telephone Encounter (Signed)
We have samples, ok to give?-aa

## 2016-08-13 NOTE — Telephone Encounter (Signed)
Pt's wife Caren Griffins stated the naloxegol oxalate (MOVANTIK) 25 MG TABS tablet Was going to be 85$ out of pocket b/c Medicare doesn't cover and they can't afford the cost. Wife wanted to see if we had samples or if there was something else cheaper that pt could try. [place]. Please advise. Thanks TNP

## 2016-08-14 LAB — COMPREHENSIVE METABOLIC PANEL
ALK PHOS: 78 IU/L (ref 39–117)
ALT: 34 IU/L (ref 0–44)
AST: 40 IU/L (ref 0–40)
Albumin/Globulin Ratio: 1.8 (ref 1.2–2.2)
Albumin: 4.6 g/dL (ref 3.6–4.8)
BUN/Creatinine Ratio: 11 (ref 10–24)
BUN: 12 mg/dL (ref 8–27)
Bilirubin Total: 0.9 mg/dL (ref 0.0–1.2)
CALCIUM: 9.6 mg/dL (ref 8.6–10.2)
CO2: 25 mmol/L (ref 18–29)
CREATININE: 1.11 mg/dL (ref 0.76–1.27)
Chloride: 101 mmol/L (ref 96–106)
GFR calc Af Amer: 80 mL/min/{1.73_m2} (ref >59)
GFR, EST NON AFRICAN AMERICAN: 69 mL/min/{1.73_m2} (ref >59)
GLUCOSE: 95 mg/dL (ref 65–99)
Globulin, Total: 2.6 g/dL (ref 1.5–4.5)
Potassium: 5 mmol/L (ref 3.5–5.2)
Sodium: 140 mmol/L (ref 134–144)
Total Protein: 7.2 g/dL (ref 6.0–8.5)

## 2016-08-14 LAB — CBC WITH DIFFERENTIAL/PLATELET
BASOS ABS: 0 10*3/uL (ref 0.0–0.2)
BASOS: 0 %
EOS (ABSOLUTE): 0.1 10*3/uL (ref 0.0–0.4)
Eos: 2 %
HEMATOCRIT: 48.7 % (ref 37.5–51.0)
Hemoglobin: 17 g/dL (ref 12.6–17.7)
IMMATURE GRANS (ABS): 0 10*3/uL (ref 0.0–0.1)
IMMATURE GRANULOCYTES: 0 %
LYMPHS: 12 %
Lymphocytes Absolute: 0.8 10*3/uL (ref 0.7–3.1)
MCH: 30.6 pg (ref 26.6–33.0)
MCHC: 34.9 g/dL (ref 31.5–35.7)
MCV: 88 fL (ref 79–97)
Monocytes Absolute: 0.4 10*3/uL (ref 0.1–0.9)
Monocytes: 6 %
NEUTROS PCT: 80 %
Neutrophils Absolute: 5.3 10*3/uL (ref 1.4–7.0)
PLATELETS: 84 10*3/uL — AB (ref 150–379)
RBC: 5.55 x10E6/uL (ref 4.14–5.80)
RDW: 15.8 % — AB (ref 12.3–15.4)
WBC: 6.7 10*3/uL (ref 3.4–10.8)

## 2016-08-14 LAB — HEMOGLOBIN A1C
ESTIMATED AVERAGE GLUCOSE: 120 mg/dL
HEMOGLOBIN A1C: 5.8 % — AB (ref 4.8–5.6)

## 2016-08-15 ENCOUNTER — Telehealth: Payer: Self-pay

## 2016-08-15 NOTE — Telephone Encounter (Signed)
Pt's wife Jenny Reichmann advised. Is on DPR. Renaldo Fiddler, CMA

## 2016-08-15 NOTE — Telephone Encounter (Signed)
-----   Message from Jerrol Banana., MD sent at 08/15/2016  1:45 PM EDT ----- Labs stable/platelets stable.

## 2016-08-20 DIAGNOSIS — S82851S Displaced trimalleolar fracture of right lower leg, sequela: Secondary | ICD-10-CM | POA: Diagnosis not present

## 2016-08-20 DIAGNOSIS — Z01818 Encounter for other preprocedural examination: Secondary | ICD-10-CM | POA: Diagnosis not present

## 2016-08-22 NOTE — Telephone Encounter (Signed)
Kenneth Brown with [facility] calling checking up on the medical clearance she faxed on 08-07-16 on the pt. Kenneth Brown  would also like a copy of last office visit, EKG & Labs along with the medical clearance. Kenneth Brown can be reached at  [phone] Ext. [place]    Fax # [phone].  Thanks CC

## 2016-08-22 NOTE — Telephone Encounter (Signed)
Dr. Rosanna Randy have you received form? KW

## 2016-08-23 NOTE — Telephone Encounter (Signed)
Checked up front, form already sent to scanning center, not in the chart yet, called elizabeth and left her a voicemail letting her know about this and if she wanted to re fax the form because I dont know how long it will be before we see it in the chart.-aa

## 2016-08-23 NOTE — Telephone Encounter (Signed)
Not to my knowledge.

## 2016-08-23 NOTE — Telephone Encounter (Signed)
This was done already earlier this week, I will find the form and re fax it again-aa

## 2016-08-24 NOTE — Telephone Encounter (Signed)
Spoke with Kenneth Brown and asked for notes and labs and EKG to be faxed as long as office note states that patient is cleared this should be sufficient. Information faxed -aa

## 2016-08-29 DIAGNOSIS — S82851S Displaced trimalleolar fracture of right lower leg, sequela: Secondary | ICD-10-CM | POA: Diagnosis not present

## 2016-08-29 DIAGNOSIS — Z87820 Personal history of traumatic brain injury: Secondary | ICD-10-CM | POA: Diagnosis not present

## 2016-08-29 DIAGNOSIS — M12571 Traumatic arthropathy, right ankle and foot: Secondary | ICD-10-CM | POA: Diagnosis not present

## 2016-08-29 DIAGNOSIS — E538 Deficiency of other specified B group vitamins: Secondary | ICD-10-CM | POA: Diagnosis not present

## 2016-08-29 DIAGNOSIS — R161 Splenomegaly, not elsewhere classified: Secondary | ICD-10-CM | POA: Diagnosis not present

## 2016-08-29 DIAGNOSIS — M6701 Short Achilles tendon (acquired), right ankle: Secondary | ICD-10-CM | POA: Diagnosis not present

## 2016-08-29 DIAGNOSIS — Z87442 Personal history of urinary calculi: Secondary | ICD-10-CM | POA: Diagnosis not present

## 2016-08-29 DIAGNOSIS — D696 Thrombocytopenia, unspecified: Secondary | ICD-10-CM | POA: Diagnosis not present

## 2016-08-29 DIAGNOSIS — Z6834 Body mass index (BMI) 34.0-34.9, adult: Secondary | ICD-10-CM | POA: Diagnosis not present

## 2016-08-29 DIAGNOSIS — R7303 Prediabetes: Secondary | ICD-10-CM | POA: Diagnosis not present

## 2016-08-29 DIAGNOSIS — E669 Obesity, unspecified: Secondary | ICD-10-CM | POA: Diagnosis not present

## 2016-09-04 ENCOUNTER — Telehealth: Payer: Self-pay | Admitting: *Deleted

## 2016-09-04 NOTE — Telephone Encounter (Signed)
He has an appt 11/16 for lab inj, he had surgery in [facility] 11/1 and had labs drawn then, asking if we can use the labs drawn in [place] which she has a copy of and not draw labs on 11/16. Please advise

## 2016-09-04 NOTE — Telephone Encounter (Signed)
Per Dr Mike Gip, need results ahead of time to make decision. Jenny Reichmann will bring them in to office

## 2016-09-06 ENCOUNTER — Inpatient Hospital Stay: Payer: PPO

## 2016-09-06 ENCOUNTER — Other Ambulatory Visit: Payer: PPO

## 2016-09-10 ENCOUNTER — Other Ambulatory Visit: Payer: Self-pay | Admitting: Family Medicine

## 2016-09-10 NOTE — Telephone Encounter (Signed)
Pt's wife Kenneth Brown stated pt was given Samples of naloxegol oxalate (MOVANTIK) 25 MG TABS tablet b/c he had surgery on 08/29/16 and was having to take pain medication. Kenneth Brown stated pt is having to take the pain medication longer than they thought and are requesting more Samples of naloxegol oxalate (MOVANTIK) 25 MG TABS tablet. Please advise. Thanks TNP

## 2016-09-10 NOTE — Telephone Encounter (Signed)
Samples are available in closet, please review and advise and how many tablets you would like to give? Thanks [employer]

## 2016-09-11 ENCOUNTER — Other Ambulatory Visit: Payer: Self-pay | Admitting: *Deleted

## 2016-09-11 ENCOUNTER — Ambulatory Visit: Payer: PPO | Admitting: Family Medicine

## 2016-09-11 ENCOUNTER — Telehealth: Payer: Self-pay | Admitting: *Deleted

## 2016-09-11 NOTE — Telephone Encounter (Signed)
Wife called to say that pt had blood work for 11/2 and he has appt for labs and b 12 inj. 11/16. They want to know if corcoran can review the labs and decide if he still needs labs done on 11/16 or not.  Corcoran reviewed labs from 11/2 and that is fine. He will still need to come to office for b 12 inj. But we will cancel the labs. Kenneth Brown notified wife and she cancelled the lab encounter.

## 2016-09-12 NOTE — Telephone Encounter (Signed)
Wife was advised samples left up front. KW

## 2016-09-12 NOTE — Telephone Encounter (Signed)
Wife called back inquiring about the samples.  Please call her back at and let her know [phone]

## 2016-09-12 NOTE — Telephone Encounter (Signed)
See message below and advise. Thanks

## 2016-09-13 ENCOUNTER — Inpatient Hospital Stay: Payer: PPO | Attending: Hematology and Oncology

## 2016-09-13 ENCOUNTER — Inpatient Hospital Stay: Payer: PPO

## 2016-09-13 DIAGNOSIS — S82851S Displaced trimalleolar fracture of right lower leg, sequela: Secondary | ICD-10-CM | POA: Diagnosis not present

## 2016-09-13 DIAGNOSIS — M12571 Traumatic arthropathy, right ankle and foot: Secondary | ICD-10-CM | POA: Diagnosis not present

## 2016-09-13 DIAGNOSIS — M6701 Short Achilles tendon (acquired), right ankle: Secondary | ICD-10-CM | POA: Diagnosis not present

## 2016-09-13 DIAGNOSIS — E538 Deficiency of other specified B group vitamins: Secondary | ICD-10-CM | POA: Insufficient documentation

## 2016-09-13 DIAGNOSIS — Z79899 Other long term (current) drug therapy: Secondary | ICD-10-CM | POA: Insufficient documentation

## 2016-09-13 MED ORDER — CYANOCOBALAMIN 1000 MCG/ML IJ SOLN
1000.0000 ug | Freq: Once | INTRAMUSCULAR | Status: AC
  Administered 2016-09-13: 1000 ug via INTRAMUSCULAR
  Filled 2016-09-13: qty 1

## 2016-09-27 DIAGNOSIS — M12571 Traumatic arthropathy, right ankle and foot: Secondary | ICD-10-CM | POA: Diagnosis not present

## 2016-10-03 ENCOUNTER — Ambulatory Visit (INDEPENDENT_AMBULATORY_CARE_PROVIDER_SITE_OTHER): Payer: PPO | Admitting: Physician Assistant

## 2016-10-03 DIAGNOSIS — Z23 Encounter for immunization: Secondary | ICD-10-CM | POA: Diagnosis not present

## 2016-10-04 ENCOUNTER — Inpatient Hospital Stay: Payer: PPO

## 2016-10-10 ENCOUNTER — Inpatient Hospital Stay: Payer: PPO | Attending: Hematology and Oncology

## 2016-10-10 DIAGNOSIS — E538 Deficiency of other specified B group vitamins: Secondary | ICD-10-CM

## 2016-10-10 DIAGNOSIS — Z79899 Other long term (current) drug therapy: Secondary | ICD-10-CM | POA: Insufficient documentation

## 2016-10-10 MED ORDER — CYANOCOBALAMIN 1000 MCG/ML IJ SOLN
1000.0000 ug | Freq: Once | INTRAMUSCULAR | Status: AC
  Administered 2016-10-10: 1000 ug via INTRAMUSCULAR
  Filled 2016-10-10: qty 1

## 2016-10-11 ENCOUNTER — Inpatient Hospital Stay: Payer: PPO

## 2016-10-31 IMAGING — MR MR ABDOMEN WO/W CM
8 of 16 series · 24 of 48 positions shown · IV contrast (10 ML EOVIST)
Comparison: CT 01/14/2015, ultrasound 07/14/2015

CLINICAL DATA: Cirrhosis follow-up.

EXAM:
MRI ABDOMEN WITHOUT AND WITH CONTRAST
TECHNIQUE: Multiplanar multisequence MR imaging of the abdomen was performed
both before and after the administration of intravenous contrast.
CONTRAST:  10 mL Eovist

[Series 2: T2 · coronal · 7.5mm · 1.25mm/px · 2 of 32 slices shown]
[im 1/32]
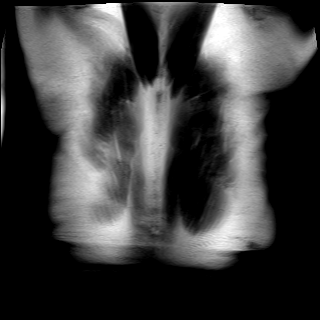
[im 32/32]
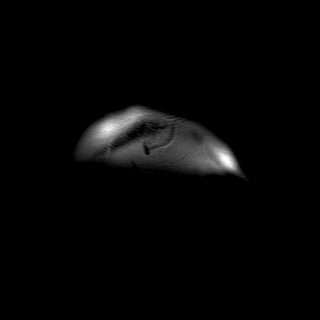

[Series 3: T1 · axial · 8.0mm · 0.78mm/px · z∈[-165,+94]mm · 3 of 56 slices shown]
[im 1/56]
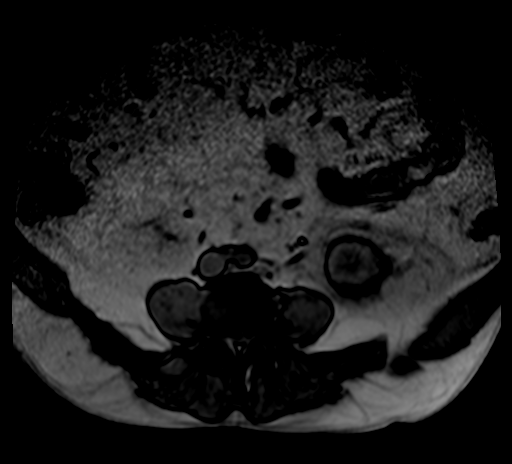
[im 28/56]
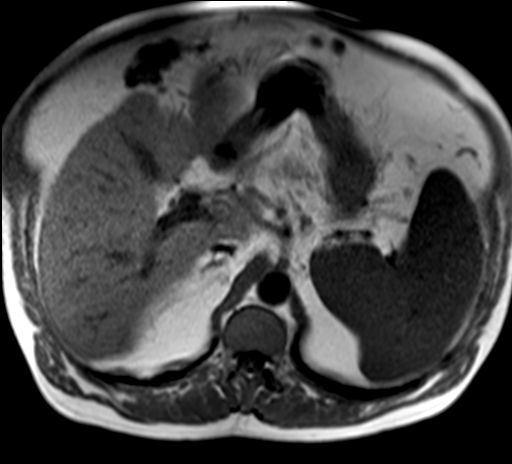
[im 56/56]
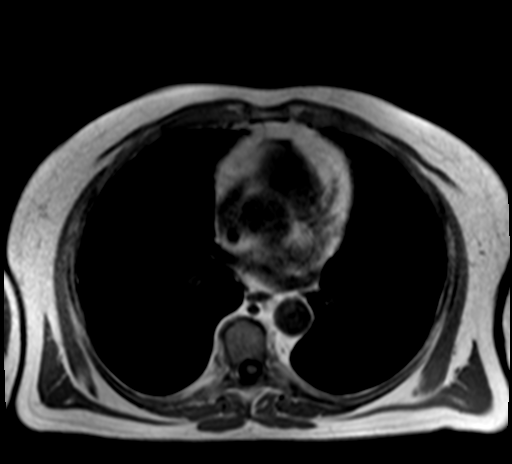

[Series 4: T1 dynamic fat-sat · axial · non-contrast · 3.0mm · 0.86mm/px · z∈[-190,+119]mm · 4 of 104 slices shown]
[im 1/104]
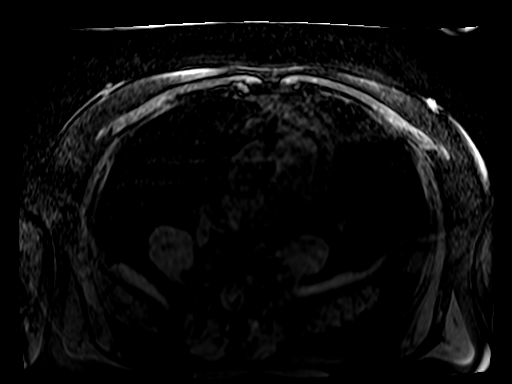
[im 35/104]
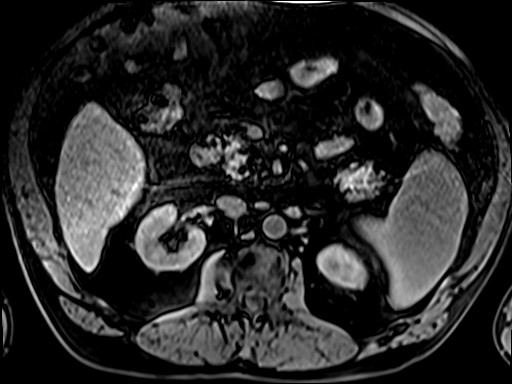
[im 69/104]
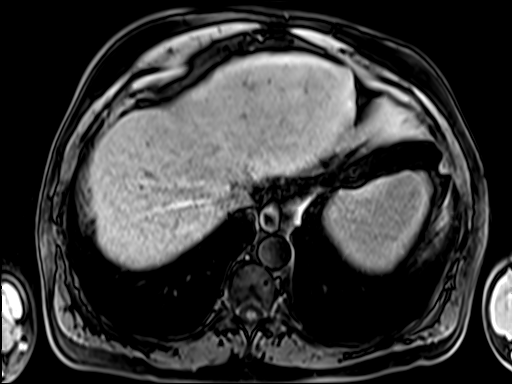
[im 104/104]
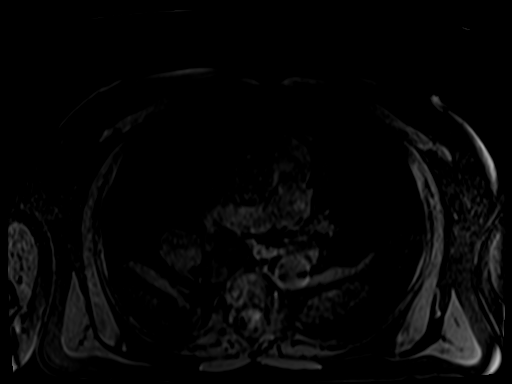

[Series 5: T1 dynamic · axial · 3.0mm · 0.86mm/px · z∈[-190,+119]mm · 4 of 104 slices shown (1 of 3)]
[im 1/104]
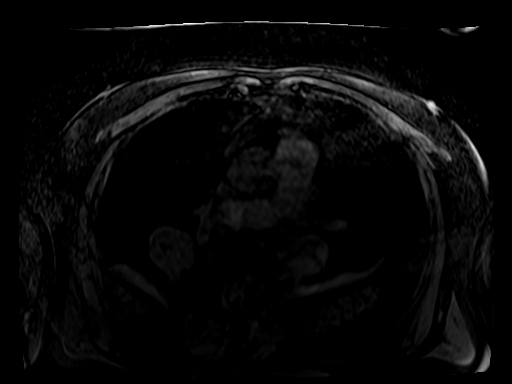
[im 35/104]
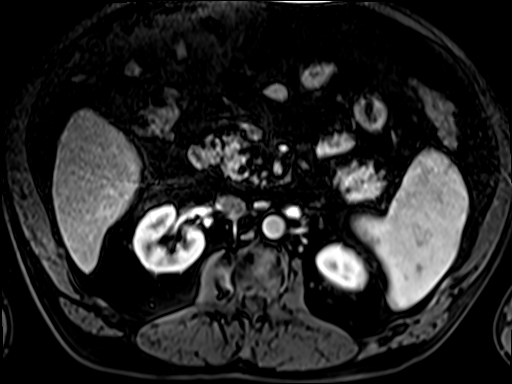
[im 69/104]
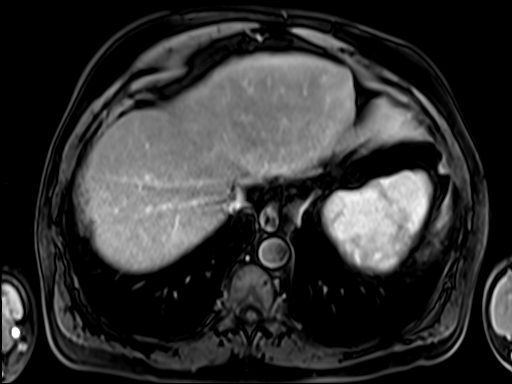
[im 104/104]
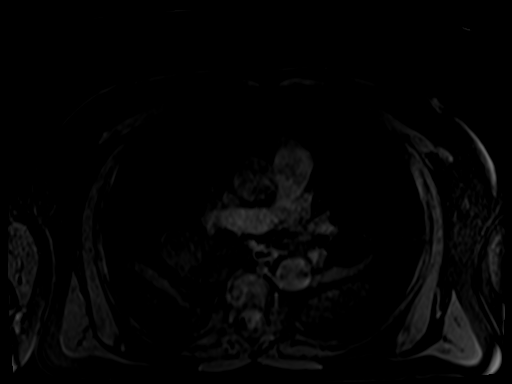

[Series 6: T1 dynamic · axial · 3.0mm · 0.86mm/px · z∈[-190,+119]mm · 3 of 104 slices shown (2 of 3)]
[im 1/104]
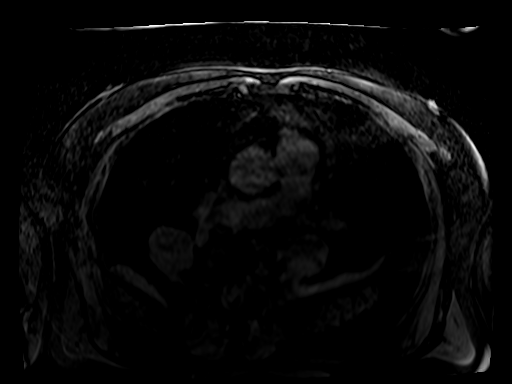
[im 52/104]
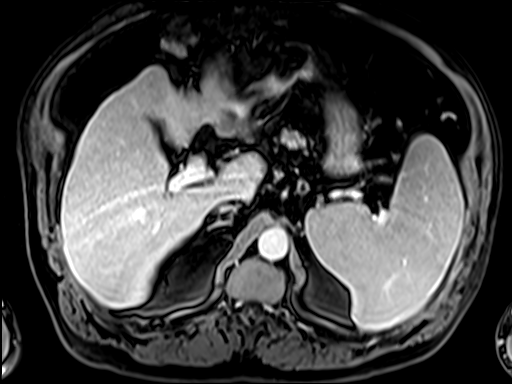
[im 104/104]
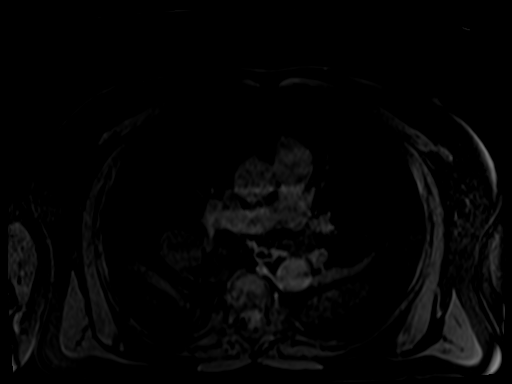

[Series 7: T1 dynamic · axial · 3.0mm · 0.86mm/px · z∈[-190,+119]mm · 3 of 104 slices shown (3 of 3)]
[im 1/104]
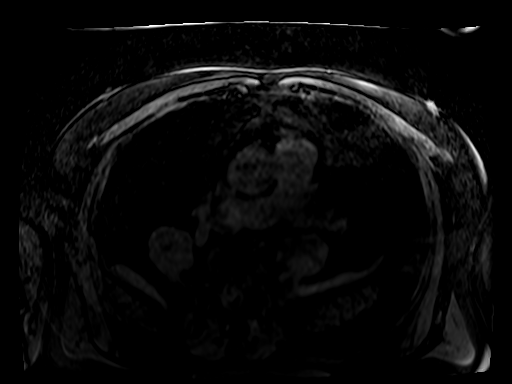
[im 52/104]
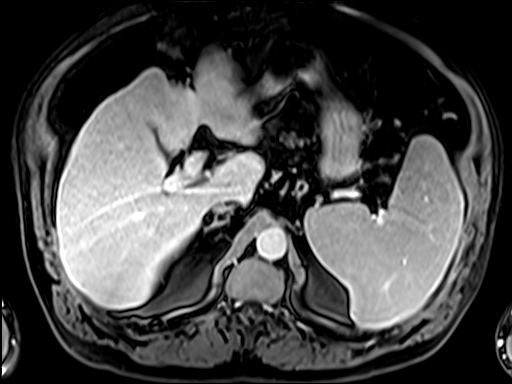
[im 104/104]
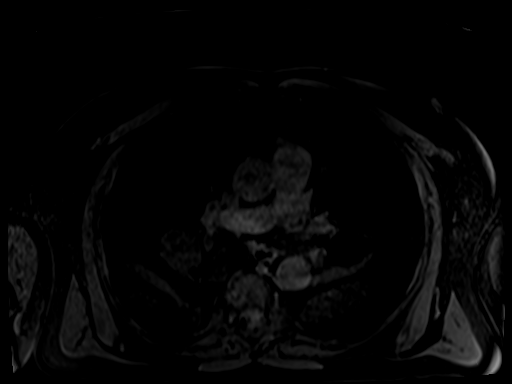

[Series 8: T1 dynamic fat-sat post-contrast · coronal · 3.0mm · 0.82mm/px · 4 of 112 slices shown]
[im 1/112]
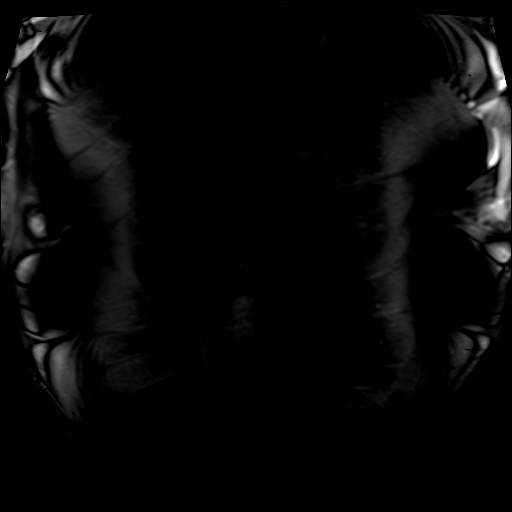
[im 38/112]
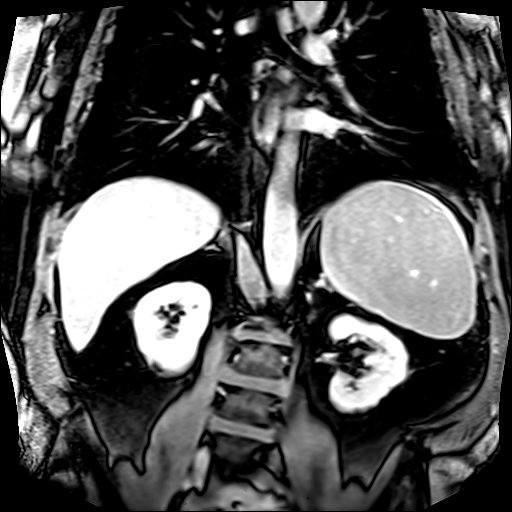
[im 75/112]
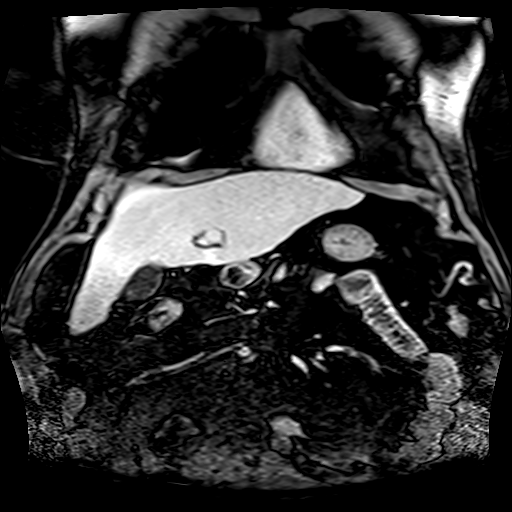
[im 112/112]
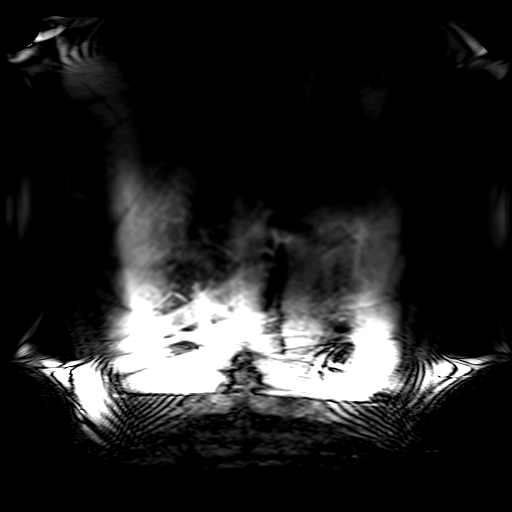

[Series 9: T2 fat-sat · axial · 8.0mm · 0.78mm/px · 1 of 28 slices shown]
[im 1/28]
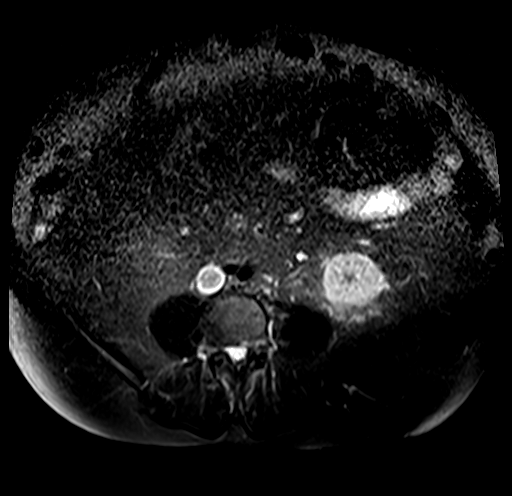

[24 of 48 positions shown; findings below may reference images not displayed]

FINDINGS: Lower chest: Focus of peripheral nodular atelectasis in the LEFT
lower lobe (image 4, series 9 similar to comparison CT.

Hepatobiliary: The liver has a mildly lobular contour. The caudate
lobe is prominent. There is no architectural distortion or
parenchymal fibrosis evident on the T2 weighted imaging. No focal
hepatic lesion on the noncontrast pulse sequences. No hepatic
steatosis.

No biliary duct dilatation. Gallbladder is normal. Common bile duct
normal.

On the post-contrast enhanced imaging, no focal enhancing hepatic
lesion present. No discrete lesion evident on the delayed 20 minutes
imaging.

No ascites present.  Portal veins are patent.

Pancreas: Normal pancreatic parenchymal intensity. No ductal
dilatation or inflammation.

Spleen: Spleen is is mildly enlarged with an estimated volume of
5644 cubic cm. (12.1 x 12.8 x 17.4 cm). Splenic vein is patent.

Adrenals/urinary tract: Adrenal glands and kidneys are normal.

Stomach/Bowel: Stomach and limited of the small bowel is
unremarkable

Vascular/Lymphatic: Abdominal aortic normal caliber. No
retroperitoneal periportal lymphadenopathy.

Musculoskeletal: No aggressive osseous lesion
IMPRESSION: 1. No enhancing lesion within liver parenchyma to suggests hepatoma.
2. Mild morphologic changes consistent cirrhosis.
3. Moderate splenomegaly.
4. No ascites.

## 2016-11-01 ENCOUNTER — Telehealth: Payer: Self-pay | Admitting: Family Medicine

## 2016-11-01 ENCOUNTER — Inpatient Hospital Stay: Payer: PPO

## 2016-11-01 MED ORDER — PROMETHAZINE HCL 25 MG PO TABS
25.0000 mg | ORAL_TABLET | Freq: Four times a day (QID) | ORAL | 0 refills | Status: DC | PRN
Start: 2016-11-01 — End: 2017-05-28

## 2016-11-01 NOTE — Telephone Encounter (Signed)
Pt is febrile; has temperature of 101.2. Pt has generalized mild abdominal pain. Pt also c/o chills. Wife reports that pt felt like he was trying to pass a kidney stone yesterday, without success. Pt has also been exposed to the stomach bug by his granddaughter last week. Please advise. Renaldo Fiddler, CMA

## 2016-11-01 NOTE — Telephone Encounter (Signed)
Wife advised and phenergan rx sent in. Renaldo Fiddler, CMA

## 2016-11-01 NOTE — Telephone Encounter (Signed)
Pt's wife called saying her husband is having a headache and throwing., diarrhea.  Started this am.  Does not want to come in.  Please advise  [phone]  Thanks teri

## 2016-11-01 NOTE — Telephone Encounter (Signed)
Tylenol and fluids right now. If he doesn't get better overnight he might need to be seen in the morning. We can call and 15 promethazine 25 mg tablets for any nausea.

## 2016-11-02 ENCOUNTER — Telehealth: Payer: Self-pay

## 2016-11-02 NOTE — Telephone Encounter (Signed)
We can see if zofran is covered better. I think it is because of age restrictions. If he didn't pick it up then we can use zofran 4mg  tablet. Take 1 tab PO q 6 hrs prn nausea/vomiting #30 NR

## 2016-11-02 NOTE — Telephone Encounter (Signed)
Question, received fax from [place] for PA for Promethazine we sent in yesterday for patient for acute issue. Proceed with PA or try a different medication in your opinion? Dr Alben Spittle patient-aa

## 2016-11-02 NOTE — Telephone Encounter (Signed)
Spoke with patient's wife and she states they picked up the medication yesterday, im not sure why pharmacy needed PA for this but patient is fine and is on medication-aa

## 2016-11-05 DIAGNOSIS — S82851S Displaced trimalleolar fracture of right lower leg, sequela: Secondary | ICD-10-CM | POA: Diagnosis not present

## 2016-11-05 DIAGNOSIS — M12571 Traumatic arthropathy, right ankle and foot: Secondary | ICD-10-CM | POA: Diagnosis not present

## 2016-11-05 DIAGNOSIS — M6701 Short Achilles tendon (acquired), right ankle: Secondary | ICD-10-CM | POA: Diagnosis not present

## 2016-11-07 ENCOUNTER — Encounter: Payer: Self-pay | Admitting: Family Medicine

## 2016-11-07 ENCOUNTER — Ambulatory Visit (INDEPENDENT_AMBULATORY_CARE_PROVIDER_SITE_OTHER): Payer: PPO | Admitting: Family Medicine

## 2016-11-07 VITALS — BP 124/72 | HR 72 | Temp 97.6°F | Resp 16 | Wt 246.0 lb

## 2016-11-07 DIAGNOSIS — E538 Deficiency of other specified B group vitamins: Secondary | ICD-10-CM | POA: Diagnosis not present

## 2016-11-07 MED ORDER — CYANOCOBALAMIN 1000 MCG/ML IJ SOLN
1000.0000 ug | Freq: Once | INTRAMUSCULAR | Status: AC
  Administered 2016-11-07: 1000 ug via INTRAMUSCULAR

## 2016-11-07 NOTE — Patient Instructions (Signed)
Every 2 weeks for B12 injections.

## 2016-11-07 NOTE — Progress Notes (Signed)
Subjective:  HPI Pt is here to discuss Vitamin B 12 injections. He has been going to the cancer center for these and did not have a good experience and wants to see about getting them here, if he needs them. He reports that they do not make him feel any different or make him have any more energy than he did before he started taking them.   Prior to Admission medications   Medication Sig Start Date End Date Taking? Authorizing Provider  aspirin EC 81 MG tablet Take 81 mg by mouth daily.   Yes Historical Provider, MD  losartan (COZAAR) 25 MG tablet Take 1 tablet (25 mg total) by mouth daily. 05/07/16  Yes Pluma Diniz Maceo Pro., MD  acetaminophen (TYLENOL) 500 MG tablet Take by mouth.    Historical Provider, MD  naproxen (NAPROSYN) 250 MG tablet Take 500 mg by mouth as needed for mild pain or moderate pain. Reported on 02/13/2016    Historical Provider, MD  promethazine (PHENERGAN) 25 MG tablet Take 1 tablet (25 mg total) by mouth every 6 (six) hours as needed for nausea or vomiting. Patient not taking: Reported on 11/07/2016 11/01/16   Jerrol Banana., MD    Patient Active Problem List   Diagnosis Date Noted  . B12 deficiency 05/07/2015  . Splenomegaly 05/06/2015  . Thrombocytopenia ([place]) 04/17/2015  . Abnormal liver enzymes 03/02/2015  . Allergic rhinitis 03/02/2015  . Blood pressure elevated 03/02/2015  . Borderline diabetes 03/02/2015  . History of colon polyps 03/02/2015  . Personal history of traumatic fracture 03/02/2015  . H/O disease 03/02/2015  . H/O renal calculi 03/02/2015  . Personal history of infectious and parasitic disease 03/02/2015  . HLD (hyperlipidemia) 03/02/2015  . Diabetes mellitus, type 2 ([place]) 03/02/2015  . Adiposity 03/02/2015  . Arthritis 10/12/2014    Past Medical History:  Diagnosis Date  . Acute cystitis   . Diabetes mellitus without complication ([place])    pre diabetic  . Difficulty urinating   . Gross hematuria   . Platelets decreased ([place])    . Renal calculus, bilateral   . UTI (urinary tract infection) 10/2014    Social History   Social History  . Marital status: Married    Spouse name: N/A  . Number of children: N/A  . Years of education: N/A   Occupational History  . Not on file.   Social History Main Topics  . Smoking status: Never Smoker  . Smokeless tobacco: Never Used  . Alcohol use 0.0 - 0.6 oz/week  . Drug use: No  . Sexual activity: Not on file   Other Topics Concern  . Not on file   Social History Narrative  . No narrative on file    No Known Allergies  Review of Systems  Constitutional: Positive for malaise/fatigue.  HENT: Negative.   Eyes: Negative.   Respiratory: Negative.   Cardiovascular: Negative.   Gastrointestinal: Negative.   Genitourinary: Negative.   Musculoskeletal: Negative.   Skin: Negative.   Neurological: Negative.   Endo/Heme/Allergies: Negative.   Psychiatric/Behavioral: Negative.     Immunization History  Administered Date(s) Administered  . Influenza, High Dose Seasonal PF 10/03/2016  . Pneumococcal Conjugate-13 01/24/2016  . Tdap 05/20/2013  . Zoster 05/20/2013    Objective:  BP 124/72 (BP Location: Left Arm, Patient Position: Sitting, Cuff Size: Large)   Pulse 72   Temp 97.6 F (36.4 C) (Oral)   Resp 16   Wt 246 lb (111.6 kg)  BMI 35.81 kg/m   Physical Exam  Constitutional: He is oriented to person, place, and time and well-developed, well-nourished, and in no distress.  Eyes: Conjunctivae and EOM are normal. Pupils are equal, round, and reactive to light.  Neck: Normal range of motion. Neck supple.  Cardiovascular: Normal rate, regular rhythm, normal heart sounds and intact distal pulses.   Pulmonary/Chest: Effort normal and breath sounds normal.  Abdominal: Soft. Bowel sounds are normal.  Obese, no HSM  Musculoskeletal: Normal range of motion.  Neurological: He is alert and oriented to person, place, and time. He has normal reflexes. Gait normal.  GCS score is 15.  Skin: Skin is warm and dry.  Psychiatric: Mood, memory, affect and judgment normal.    Lab Results  Component Value Date   WBC 6.7 08/13/2016   HGB 16.0 06/14/2016   HCT 48.7 08/13/2016   PLT 84 (LL) 08/13/2016   GLUCOSE 95 08/13/2016   CHOL 153 01/24/2016   TRIG 249 (H) 01/24/2016   HDL 32 (L) 01/24/2016   LDLCALC 71 01/24/2016   TSH 2.960 01/24/2016   PSA 1.1 11/03/2014   INR 1.17 08/19/2015   HGBA1C 5.8 (H) 08/13/2016   MICROALBUR 50 05/07/2016    CMP     Component Value Date/Time   NA 140 08/13/2016 1216   NA 138 08/30/2012 0342   K 5.0 08/13/2016 1216   K 4.1 08/30/2012 0342   CL 101 08/13/2016 1216   CL 105 08/30/2012 0342   CO2 25 08/13/2016 1216   CO2 26 08/30/2012 0342   GLUCOSE 95 08/13/2016 1216   GLUCOSE 95 03/08/2015 1414   GLUCOSE 131 (H) 08/30/2012 0342   BUN 12 08/13/2016 1216   BUN 13 08/30/2012 0342   CREATININE 1.11 08/13/2016 1216   CREATININE 1.26 08/30/2012 0342   CALCIUM 9.6 08/13/2016 1216   CALCIUM 8.2 (L) 08/30/2012 0342   PROT 7.2 08/13/2016 1216   PROT 7.8 08/28/2012 2207   ALBUMIN 4.6 08/13/2016 1216   ALBUMIN 4.1 08/28/2012 2207   AST 40 08/13/2016 1216   AST 49 (H) 08/28/2012 2207   ALT 34 08/13/2016 1216   ALT 50 08/28/2012 2207   ALKPHOS 78 08/13/2016 1216   ALKPHOS 88 08/28/2012 2207   BILITOT 0.9 08/13/2016 1216   BILITOT 0.9 08/28/2012 2207   GFRNONAA 69 08/13/2016 1216   GFRNONAA >60 08/30/2012 0342   GFRAA 80 08/13/2016 1216   GFRAA >60 08/30/2012 0342    Assessment and Plan :  1. B12 deficiency B12 injections every 2 weeks and follow up in 3 month.If not clinically feeling better will go back to once a month. - cyanocobalamin ((VITAMIN B-12)) injection 1,000 mcg; Inject 1 mL (1,000 mcg total) into the muscle once. 2. Thrombocytopenia Per hematology. 3. Status post ankle replacement Doing well. HPI, Exam, and A&P Transcribed under the direction and in the presence of Treysean Petruzzi L. Cranford Mon, MD    Electronically Signed: Webb Laws, CMA I have done the exam and reviewed the above chart and it is accurate to the best of my knowledge. Development worker, community has been used in this note in any air is in the dictation or transcription are unintentional.  [facility] 11/07/2016 3:40 PM

## 2016-11-12 ENCOUNTER — Inpatient Hospital Stay: Payer: PPO

## 2016-11-21 ENCOUNTER — Ambulatory Visit (INDEPENDENT_AMBULATORY_CARE_PROVIDER_SITE_OTHER): Payer: PPO

## 2016-11-21 DIAGNOSIS — E538 Deficiency of other specified B group vitamins: Secondary | ICD-10-CM | POA: Diagnosis not present

## 2016-11-21 MED ORDER — CYANOCOBALAMIN 1000 MCG/ML IJ SOLN
1000.0000 ug | Freq: Once | INTRAMUSCULAR | Status: AC
  Administered 2016-11-21: 1000 ug via INTRAMUSCULAR

## 2016-11-29 ENCOUNTER — Ambulatory Visit: Payer: PPO

## 2016-11-29 ENCOUNTER — Ambulatory Visit: Payer: PPO | Admitting: Hematology and Oncology

## 2016-11-29 ENCOUNTER — Inpatient Hospital Stay: Payer: PPO | Attending: Hematology and Oncology

## 2016-11-29 DIAGNOSIS — E538 Deficiency of other specified B group vitamins: Secondary | ICD-10-CM | POA: Diagnosis not present

## 2016-11-29 DIAGNOSIS — K7460 Unspecified cirrhosis of liver: Secondary | ICD-10-CM

## 2016-11-29 DIAGNOSIS — D696 Thrombocytopenia, unspecified: Secondary | ICD-10-CM

## 2016-11-29 LAB — CBC WITH DIFFERENTIAL/PLATELET
Basophils Absolute: 0 10*3/uL (ref 0–0.1)
Basophils Relative: 1 %
Eosinophils Absolute: 0.1 10*3/uL (ref 0–0.7)
Eosinophils Relative: 4 %
HCT: 44.2 % (ref 40.0–52.0)
Hemoglobin: 15.1 g/dL (ref 13.0–18.0)
Lymphocytes Relative: 23 %
Lymphs Abs: 0.9 10*3/uL — ABNORMAL LOW (ref 1.0–3.6)
MCH: 30.3 pg (ref 26.0–34.0)
MCHC: 34.2 g/dL (ref 32.0–36.0)
MCV: 88.5 fL (ref 80.0–100.0)
Monocytes Absolute: 0.3 10*3/uL (ref 0.2–1.0)
Monocytes Relative: 7 %
Neutro Abs: 2.7 10*3/uL (ref 1.4–6.5)
Neutrophils Relative %: 65 %
Platelets: 68 10*3/uL — ABNORMAL LOW (ref 150–440)
RBC: 5 MIL/uL (ref 4.40–5.90)
RDW: 15.9 % — ABNORMAL HIGH (ref 11.5–14.5)
WBC: 4.1 10*3/uL (ref 3.8–10.6)

## 2016-11-30 LAB — AFP TUMOR MARKER: AFP-Tumor Marker: 3.2 ng/mL (ref 0.0–8.3)

## 2016-12-05 ENCOUNTER — Ambulatory Visit (INDEPENDENT_AMBULATORY_CARE_PROVIDER_SITE_OTHER): Payer: PPO

## 2016-12-05 DIAGNOSIS — E538 Deficiency of other specified B group vitamins: Secondary | ICD-10-CM

## 2016-12-05 MED ORDER — CYANOCOBALAMIN 1000 MCG/ML IJ SOLN
1000.0000 ug | Freq: Once | INTRAMUSCULAR | Status: AC
  Administered 2016-12-05: 1000 ug via INTRAMUSCULAR

## 2016-12-19 ENCOUNTER — Ambulatory Visit (INDEPENDENT_AMBULATORY_CARE_PROVIDER_SITE_OTHER): Payer: PPO

## 2016-12-19 DIAGNOSIS — E538 Deficiency of other specified B group vitamins: Secondary | ICD-10-CM | POA: Diagnosis not present

## 2016-12-19 MED ORDER — CYANOCOBALAMIN 1000 MCG/ML IJ SOLN
1000.0000 ug | Freq: Once | INTRAMUSCULAR | Status: AC
  Administered 2016-12-19: 1000 ug via INTRAMUSCULAR

## 2017-01-02 ENCOUNTER — Ambulatory Visit (INDEPENDENT_AMBULATORY_CARE_PROVIDER_SITE_OTHER): Payer: PPO

## 2017-01-02 DIAGNOSIS — E538 Deficiency of other specified B group vitamins: Secondary | ICD-10-CM

## 2017-01-02 MED ORDER — CYANOCOBALAMIN 1000 MCG/ML IJ SOLN
1000.0000 ug | Freq: Once | INTRAMUSCULAR | Status: AC
  Administered 2017-01-02: 1000 ug via INTRAMUSCULAR

## 2017-01-16 ENCOUNTER — Ambulatory Visit (INDEPENDENT_AMBULATORY_CARE_PROVIDER_SITE_OTHER): Payer: PPO

## 2017-01-16 DIAGNOSIS — E531 Pyridoxine deficiency: Secondary | ICD-10-CM | POA: Diagnosis not present

## 2017-01-16 DIAGNOSIS — Z3A12 12 weeks gestation of pregnancy: Secondary | ICD-10-CM

## 2017-01-16 MED ORDER — CYANOCOBALAMIN 1000 MCG/ML IJ SOLN
1000.0000 ug | Freq: Once | INTRAMUSCULAR | Status: AC
  Administered 2017-01-16: 1000 ug via INTRAMUSCULAR

## 2017-01-30 ENCOUNTER — Ambulatory Visit (INDEPENDENT_AMBULATORY_CARE_PROVIDER_SITE_OTHER): Payer: PPO

## 2017-01-30 DIAGNOSIS — E538 Deficiency of other specified B group vitamins: Secondary | ICD-10-CM

## 2017-01-30 MED ORDER — CYANOCOBALAMIN 1000 MCG/ML IJ SOLN
1000.0000 ug | Freq: Once | INTRAMUSCULAR | Status: AC
  Administered 2017-01-30: 1000 ug via INTRAMUSCULAR

## 2017-02-04 DIAGNOSIS — M12571 Traumatic arthropathy, right ankle and foot: Secondary | ICD-10-CM | POA: Diagnosis not present

## 2017-02-13 ENCOUNTER — Ambulatory Visit (INDEPENDENT_AMBULATORY_CARE_PROVIDER_SITE_OTHER): Payer: PPO

## 2017-02-13 DIAGNOSIS — E538 Deficiency of other specified B group vitamins: Secondary | ICD-10-CM

## 2017-02-13 MED ORDER — CYANOCOBALAMIN 1000 MCG/ML IJ SOLN
1000.0000 ug | Freq: Once | INTRAMUSCULAR | Status: AC
  Administered 2017-02-13: 1000 ug via INTRAMUSCULAR

## 2017-02-19 ENCOUNTER — Encounter: Payer: Self-pay | Admitting: Family Medicine

## 2017-02-19 ENCOUNTER — Ambulatory Visit (INDEPENDENT_AMBULATORY_CARE_PROVIDER_SITE_OTHER): Payer: PPO | Admitting: Family Medicine

## 2017-02-19 ENCOUNTER — Ambulatory Visit: Payer: PPO

## 2017-02-19 VITALS — BP 138/74 | HR 88 | Temp 98.0°F | Wt 241.6 lb

## 2017-02-19 VITALS — BP 138/74 | HR 88 | Temp 98.0°F | Ht 70.0 in | Wt 241.6 lb

## 2017-02-19 DIAGNOSIS — E119 Type 2 diabetes mellitus without complications: Secondary | ICD-10-CM

## 2017-02-19 DIAGNOSIS — E785 Hyperlipidemia, unspecified: Secondary | ICD-10-CM | POA: Diagnosis not present

## 2017-02-19 DIAGNOSIS — Z23 Encounter for immunization: Secondary | ICD-10-CM

## 2017-02-19 DIAGNOSIS — D696 Thrombocytopenia, unspecified: Secondary | ICD-10-CM

## 2017-02-19 DIAGNOSIS — I10 Essential (primary) hypertension: Secondary | ICD-10-CM

## 2017-02-19 DIAGNOSIS — Z0000 Encounter for general adult medical examination without abnormal findings: Secondary | ICD-10-CM

## 2017-02-19 NOTE — Progress Notes (Signed)
Subjective:   Kenneth Brown is a 66 y.o. male who presents for Medicare Annual/Subsequent preventive examination.  Review of Systems:  N/A  Cardiac Risk Factors include: advanced age (>55men, >12 women);diabetes mellitus;dyslipidemia;male gender;obesity (BMI >30kg/m2)     Objective:    Vitals: BP 138/74 (BP Location: Right Arm)   Pulse 88   Temp 98 F (36.7 C) (Oral)   Ht 5\' 10"  (1.778 m)   Wt 241 lb 9.6 oz (109.6 kg)   BMI 34.67 kg/m   Body mass index is 34.67 kg/m.  Tobacco History  Smoking Status  . Never Smoker  Smokeless Tobacco  . Never Used     Counseling given: Not Answered   Past Medical History:  Diagnosis Date  . Acute cystitis   . Diabetes mellitus without complication ([place])    pre diabetic  . Difficulty urinating   . Gross hematuria   . Platelets decreased ([place])   . Renal calculus, bilateral   . UTI (urinary tract infection) 10/2014   Past Surgical History:  Procedure Laterality Date  . ANKLE SURGERY  08/28/12  . COLONOSCOPY WITH PROPOFOL N/A 07/08/2015   Procedure: COLONOSCOPY WITH PROPOFOL;  Surgeon: Lucilla Lame, MD;  Location: [facility];  Service: Endoscopy;  Laterality: N/A;  . ESOPHAGOGASTRODUODENOSCOPY (EGD) WITH PROPOFOL N/A 07/08/2015   Procedure: ESOPHAGOGASTRODUODENOSCOPY (EGD) WITH PROPOFOL;  Surgeon: Lucilla Lame, MD;  Location: [facility];  Service: Endoscopy;  Laterality: N/A;  . ORIF ANKLE FRACTURE Right    plate and screws, pt still has  . POLYPECTOMY  07/08/2015   Procedure: POLYPECTOMY;  Surgeon: Lucilla Lame, MD;  Location: [facility];  Service: Endoscopy;;  . URETEROSCOPY WITH HOLMIUM LASER LITHOTRIPSY Right 03/21/2015   Procedure: URETEROSCOPY WITH HOLMIUM LASER LITHOTRIPSY,retrograde pyelogram,stent placement;  Surgeon: Collier Flowers, MD;  Location: [facility];  Service: Urology;  Laterality: Right;   Family History  Problem Relation Age of Onset  . Hypertension Mother   . Heart disease Mother     . Stroke Mother   . COPD Mother   . Heart disease Father   . Heart attack Father   . Lung cancer Father   . Skin cancer Father    History  Sexual Activity  . Sexual activity: Not on file    Outpatient Encounter Prescriptions as of 02/19/2017  Medication Sig  . acetaminophen (TYLENOL) 500 MG tablet Take by mouth.   . losartan (COZAAR) 25 MG tablet Take 1 tablet (25 mg total) by mouth daily.  . naproxen (NAPROSYN) 250 MG tablet Take 500 mg by mouth as needed for mild pain or moderate pain. Reported on 02/13/2016  . vitamin C (ASCORBIC ACID) 500 MG tablet Take 500 mg by mouth daily.  Marland Kitchen aspirin EC 81 MG tablet Take 81 mg by mouth daily.  . promethazine (PHENERGAN) 25 MG tablet Take 1 tablet (25 mg total) by mouth every 6 (six) hours as needed for nausea or vomiting. (Patient not taking: Reported on 11/07/2016)   No facility-administered encounter medications on file as of 02/19/2017.     Activities of Daily Living In your present state of health, do you have any difficulty performing the following activities: 02/19/2017  Hearing? N  Vision? N  Difficulty concentrating or making decisions? N  Walking or climbing stairs? N  Dressing or bathing? N  Doing errands, shopping? N  Preparing Food and eating ? N  Using the Toilet? N  In the past six months, have you accidently leaked urine? N  Do you have problems with loss of bowel control? N  Managing your Medications? N  Managing your Finances? N  Housekeeping or managing your Housekeeping? N  Some recent data might be hidden    Patient Care Team: Jerrol Banana., MD as PCP - General (Family Medicine) Lucilla Lame, MD as Consulting Physician (Gastroenterology) Crista Curb. Gloriann Loan, MD as Consulting Physician (Ophthalmology) Colleen Can, MD as Referring Physician (Orthopedic Surgery)   Assessment:     Exercise Activities and Dietary recommendations Current Exercise Habits: The patient does not participate in regular exercise  at present, Exercise limited by: orthopedic condition(s);None identified  Goals    . Exercise           Recommend walking 3 days a week for 20-30 minutes.      Fall Risk Fall Risk  02/19/2017 01/24/2016  Falls in the past year? No No   Depression Screen PHQ 2/9 Scores 02/19/2017 01/24/2016  PHQ - 2 Score 0 0    Cognitive Function     6CIT Screen 02/19/2017  What Year? 0 points  What month? 0 points  What time? 0 points  Count back from 20 0 points  Months in reverse 0 points  Repeat phrase 0 points  Total Score 0    Immunization History  Administered Date(s) Administered  . Influenza, High Dose Seasonal PF 10/03/2016  . Pneumococcal Conjugate-13 01/24/2016  . Pneumococcal Polysaccharide-23 02/19/2017  . Tdap 05/20/2013  . Zoster 05/20/2013   Screening Tests Health Maintenance  Topic Date Due  . HEMOGLOBIN A1C  02/11/2017  . OPHTHALMOLOGY EXAM  03/29/2017 (Originally 11/29/2016)  . FOOT EXAM  05/07/2017  . INFLUENZA VACCINE  05/29/2017  . COLONOSCOPY  07/07/2018  . TETANUS/TDAP  05/21/2023  . Hepatitis C Screening  Completed  . PNA vac Low Risk Adult  Completed      Plan:  I have personally reviewed and addressed the Medicare Annual Wellness questionnaire and have noted the following in the patient's chart:  A. Medical and social history B. Use of alcohol, tobacco or illicit drugs  C. Current medications and supplements D. Functional ability and status E.  Nutritional status F.  Physical activity G. Advance directives H. List of other physicians I.  Hospitalizations, surgeries, and ER visits in previous 12 months J.  [place] such as hearing and vision if needed, cognitive and depression L. Referrals and appointments - none  In addition, I have reviewed and discussed with patient certain preventive protocols, quality metrics, and best practice recommendations. A written personalized care plan for preventive services as well as general preventive  health recommendations were provided to patient.  See attached scanned questionnaire for additional information.   Signed,  Fabio Neighbors, LPN Nurse Health Advisor   MD Recommendations: None. I have reviewed the health advisors note, was  available for consultation and I agree with documentation and plan. Miguel Aschoff MD [facility]

## 2017-02-19 NOTE — Patient Instructions (Signed)
[name] , Thank you for taking time to come for your Medicare Wellness Visit. I appreciate your ongoing commitment to your health goals. Please review the following plan we discussed and let me know if I can assist you in the future.   Screening recommendations/referrals: Colonoscopy: completed 07/15/15, due 06/2025 Recommended yearly ophthalmology/optometry visit for glaucoma screening and checkup Recommended yearly dental visit for hygiene and checkup  Vaccinations: Influenza vaccine: up to date, due 06/2018 Pneumococcal vaccine: up to date, completed series today Tdap vaccine: completed 05/20/13, due 04/2013 Shingles vaccine: completed    Advanced directives: Please bring a copy of your POA (Power of [place]) and/or Living Will to your next appointment.   Conditions/risks identified: Obesity: Recommend exercising 3 times a week for 20-30 minutes.  Next appointment: 02/27/17  Preventive Care 65 Years and Older, Male Preventive care refers to lifestyle choices and visits with your health care provider that can promote health and wellness. What does preventive care include?  A yearly physical exam. This is also called an annual well check.  Dental exams once or twice a year.  Routine eye exams. Ask your health care provider how often you should have your eyes checked.  Personal lifestyle choices, including:  Daily care of your teeth and gums.  Regular physical activity.  Eating a healthy diet.  Avoiding tobacco and drug use.  Limiting alcohol use.  Practicing safe sex.  Taking low doses of aspirin every day.  Taking vitamin and mineral supplements as recommended by your health care provider. What happens during an annual well check? The services and screenings done by your health care provider during your annual well check will depend on your age, overall health, lifestyle risk factors, and family history of disease. Counseling  Your health care provider may ask you  questions about your:  Alcohol use.  Tobacco use.  Drug use.  Emotional well-being.  Home and relationship well-being.  Sexual activity.  Eating habits.  History of falls.  Memory and ability to understand (cognition).  Work and work Statistician. Screening  You may have the following tests or measurements:  Height, weight, and BMI.  Blood pressure.  Lipid and cholesterol levels. These may be checked every 5 years, or more frequently if you are over 53 years old.  Skin check.  Lung cancer screening. You may have this screening every year starting at age 5 if you have a 30-pack-year history of smoking and currently smoke or have quit within the past 15 years.  Fecal occult blood test (FOBT) of the stool. You may have this test every year starting at age 41.  Flexible sigmoidoscopy or colonoscopy. You may have a sigmoidoscopy every 5 years or a colonoscopy every 10 years starting at age 51.  Prostate cancer screening. Recommendations will vary depending on your family history and other risks.  Hepatitis C blood test.  Hepatitis B blood test.  Sexually transmitted disease (STD) testing.  Diabetes screening. This is done by checking your blood sugar (glucose) after you have not eaten for a while (fasting). You may have this done every 1-3 years.  Abdominal aortic aneurysm (AAA) screening. You may need this if you are a current or former smoker.  Osteoporosis. You may be screened starting at age 18 if you are at high risk. Talk with your health care provider about your test results, treatment options, and if necessary, the need for more tests. Vaccines  Your health care provider may recommend certain vaccines, such as:  Influenza vaccine.  This is recommended every year.  Tetanus, diphtheria, and acellular pertussis (Tdap, Td) vaccine. You may need a Td booster every 10 years.  Zoster vaccine. You may need this after age 33.  Pneumococcal 13-valent conjugate  (PCV13) vaccine. One dose is recommended after age 35.  Pneumococcal polysaccharide (PPSV23) vaccine. One dose is recommended after age 33. Talk to your health care provider about which screenings and vaccines you need and how often you need them. This information is not intended to replace advice given to you by your health care provider. Make sure you discuss any questions you have with your health care provider. Document Released: 11/11/2015 Document Revised: 07/04/2016 Document Reviewed: 08/16/2015 Elsevier Interactive Patient Education  2017 [employer] Prevention in the Home Falls can cause injuries. They can happen to people of all ages. There are many things you can do to make your home safe and to help prevent falls. What can I do on the outside of my home?  Regularly fix the edges of walkways and driveways and fix any cracks.  Remove anything that might make you trip as you walk through a door, such as a raised step or threshold.  Trim any bushes or trees on the path to your home.  Use bright outdoor lighting.  Clear any walking paths of anything that might make someone trip, such as rocks or tools.  Regularly check to see if handrails are loose or broken. Make sure that both sides of any steps have handrails.  Any raised decks and porches should have guardrails on the edges.  Have any leaves, snow, or ice cleared regularly.  Use sand or salt on walking paths during winter.  Clean up any spills in your garage right away. This includes oil or grease spills. What can I do in the bathroom?  Use night lights.  Install grab bars by the toilet and in the tub and shower. Do not use towel bars as grab bars.  Use non-skid mats or decals in the tub or shower.  If you need to sit down in the shower, use a plastic, non-slip stool.  Keep the floor dry. Clean up any water that spills on the floor as soon as it happens.  Remove soap buildup in the tub or shower  regularly.  Attach bath mats securely with double-sided non-slip rug tape.  Do not have throw rugs and other things on the floor that can make you trip. What can I do in the bedroom?  Use night lights.  Make sure that you have a light by your bed that is easy to reach.  Do not use any sheets or blankets that are too big for your bed. They should not hang down onto the floor.  Have a firm chair that has side arms. You can use this for support while you get dressed.  Do not have throw rugs and other things on the floor that can make you trip. What can I do in the kitchen?  Clean up any spills right away.  Avoid walking on wet floors.  Keep items that you use a lot in easy-to-reach places.  If you need to reach something above you, use a strong step stool that has a grab bar.  Keep electrical cords out of the way.  Do not use floor polish or wax that makes floors slippery. If you must use wax, use non-skid floor wax.  Do not have throw rugs and other things on the floor that can make  you trip. What can I do with my stairs?  Do not leave any items on the stairs.  Make sure that there are handrails on both sides of the stairs and use them. Fix handrails that are broken or loose. Make sure that handrails are as long as the stairways.  Check any carpeting to make sure that it is firmly attached to the stairs. Fix any carpet that is loose or worn.  Avoid having throw rugs at the top or bottom of the stairs. If you do have throw rugs, attach them to the floor with carpet tape.  Make sure that you have a light switch at the top of the stairs and the bottom of the stairs. If you do not have them, ask someone to add them for you. What else can I do to help prevent falls?  Wear shoes that:  Do not have high heels.  Have rubber bottoms.  Are comfortable and fit you well.  Are closed at the toe. Do not wear sandals.  If you use a stepladder:  Make sure that it is fully  opened. Do not climb a closed stepladder.  Make sure that both sides of the stepladder are locked into place.  Ask someone to hold it for you, if possible.  Clearly mark and make sure that you can see:  Any grab bars or handrails.  First and last steps.  Where the edge of each step is.  Use tools that help you move around (mobility aids) if they are needed. These include:  Canes.  Walkers.  Scooters.  Crutches.  Turn on the lights when you go into a dark area. Replace any light bulbs as soon as they burn out.  Set up your furniture so you have a clear path. Avoid moving your furniture around.  If any of your floors are uneven, fix them.  If there are any pets around you, be aware of where they are.  Review your medicines with your doctor. Some medicines can make you feel dizzy. This can increase your chance of falling. Ask your doctor what other things that you can do to help prevent falls. This information is not intended to replace advice given to you by your health care provider. Make sure you discuss any questions you have with your health care provider. Document Released: 08/11/2009 Document Revised: 03/22/2016 Document Reviewed: 11/19/2014 Elsevier Interactive Patient Education  2017 [employer].

## 2017-02-19 NOTE — Progress Notes (Signed)
Subjective:  HPI   Pt is due for routine labs.   Diabetes Mellitus Type II, Follow-up:   Lab Results  Component Value Date   HGBA1C 5.8 (H) 08/13/2016   HGBA1C 6.1 05/07/2016   HGBA1C 6.3 (H) 01/24/2016    Last seen for diabetes 3 months ago.  Management since then includes none. He reports good compliance with treatment. He is not having side effects.  Home blood sugar records: 113-115 Current Insulin Regimen: n/a  Current exercise: none  Pertinent Labs:    Component Value Date/Time   CHOL 153 01/24/2016 1151   TRIG 249 (H) 01/24/2016 1151   HDL 32 (L) 01/24/2016 1151   LDLCALC 71 01/24/2016 1151   CREATININE 1.11 08/13/2016 1216   CREATININE 1.26 08/30/2012 0342    Wt Readings from Last 3 Encounters:  02/19/17 241 lb 9.6 oz (109.6 kg)  02/19/17 241 lb 9.6 oz (109.6 kg)  11/07/16 246 lb (111.6 kg)    ------------------------------------------------------------------------   Hypertension, follow-up:  BP Readings from Last 3 Encounters:  02/19/17 138/74  02/19/17 138/74  11/07/16 124/72    He was last seen for hypertension 3 months ago.  BP at that visit was 124/72. Management since that visit includes none. He reports good compliance with treatment. He is not having side effects.  He is not exercising. He is adherent to low salt diet.   Outside blood pressures are not being checked. He is experiencing none.  Patient denies chest pain, chest pressure/discomfort, claudication, dyspnea, exertional chest pressure/discomfort, fatigue, irregular heart beat, lower extremity edema, near-syncope, orthopnea, palpitations, paroxysmal nocturnal dyspnea, syncope and tachypnea.    Wt Readings from Last 3 Encounters:  02/19/17 241 lb 9.6 oz (109.6 kg)  02/19/17 241 lb 9.6 oz (109.6 kg)  11/07/16 246 lb (111.6 kg)   ------------------------------------------------------------------------    Prior to Admission medications   Medication Sig Start Date End  Date Taking? Authorizing Provider  acetaminophen (TYLENOL) 500 MG tablet Take by mouth.     Historical Provider, MD  aspirin EC 81 MG tablet Take 81 mg by mouth daily.    Historical Provider, MD  losartan (COZAAR) 25 MG tablet Take 1 tablet (25 mg total) by mouth daily. 05/07/16   Richard Maceo Pro., MD  naproxen (NAPROSYN) 250 MG tablet Take 500 mg by mouth as needed for mild pain or moderate pain. Reported on 02/13/2016    Historical Provider, MD  promethazine (PHENERGAN) 25 MG tablet Take 1 tablet (25 mg total) by mouth every 6 (six) hours as needed for nausea or vomiting. Patient not taking: Reported on 11/07/2016 11/01/16   Jerrol Banana., MD  vitamin C (ASCORBIC ACID) 500 MG tablet Take 500 mg by mouth daily.    Historical Provider, MD    Patient Active Problem List   Diagnosis Date Noted  . B12 deficiency 05/07/2015  . Splenomegaly 05/06/2015  . Thrombocytopenia ([place]) 04/17/2015  . Abnormal liver enzymes 03/02/2015  . Allergic rhinitis 03/02/2015  . Blood pressure elevated 03/02/2015  . Borderline diabetes 03/02/2015  . History of colon polyps 03/02/2015  . Personal history of traumatic fracture 03/02/2015  . H/O disease 03/02/2015  . H/O renal calculi 03/02/2015  . Personal history of infectious and parasitic disease 03/02/2015  . HLD (hyperlipidemia) 03/02/2015  . Diabetes mellitus, type 2 ([place]) 03/02/2015  . Adiposity 03/02/2015  . Arthritis 10/12/2014    Past Medical History:  Diagnosis Date  . Acute cystitis   . Diabetes mellitus without complication (  [place])    pre diabetic  . Difficulty urinating   . Gross hematuria   . Platelets decreased ([place])   . Renal calculus, bilateral   . UTI (urinary tract infection) 10/2014    Social History   Social History  . Marital status: Married    Spouse name: N/A  . Number of children: N/A  . Years of education: N/A   Occupational History  . Not on file.   Social History Main Topics  . Smoking status: Never Smoker    . Smokeless tobacco: Never Used  . Alcohol use 0.0 - 0.6 oz/week  . Drug use: No  . Sexual activity: Not on file   Other Topics Concern  . Not on file   Social History Narrative  . No narrative on file    No Known Allergies  Review of Systems  Constitutional: Negative.   HENT: Negative.   Eyes: Negative.   Respiratory: Negative.   Cardiovascular: Negative.   Gastrointestinal: Negative.   Genitourinary: Negative.   Musculoskeletal: Negative.   Skin: Negative.   Neurological: Negative.   Endo/Heme/Allergies: Negative.   Psychiatric/Behavioral: Negative.     Immunization History  Administered Date(s) Administered  . Influenza, High Dose Seasonal PF 10/03/2016  . Pneumococcal Conjugate-13 01/24/2016  . Pneumococcal Polysaccharide-23 02/19/2017  . Tdap 05/20/2013  . Zoster 05/20/2013    Objective:  BP 138/74 (BP Location: Left Arm, Patient Position: Sitting, Cuff Size: Normal)   Pulse 88   Temp 98 F (36.7 C) (Oral)   Wt 241 lb 9.6 oz (109.6 kg)   BMI 34.67 kg/m   Physical Exam  Constitutional: He is oriented to person, place, and time and well-developed, well-nourished, and in no distress.  Eyes: Conjunctivae and EOM are normal. Pupils are equal, round, and reactive to light.  Neck: Normal range of motion. Neck supple.  Cardiovascular: Normal rate, regular rhythm, normal heart sounds and intact distal pulses.   Pulmonary/Chest: Effort normal and breath sounds normal.  Musculoskeletal: Normal range of motion.  Neurological: He is alert and oriented to person, place, and time. He has normal reflexes. Gait normal. GCS score is 15.  Skin: Skin is warm and dry.  Psychiatric: Mood, memory, affect and judgment normal.    Lab Results  Component Value Date   WBC 4.1 11/29/2016   HGB 15.1 11/29/2016   HCT 44.2 11/29/2016   PLT 68 (L) 11/29/2016   GLUCOSE 95 08/13/2016   CHOL 153 01/24/2016   TRIG 249 (H) 01/24/2016   HDL 32 (L) 01/24/2016   LDLCALC 71  01/24/2016   TSH 2.960 01/24/2016   PSA 1.1 11/03/2014   INR 1.17 08/19/2015   HGBA1C 5.8 (H) 08/13/2016   MICROALBUR 50 05/07/2016    CMP     Component Value Date/Time   NA 140 08/13/2016 1216   NA 138 08/30/2012 0342   K 5.0 08/13/2016 1216   K 4.1 08/30/2012 0342   CL 101 08/13/2016 1216   CL 105 08/30/2012 0342   CO2 25 08/13/2016 1216   CO2 26 08/30/2012 0342   GLUCOSE 95 08/13/2016 1216   GLUCOSE 95 03/08/2015 1414   GLUCOSE 131 (H) 08/30/2012 0342   BUN 12 08/13/2016 1216   BUN 13 08/30/2012 0342   CREATININE 1.11 08/13/2016 1216   CREATININE 1.26 08/30/2012 0342   CALCIUM 9.6 08/13/2016 1216   CALCIUM 8.2 (L) 08/30/2012 0342   PROT 7.2 08/13/2016 1216   PROT 7.8 08/28/2012 2207   ALBUMIN 4.6 08/13/2016 1216  ALBUMIN 4.1 08/28/2012 2207   AST 40 08/13/2016 1216   AST 49 (H) 08/28/2012 2207   ALT 34 08/13/2016 1216   ALT 50 08/28/2012 2207   ALKPHOS 78 08/13/2016 1216   ALKPHOS 88 08/28/2012 2207   BILITOT 0.9 08/13/2016 1216   BILITOT 0.9 08/28/2012 2207   GFRNONAA 69 08/13/2016 1216   GFRNONAA >60 08/30/2012 0342   GFRAA 80 08/13/2016 1216   GFRAA >60 08/30/2012 0342    Assessment and Plan :  1. Type 2 diabetes mellitus without complication, unspecified whether long term insulin use (HCC)  - Hemoglobin A1c  2. Essential hypertension  - TSH  3. Dyslipidemia  - Lipid Panel With LDL/HDL Ratio - Comprehensive metabolic panel  4. Thrombocytopenia ([place])  - CBC with Differential/Platelet   HPI, Exam, and A&P Transcribed under the direction and in the presence of Richard L. Cranford Mon, MD  Electronically Signed: Katina Dung, CMA I have done the exam and reviewed the above chart and it is accurate to the best of my knowledge. Development worker, community has been used in this note in any air is in the dictation or transcription are unintentional.  [facility] 02/19/2017 1:45 PM

## 2017-02-20 DIAGNOSIS — E785 Hyperlipidemia, unspecified: Secondary | ICD-10-CM | POA: Diagnosis not present

## 2017-02-20 DIAGNOSIS — D696 Thrombocytopenia, unspecified: Secondary | ICD-10-CM | POA: Diagnosis not present

## 2017-02-20 DIAGNOSIS — E119 Type 2 diabetes mellitus without complications: Secondary | ICD-10-CM | POA: Diagnosis not present

## 2017-02-20 DIAGNOSIS — I10 Essential (primary) hypertension: Secondary | ICD-10-CM | POA: Diagnosis not present

## 2017-02-21 LAB — LIPID PANEL WITH LDL/HDL RATIO
CHOLESTEROL TOTAL: 154 mg/dL (ref 100–199)
HDL: 32 mg/dL — AB (ref >39)
LDL CALC: 58 mg/dL (ref 0–99)
LDl/HDL Ratio: 1.8 ratio (ref 0.0–3.6)
Triglycerides: 321 mg/dL — ABNORMAL HIGH (ref 0–149)
VLDL Cholesterol Cal: 64 mg/dL — ABNORMAL HIGH (ref 5–40)

## 2017-02-21 LAB — COMPREHENSIVE METABOLIC PANEL
ALBUMIN: 4.6 g/dL (ref 3.6–4.8)
ALT: 23 IU/L (ref 0–44)
AST: 27 IU/L (ref 0–40)
Albumin/Globulin Ratio: 2.2 (ref 1.2–2.2)
Alkaline Phosphatase: 76 IU/L (ref 39–117)
BUN / CREAT RATIO: 12 (ref 10–24)
BUN: 12 mg/dL (ref 8–27)
Bilirubin Total: 1.1 mg/dL (ref 0.0–1.2)
CALCIUM: 8.9 mg/dL (ref 8.6–10.2)
CO2: 23 mmol/L (ref 18–29)
CREATININE: 1.01 mg/dL (ref 0.76–1.27)
Chloride: 101 mmol/L (ref 96–106)
GFR, EST AFRICAN AMERICAN: 89 mL/min/{1.73_m2} (ref >59)
GFR, EST NON AFRICAN AMERICAN: 77 mL/min/{1.73_m2} (ref >59)
GLOBULIN, TOTAL: 2.1 g/dL (ref 1.5–4.5)
Glucose: 122 mg/dL — ABNORMAL HIGH (ref 65–99)
Potassium: 4.5 mmol/L (ref 3.5–5.2)
SODIUM: 140 mmol/L (ref 134–144)
Total Protein: 6.7 g/dL (ref 6.0–8.5)

## 2017-02-21 LAB — HEMOGLOBIN A1C
Est. average glucose Bld gHb Est-mCnc: 123 mg/dL
Hgb A1c MFr Bld: 5.9 % — ABNORMAL HIGH (ref 4.8–5.6)

## 2017-02-21 LAB — CBC WITH DIFFERENTIAL/PLATELET
BASOS ABS: 0 10*3/uL (ref 0.0–0.2)
Basos: 0 %
EOS (ABSOLUTE): 0.1 10*3/uL (ref 0.0–0.4)
EOS: 2 %
HEMOGLOBIN: 15.4 g/dL (ref 13.0–17.7)
Hematocrit: 45.9 % (ref 37.5–51.0)
IMMATURE GRANS (ABS): 0 10*3/uL (ref 0.0–0.1)
Immature Granulocytes: 0 %
LYMPHS: 17 %
Lymphocytes Absolute: 1.1 10*3/uL (ref 0.7–3.1)
MCH: 29.6 pg (ref 26.6–33.0)
MCHC: 33.6 g/dL (ref 31.5–35.7)
MCV: 88 fL (ref 79–97)
MONOCYTES: 7 %
Monocytes Absolute: 0.5 10*3/uL (ref 0.1–0.9)
NEUTROS ABS: 5 10*3/uL (ref 1.4–7.0)
Neutrophils: 74 %
PLATELETS: 70 10*3/uL — AB (ref 150–379)
RBC: 5.21 x10E6/uL (ref 4.14–5.80)
RDW: 15 % (ref 12.3–15.4)
WBC: 6.7 10*3/uL (ref 3.4–10.8)

## 2017-02-21 LAB — TSH: TSH: 6.3 u[IU]/mL — ABNORMAL HIGH (ref 0.450–4.500)

## 2017-02-27 ENCOUNTER — Ambulatory Visit (INDEPENDENT_AMBULATORY_CARE_PROVIDER_SITE_OTHER): Payer: PPO

## 2017-02-27 DIAGNOSIS — E538 Deficiency of other specified B group vitamins: Secondary | ICD-10-CM | POA: Diagnosis not present

## 2017-02-27 MED ORDER — CYANOCOBALAMIN 1000 MCG/ML IJ SOLN
1000.0000 ug | Freq: Once | INTRAMUSCULAR | Status: AC
  Administered 2017-02-27: 1000 ug via INTRAMUSCULAR

## 2017-02-28 ENCOUNTER — Telehealth: Payer: Self-pay | Admitting: Family Medicine

## 2017-02-28 MED ORDER — LEVOTHYROXINE SODIUM 50 MCG PO TABS: 50.0000 ug | ORAL_TABLET | Freq: Every day | ORAL | 3 refills | Status: DC

## 2017-02-28 NOTE — Telephone Encounter (Signed)
Done-aa 

## 2017-02-28 NOTE — Telephone Encounter (Signed)
Pt wife states pt was suppose to receive a new thyroid  medication but the pharmacy has not received anything.  Please advise.  [place].  VZ#[phone]/[phone]

## 2017-03-20 ENCOUNTER — Ambulatory Visit (INDEPENDENT_AMBULATORY_CARE_PROVIDER_SITE_OTHER): Payer: PPO

## 2017-03-20 DIAGNOSIS — E538 Deficiency of other specified B group vitamins: Secondary | ICD-10-CM

## 2017-03-20 MED ORDER — CYANOCOBALAMIN 1000 MCG/ML IJ SOLN
1000.0000 ug | Freq: Once | INTRAMUSCULAR | Status: AC
  Administered 2017-03-20: 1000 ug via INTRAMUSCULAR

## 2017-04-03 ENCOUNTER — Ambulatory Visit (INDEPENDENT_AMBULATORY_CARE_PROVIDER_SITE_OTHER): Payer: PPO

## 2017-04-03 DIAGNOSIS — E538 Deficiency of other specified B group vitamins: Secondary | ICD-10-CM

## 2017-04-03 MED ORDER — CYANOCOBALAMIN 1000 MCG/ML IJ SOLN
1000.0000 ug | Freq: Once | INTRAMUSCULAR | Status: AC
  Administered 2017-04-03: 1000 ug via INTRAMUSCULAR

## 2017-04-17 ENCOUNTER — Ambulatory Visit (INDEPENDENT_AMBULATORY_CARE_PROVIDER_SITE_OTHER): Payer: PPO

## 2017-04-17 DIAGNOSIS — E538 Deficiency of other specified B group vitamins: Secondary | ICD-10-CM | POA: Diagnosis not present

## 2017-04-17 MED ORDER — CYANOCOBALAMIN 1000 MCG/ML IJ SOLN
1000.0000 ug | Freq: Once | INTRAMUSCULAR | Status: AC
  Administered 2017-04-17: 1000 ug via INTRAMUSCULAR

## 2017-05-15 ENCOUNTER — Ambulatory Visit (INDEPENDENT_AMBULATORY_CARE_PROVIDER_SITE_OTHER): Payer: PPO

## 2017-05-15 DIAGNOSIS — E538 Deficiency of other specified B group vitamins: Secondary | ICD-10-CM

## 2017-05-15 MED ORDER — CYANOCOBALAMIN 1000 MCG/ML IJ SOLN
1000.0000 ug | Freq: Once | INTRAMUSCULAR | Status: AC
  Administered 2017-05-15: 1000 ug via INTRAMUSCULAR

## 2017-05-28 ENCOUNTER — Ambulatory Visit (INDEPENDENT_AMBULATORY_CARE_PROVIDER_SITE_OTHER): Payer: PPO | Admitting: Family Medicine

## 2017-05-28 VITALS — BP 118/62 | HR 96 | Temp 98.0°F | Resp 14 | Wt 242.0 lb

## 2017-05-28 DIAGNOSIS — E538 Deficiency of other specified B group vitamins: Secondary | ICD-10-CM

## 2017-05-28 DIAGNOSIS — M19079 Primary osteoarthritis, unspecified ankle and foot: Secondary | ICD-10-CM | POA: Insufficient documentation

## 2017-05-28 DIAGNOSIS — W010XXA Fall on same level from slipping, tripping and stumbling without subsequent striking against object, initial encounter: Secondary | ICD-10-CM

## 2017-05-28 DIAGNOSIS — S80211A Abrasion, right knee, initial encounter: Secondary | ICD-10-CM

## 2017-05-28 DIAGNOSIS — E038 Other specified hypothyroidism: Secondary | ICD-10-CM | POA: Diagnosis not present

## 2017-05-28 DIAGNOSIS — E119 Type 2 diabetes mellitus without complications: Secondary | ICD-10-CM

## 2017-05-28 DIAGNOSIS — S80212A Abrasion, left knee, initial encounter: Secondary | ICD-10-CM | POA: Diagnosis not present

## 2017-05-28 DIAGNOSIS — D696 Thrombocytopenia, unspecified: Secondary | ICD-10-CM

## 2017-05-28 LAB — POCT GLYCOSYLATED HEMOGLOBIN (HGB A1C): Hemoglobin A1C: 5.3

## 2017-05-28 MED ORDER — CYANOCOBALAMIN 1000 MCG/ML IJ SOLN
1000.0000 ug | Freq: Once | INTRAMUSCULAR | Status: AC
  Administered 2017-05-28: 1000 ug via INTRAMUSCULAR

## 2017-05-28 NOTE — Progress Notes (Signed)
Kenneth Brown  MRN: [id number] DOB: 09/30/1951  Subjective:  HPI  Patient is here to get B12 injection and discuss some issues.  Patient gets B12 shots every 2 weeks. Patient also fell yesterday due to tripping over the concrete part in the parking lot and went down on his knees and caught himself with left arm. Both knees bleed a lot yesterday, right knee the worst. Today no bleeding so far.  Diabetes: patient checks his sugar once daily and readings have been 115-118. Patient is not on medications for this. He does walk a lot, does not follow a diet routine. No numbness or tingling present. Patient is aware he is overdue for eye exam. Lab Results  Component Value Date   HGBA1C 5.9 (H) 02/20/2017    Patient Active Problem List   Diagnosis Date Noted  . Primary localized osteoarthrosis of ankle and foot 05/28/2017  . B12 deficiency 05/07/2015  . Splenomegaly 05/06/2015  . Thrombocytopenia ([place]) 04/17/2015  . Abnormal liver enzymes 03/02/2015  . Allergic rhinitis 03/02/2015  . Blood pressure elevated 03/02/2015  . Borderline diabetes 03/02/2015  . History of colon polyps 03/02/2015  . Personal history of traumatic fracture 03/02/2015  . H/O disease 03/02/2015  . H/O renal calculi 03/02/2015  . Personal history of infectious and parasitic disease 03/02/2015  . HLD (hyperlipidemia) 03/02/2015  . Diabetes mellitus, type 2 ([place]) 03/02/2015  . Adiposity 03/02/2015  . Arthritis 10/12/2014    Past Medical History:  Diagnosis Date  . Acute cystitis   . Diabetes mellitus without complication ([place])    pre diabetic  . Difficulty urinating   . Gross hematuria   . Platelets decreased ([place])   . Renal calculus, bilateral   . UTI (urinary tract infection) 10/2014    Social History   Social History  . Marital status: Married    Spouse name: N/A  . Number of children: N/A  . Years of education: N/A   Occupational History  . Not on file.   Social History Main Topics    . Smoking status: Never Smoker  . Smokeless tobacco: Never Used  . Alcohol use 0.0 - 0.6 oz/week  . Drug use: No  . Sexual activity: Not on file   Other Topics Concern  . Not on file   Social History Narrative  . No narrative on file    Outpatient Encounter Prescriptions as of 05/28/2017  Medication Sig Note  . acetaminophen (TYLENOL) 500 MG tablet Take by mouth.  08/13/2016: prn  . levothyroxine (SYNTHROID, LEVOTHROID) 50 MCG tablet Take 1 tablet (50 mcg total) by mouth daily.   Marland Kitchen losartan (COZAAR) 25 MG tablet Take 1 tablet (25 mg total) by mouth daily.   . vitamin C (ASCORBIC ACID) 500 MG tablet Take 500 mg by mouth daily.   . [DISCONTINUED] aspirin EC 81 MG tablet Take 81 mg by mouth daily.   . [DISCONTINUED] naproxen (NAPROSYN) 250 MG tablet Take 500 mg by mouth as needed for mild pain or moderate pain. Reported on 02/13/2016 08/13/2016: prn  . [DISCONTINUED] promethazine (PHENERGAN) 25 MG tablet Take 1 tablet (25 mg total) by mouth every 6 (six) hours as needed for nausea or vomiting. (Patient not taking: Reported on 02/19/2017)    No facility-administered encounter medications on file as of 05/28/2017.     No Known Allergies  Review of Systems  Constitutional: Negative.   Eyes: Negative.   Respiratory: Negative.   Cardiovascular: Negative.   Gastrointestinal: Negative.   Genitourinary: Negative.  Musculoskeletal: Positive for falls and joint pain.  Skin: Negative.        Abrasion and a cut on the knees  Neurological: Negative.   Endo/Heme/Allergies: Bruises/bleeds easily.  Psychiatric/Behavioral: Negative.     Objective:  BP 118/62   Pulse 96   Temp 98 F (36.7 C)   Resp 14   Wt 242 lb (109.8 kg)   BMI 34.72 kg/m   Physical Exam  Constitutional: He is oriented to person, place, and time and well-developed, well-nourished, and in no distress.  HENT:  Head: Normocephalic and atraumatic.  Right Ear: External ear normal.  Left Ear: External ear normal.   Nose: Nose normal.  Eyes: Conjunctivae are normal. No scleral icterus.  Neck: Neck supple. No thyromegaly present.  Cardiovascular: Normal rate, regular rhythm, normal heart sounds and intact distal pulses.  Exam reveals no gallop.   No murmur heard. Pulmonary/Chest: Effort normal and breath sounds normal. No respiratory distress. He has no wheezes.  Abdominal: Soft.  Musculoskeletal: He exhibits no edema, tenderness or deformity.  Neurological: He is alert and oriented to person, place, and time. Gait normal. GCS score is 15.  Skin: Skin is warm and dry.  Abrasion right knee from fall. Not infected presently.  Psychiatric: Mood, memory, affect and judgment normal.   Diabetic Foot Exam - Simple   Simple Foot Form Diabetic Foot exam was performed with the following findings:  Yes 05/28/2017 11:32 AM  Visual Inspection No deformities, no ulcerations, no other skin breakdown bilaterally:  Yes Sensation Testing Intact to touch and monofilament testing bilaterally:  Yes Pulse Check Posterior Tibialis and Dorsalis pulse intact bilaterally:  Yes Comments     Assessment and Plan :  1. B12 deficiency Administered today, gets this every 2 weeks. - cyanocobalamin ((VITAMIN B-12)) injection 1,000 mcg; Inject 1 mL (1,000 mcg total) into the muscle once.  2. Type 2 diabetes mellitus without complication, unspecified whether long term insulin use ([place]) A1C-5.3. Very well controlled. Continue working on habits. - POCT HgB A1C  3. Thrombocytopenia (HCC) Easy bleeding is not surprising with patient having low platelets. Follow. Check labs today and re check in 3 months. If platelets drop will refer back to hematology. - CBC with Differential/Platelet  4. Other specified hypothyroidism Re check levels since starting medication. - TSH  5. Abrasion of knee, bilateral Does not look infected at this time. Vaseline gauze and bandage applied to the right knee today. Advised patient to keep the  area cleaned. Follow as needed.  6. Fall on same level from slipping, tripping or stumbling, initial encounter Patient tripped in a parking lot of a restaurant. Patient did not hit his head. Doing ok today. Follow.  HPI, Exam and A&P transcribed by Theressa Millard, RMA under direction and in the presence of Miguel Aschoff, MD. I have done the exam and reviewed the chart and it is accurate to the best of my knowledge. Development worker, community has been used and  any errors in dictation or transcription are unintentional. Miguel Aschoff M.D. [facility]

## 2017-05-29 LAB — CBC WITH DIFFERENTIAL/PLATELET
BASOS ABS: 0 10*3/uL (ref 0.0–0.2)
Basos: 0 %
EOS (ABSOLUTE): 0.1 10*3/uL (ref 0.0–0.4)
Eos: 2 %
Hematocrit: 45 % (ref 37.5–51.0)
Hemoglobin: 14.8 g/dL (ref 13.0–17.7)
IMMATURE GRANS (ABS): 0 10*3/uL (ref 0.0–0.1)
IMMATURE GRANULOCYTES: 0 %
LYMPHS: 19 %
Lymphocytes Absolute: 0.9 10*3/uL (ref 0.7–3.1)
MCH: 29.8 pg (ref 26.6–33.0)
MCHC: 32.9 g/dL (ref 31.5–35.7)
MCV: 91 fL (ref 79–97)
Monocytes Absolute: 0.3 10*3/uL (ref 0.1–0.9)
Monocytes: 7 %
NEUTROS ABS: 3.4 10*3/uL (ref 1.4–7.0)
Neutrophils: 72 %
PLATELETS: 73 10*3/uL — AB (ref 150–379)
RBC: 4.96 x10E6/uL (ref 4.14–5.80)
RDW: 15.5 % — AB (ref 12.3–15.4)
WBC: 4.7 10*3/uL (ref 3.4–10.8)

## 2017-05-29 LAB — TSH: TSH: 2.8 u[IU]/mL (ref 0.450–4.500)

## 2017-06-10 ENCOUNTER — Other Ambulatory Visit: Payer: Self-pay | Admitting: Family Medicine

## 2017-06-10 DIAGNOSIS — R7303 Prediabetes: Secondary | ICD-10-CM

## 2017-06-12 ENCOUNTER — Ambulatory Visit: Payer: PPO

## 2017-06-20 DIAGNOSIS — H25813 Combined forms of age-related cataract, bilateral: Secondary | ICD-10-CM | POA: Diagnosis not present

## 2017-06-21 ENCOUNTER — Ambulatory Visit (INDEPENDENT_AMBULATORY_CARE_PROVIDER_SITE_OTHER): Payer: PPO | Admitting: Emergency Medicine

## 2017-06-21 DIAGNOSIS — E538 Deficiency of other specified B group vitamins: Secondary | ICD-10-CM

## 2017-06-21 DIAGNOSIS — Z3A12 12 weeks gestation of pregnancy: Secondary | ICD-10-CM

## 2017-06-21 MED ORDER — CYANOCOBALAMIN 1000 MCG/ML IJ SOLN
1000.0000 ug | Freq: Once | INTRAMUSCULAR | Status: AC
  Administered 2017-06-21: 1000 ug via INTRAMUSCULAR

## 2017-06-24 ENCOUNTER — Ambulatory Visit: Payer: PPO | Admitting: Family Medicine

## 2017-07-05 ENCOUNTER — Ambulatory Visit (INDEPENDENT_AMBULATORY_CARE_PROVIDER_SITE_OTHER): Payer: PPO

## 2017-07-05 DIAGNOSIS — E538 Deficiency of other specified B group vitamins: Secondary | ICD-10-CM

## 2017-07-05 MED ORDER — CYANOCOBALAMIN 1000 MCG/ML IJ SOLN
1000.0000 ug | Freq: Once | INTRAMUSCULAR | Status: AC
  Administered 2017-07-05: 1000 ug via INTRAMUSCULAR

## 2017-07-05 NOTE — Patient Instructions (Signed)
F/u in 2 weeks.  

## 2017-07-19 ENCOUNTER — Ambulatory Visit: Payer: Self-pay

## 2017-07-24 ENCOUNTER — Ambulatory Visit (INDEPENDENT_AMBULATORY_CARE_PROVIDER_SITE_OTHER): Payer: PPO

## 2017-07-24 DIAGNOSIS — E538 Deficiency of other specified B group vitamins: Secondary | ICD-10-CM

## 2017-07-24 MED ORDER — CYANOCOBALAMIN 1000 MCG/ML IJ SOLN
1000.0000 ug | Freq: Once | INTRAMUSCULAR | Status: AC
  Administered 2017-07-24: 1000 ug via INTRAMUSCULAR

## 2017-08-07 ENCOUNTER — Ambulatory Visit (INDEPENDENT_AMBULATORY_CARE_PROVIDER_SITE_OTHER): Payer: PPO

## 2017-08-07 DIAGNOSIS — E538 Deficiency of other specified B group vitamins: Secondary | ICD-10-CM

## 2017-08-07 MED ORDER — CYANOCOBALAMIN 1000 MCG/ML IJ SOLN
1000.0000 ug | Freq: Once | INTRAMUSCULAR | Status: AC
  Administered 2017-08-07: 1000 ug via INTRAMUSCULAR

## 2017-08-15 DIAGNOSIS — H40003 Preglaucoma, unspecified, bilateral: Secondary | ICD-10-CM | POA: Diagnosis not present

## 2017-08-21 ENCOUNTER — Ambulatory Visit (INDEPENDENT_AMBULATORY_CARE_PROVIDER_SITE_OTHER): Payer: PPO

## 2017-08-21 DIAGNOSIS — E538 Deficiency of other specified B group vitamins: Secondary | ICD-10-CM | POA: Diagnosis not present

## 2017-08-21 MED ORDER — CYANOCOBALAMIN 1000 MCG/ML IJ SOLN
1000.0000 ug | Freq: Once | INTRAMUSCULAR | Status: AC
  Administered 2017-08-21: 1000 ug via INTRAMUSCULAR

## 2017-08-26 ENCOUNTER — Ambulatory Visit (INDEPENDENT_AMBULATORY_CARE_PROVIDER_SITE_OTHER): Payer: PPO | Admitting: Family Medicine

## 2017-08-26 ENCOUNTER — Encounter: Payer: Self-pay | Admitting: Family Medicine

## 2017-08-26 VITALS — BP 136/76 | HR 88 | Temp 97.8°F | Resp 14 | Wt 234.0 lb

## 2017-08-26 DIAGNOSIS — D696 Thrombocytopenia, unspecified: Secondary | ICD-10-CM

## 2017-08-26 DIAGNOSIS — I10 Essential (primary) hypertension: Secondary | ICD-10-CM

## 2017-08-26 DIAGNOSIS — Z23 Encounter for immunization: Secondary | ICD-10-CM

## 2017-08-26 DIAGNOSIS — E119 Type 2 diabetes mellitus without complications: Secondary | ICD-10-CM

## 2017-08-26 NOTE — Progress Notes (Signed)
       Patient: Kenneth Brown Male    DOB: March 31, 1951   66 y.o.   MRN: [id number] Visit Date: 08/26/2017  Today's Provider: Wilhemena Durie, MD   Chief Complaint  Patient presents with  . Follow-up    thrombocytopenia   Subjective:    HPI  Pt is here today for a follow up for thrombocytopenia. He is due to have his labs rechecked. Pt reports that he is feeling well with no complaints.       No Known Allergies   Current Outpatient Prescriptions:  .  acetaminophen (TYLENOL) 500 MG tablet, Take by mouth. , Disp: , Rfl:  .  levothyroxine (SYNTHROID, LEVOTHROID) 50 MCG tablet, Take 1 tablet (50 mcg total) by mouth daily., Disp: 90 tablet, Rfl: 3 .  losartan (COZAAR) 25 MG tablet, TAKE 1 TABLET BY MOUTH DAILY, Disp: 30 tablet, Rfl: 11 .  vitamin C (ASCORBIC ACID) 500 MG tablet, Take 500 mg by mouth daily., Disp: , Rfl:   Review of Systems  Constitutional: Negative.   HENT: Negative.   Eyes: Negative.   Respiratory: Negative.   Cardiovascular: Negative.   Gastrointestinal: Negative.   Endocrine: Negative.   Genitourinary: Negative.   Musculoskeletal: Negative.   Skin: Negative.   Allergic/Immunologic: Negative.   Neurological: Negative.   Hematological: Negative.   Psychiatric/Behavioral: Negative.     Social History  Substance Use Topics  . Smoking status: Never Smoker  . Smokeless tobacco: Never Used  . Alcohol use 0.0 - 0.6 oz/week   Objective:   BP 136/76 (BP Location: Left Arm, Patient Position: Sitting, Cuff Size: Large)   Pulse 88   Temp 97.8 F (36.6 C) (Oral)   Resp 14   Wt 234 lb (106.1 kg)   BMI 33.58 kg/m  Vitals:   08/26/17 1058  BP: 136/76  Pulse: 88  Resp: 14  Temp: 97.8 F (36.6 C)  TempSrc: Oral  Weight: 234 lb (106.1 kg)     Physical Exam  Constitutional: He is oriented to person, place, and time. He appears well-developed and well-nourished.  HENT:  Head: Normocephalic and atraumatic.  Right Ear: External ear normal.   Left Ear: External ear normal.  Nose: Nose normal.  Eyes: Conjunctivae are normal. No scleral icterus.  Neck: No thyromegaly present.  Cardiovascular: Normal rate, regular rhythm and normal heart sounds.   Pulmonary/Chest: Effort normal and breath sounds normal.  Abdominal: Soft.  Neurological: He is alert and oriented to person, place, and time.  Skin: Skin is warm and dry.  Psychiatric: He has a normal mood and affect. His behavior is normal. Judgment and thought content normal.        Assessment & Plan:     1. Thrombocytopenia (HCC)  - CBC with Differential/Platelet  2. Essential hypertension  - CBC with Differential/Platelet - TSH  3. Need for influenza vaccination  - Flu vaccine HIGH DOSE PF  4. Type 2 diabetes mellitus without complication, unspecified whether long term insulin use (HCC)  - Hemoglobin A1c 5.Obesity  I have done the exam and reviewed the chart and it is accurate to the best of my knowledge. Development worker, community has been used and  any errors in dictation or transcription are unintentional. Miguel Aschoff M.D. [place], MD  [facility]

## 2017-08-29 DIAGNOSIS — Z96669 Presence of unspecified artificial ankle joint: Secondary | ICD-10-CM | POA: Insufficient documentation

## 2017-08-29 DIAGNOSIS — M85671 Other cyst of bone, right ankle and foot: Secondary | ICD-10-CM | POA: Diagnosis not present

## 2017-08-29 DIAGNOSIS — M8560 Other cyst of bone, unspecified site: Secondary | ICD-10-CM | POA: Insufficient documentation

## 2017-08-29 DIAGNOSIS — M12571 Traumatic arthropathy, right ankle and foot: Secondary | ICD-10-CM | POA: Diagnosis not present

## 2017-09-04 ENCOUNTER — Ambulatory Visit (INDEPENDENT_AMBULATORY_CARE_PROVIDER_SITE_OTHER): Payer: PPO

## 2017-09-04 DIAGNOSIS — D696 Thrombocytopenia, unspecified: Secondary | ICD-10-CM | POA: Diagnosis not present

## 2017-09-04 DIAGNOSIS — I10 Essential (primary) hypertension: Secondary | ICD-10-CM | POA: Diagnosis not present

## 2017-09-04 DIAGNOSIS — E538 Deficiency of other specified B group vitamins: Secondary | ICD-10-CM | POA: Diagnosis not present

## 2017-09-04 DIAGNOSIS — E119 Type 2 diabetes mellitus without complications: Secondary | ICD-10-CM | POA: Diagnosis not present

## 2017-09-04 MED ORDER — CYANOCOBALAMIN 1000 MCG/ML IJ SOLN
1000.0000 ug | Freq: Once | INTRAMUSCULAR | Status: AC
  Administered 2017-09-04: 1000 ug via INTRAMUSCULAR

## 2017-09-05 LAB — CBC WITH DIFFERENTIAL/PLATELET
BASOS PCT: 0.4 %
Basophils Absolute: 19 cells/uL (ref 0–200)
EOS ABS: 101 {cells}/uL (ref 15–500)
Eosinophils Relative: 2.1 %
HEMATOCRIT: 47 % (ref 38.5–50.0)
Hemoglobin: 16 g/dL (ref 13.2–17.1)
Lymphs Abs: 878 cells/uL (ref 850–3900)
MCH: 30 pg (ref 27.0–33.0)
MCHC: 34 g/dL (ref 32.0–36.0)
MCV: 88 fL (ref 80.0–100.0)
MPV: 9.6 fL (ref 7.5–12.5)
Monocytes Relative: 6.3 %
Neutro Abs: 3499 cells/uL (ref 1500–7800)
Neutrophils Relative %: 72.9 %
Platelets: 86 10*3/uL — ABNORMAL LOW (ref 140–400)
RBC: 5.34 10*6/uL (ref 4.20–5.80)
RDW: 14.1 % (ref 11.0–15.0)
TOTAL LYMPHOCYTE: 18.3 %
WBC: 4.8 10*3/uL (ref 3.8–10.8)
WBCMIX: 302 {cells}/uL (ref 200–950)

## 2017-09-05 LAB — TSH: TSH: 3.28 mIU/L (ref 0.40–4.50)

## 2017-09-05 LAB — HEMOGLOBIN A1C
EAG (MMOL/L): 6.2 (calc)
HEMOGLOBIN A1C: 5.5 %{Hb} (ref <5.7)
MEAN PLASMA GLUCOSE: 111 (calc)

## 2017-09-18 ENCOUNTER — Ambulatory Visit (INDEPENDENT_AMBULATORY_CARE_PROVIDER_SITE_OTHER): Payer: PPO

## 2017-09-18 DIAGNOSIS — E538 Deficiency of other specified B group vitamins: Secondary | ICD-10-CM | POA: Diagnosis not present

## 2017-09-18 MED ORDER — CYANOCOBALAMIN 1000 MCG/ML IJ SOLN
1000.0000 ug | Freq: Once | INTRAMUSCULAR | Status: AC
  Administered 2017-09-18: 1000 ug via INTRAMUSCULAR

## 2017-10-02 ENCOUNTER — Ambulatory Visit (INDEPENDENT_AMBULATORY_CARE_PROVIDER_SITE_OTHER): Payer: PPO

## 2017-10-02 DIAGNOSIS — E538 Deficiency of other specified B group vitamins: Secondary | ICD-10-CM | POA: Diagnosis not present

## 2017-10-02 MED ORDER — CYANOCOBALAMIN 1000 MCG/ML IJ SOLN
1000.0000 ug | Freq: Once | INTRAMUSCULAR | Status: AC
  Administered 2017-10-02: 1000 ug via INTRAMUSCULAR

## 2017-10-16 ENCOUNTER — Ambulatory Visit (INDEPENDENT_AMBULATORY_CARE_PROVIDER_SITE_OTHER): Payer: PPO

## 2017-10-16 DIAGNOSIS — E538 Deficiency of other specified B group vitamins: Secondary | ICD-10-CM | POA: Diagnosis not present

## 2017-10-16 MED ORDER — CYANOCOBALAMIN 1000 MCG/ML IJ SOLN
1000.0000 ug | Freq: Once | INTRAMUSCULAR | Status: AC
  Administered 2017-10-16: 1000 ug via INTRAMUSCULAR

## 2017-10-30 ENCOUNTER — Ambulatory Visit (INDEPENDENT_AMBULATORY_CARE_PROVIDER_SITE_OTHER): Payer: PPO | Admitting: Emergency Medicine

## 2017-10-30 DIAGNOSIS — E538 Deficiency of other specified B group vitamins: Secondary | ICD-10-CM

## 2017-10-30 MED ORDER — CYANOCOBALAMIN 1000 MCG/ML IJ SOLN
1000.0000 ug | Freq: Once | INTRAMUSCULAR | Status: AC
  Administered 2017-10-30: 1000 ug via INTRAMUSCULAR

## 2017-10-30 NOTE — Addendum Note (Signed)
Addended by: Ermalinda Barrios on: 10/30/2017 10:08 AM   Modules accepted: Level of Service

## 2017-11-13 ENCOUNTER — Ambulatory Visit (INDEPENDENT_AMBULATORY_CARE_PROVIDER_SITE_OTHER): Payer: PPO

## 2017-11-13 DIAGNOSIS — E538 Deficiency of other specified B group vitamins: Secondary | ICD-10-CM

## 2017-11-13 MED ORDER — CYANOCOBALAMIN 1000 MCG/ML IJ SOLN
1000.0000 ug | Freq: Once | INTRAMUSCULAR | Status: AC
  Administered 2017-11-13: 1000 ug via INTRAMUSCULAR

## 2017-11-27 ENCOUNTER — Ambulatory Visit (INDEPENDENT_AMBULATORY_CARE_PROVIDER_SITE_OTHER): Payer: PPO

## 2017-11-27 DIAGNOSIS — E538 Deficiency of other specified B group vitamins: Secondary | ICD-10-CM | POA: Diagnosis not present

## 2017-11-27 MED ORDER — CYANOCOBALAMIN 1000 MCG/ML IJ SOLN
1000.0000 ug | Freq: Once | INTRAMUSCULAR | Status: AC
  Administered 2017-11-27: 1000 ug via INTRAMUSCULAR

## 2017-12-11 ENCOUNTER — Ambulatory Visit (INDEPENDENT_AMBULATORY_CARE_PROVIDER_SITE_OTHER): Payer: PPO

## 2017-12-11 DIAGNOSIS — E538 Deficiency of other specified B group vitamins: Secondary | ICD-10-CM

## 2017-12-11 MED ORDER — CYANOCOBALAMIN 1000 MCG/ML IJ SOLN
1000.0000 ug | Freq: Once | INTRAMUSCULAR | Status: AC
Start: 2017-12-11 — End: 2017-12-11
  Administered 2017-12-11: 1000 ug via INTRAMUSCULAR

## 2017-12-11 NOTE — Progress Notes (Signed)
Administered with 25g 1in needle. Patient tolerated well. Scheduled appt for 2 weeks for next injection.

## 2017-12-25 ENCOUNTER — Ambulatory Visit (INDEPENDENT_AMBULATORY_CARE_PROVIDER_SITE_OTHER): Payer: PPO

## 2017-12-25 DIAGNOSIS — E538 Deficiency of other specified B group vitamins: Secondary | ICD-10-CM

## 2017-12-25 MED ORDER — CYANOCOBALAMIN 1000 MCG/ML IJ SOLN
1000.0000 ug | Freq: Once | INTRAMUSCULAR | Status: AC
  Administered 2017-12-25: 1000 ug via INTRAMUSCULAR

## 2018-01-08 ENCOUNTER — Ambulatory Visit (INDEPENDENT_AMBULATORY_CARE_PROVIDER_SITE_OTHER): Payer: PPO

## 2018-01-08 VITALS — BP 132/82 | HR 76 | Temp 98.0°F | Ht 70.0 in

## 2018-01-08 DIAGNOSIS — Z0000 Encounter for general adult medical examination without abnormal findings: Secondary | ICD-10-CM

## 2018-01-08 DIAGNOSIS — E538 Deficiency of other specified B group vitamins: Secondary | ICD-10-CM

## 2018-01-08 MED ORDER — CYANOCOBALAMIN 1000 MCG/ML IJ SOLN
1000.0000 ug | Freq: Once | INTRAMUSCULAR | Status: AC
  Administered 2018-01-08: 1000 ug via INTRAMUSCULAR

## 2018-01-08 NOTE — Patient Instructions (Addendum)
[name] , Thank you for taking time to come for your Medicare Wellness Visit. I appreciate your ongoing commitment to your health goals. Please review the following plan we discussed and let me know if I can assist you in the future.   Screening recommendations/referrals: Colonoscopy: Up to date Recommended yearly ophthalmology/optometry visit for glaucoma screening and checkup Recommended yearly dental visit for hygiene and checkup  Vaccinations: Influenza vaccine: Up to date Pneumococcal vaccine: Up to date Tdap vaccine: Up to date Shingles vaccine: Pt declines today.     Advanced directives: Please bring a copy of your POA (Power of Attorney) and/or Living Will to your next appointment.   Conditions/risks identified: Obesity- continue drinking 6-8 glasses of water a day and current diet regimen.   Next appointment: 02/25/18 @ 9:00 AM with Dr Rosanna Randy.  Preventive Care 68 Years and Older, Male Preventive care refers to lifestyle choices and visits with your health care provider that can promote health and wellness. What does preventive care include?  A yearly physical exam. This is also called an annual well check.  Dental exams once or twice a year.  Routine eye exams. Ask your health care provider how often you should have your eyes checked.  Personal lifestyle choices, including:  Daily care of your teeth and gums.  Regular physical activity.  Eating a healthy diet.  Avoiding tobacco and drug use.  Limiting alcohol use.  Practicing safe sex.  Taking low doses of aspirin every day.  Taking vitamin and mineral supplements as recommended by your health care provider. What happens during an annual well check? The services and screenings done by your health care provider during your annual well check will depend on your age, overall health, lifestyle risk factors, and family history of disease. Counseling  Your health care provider may ask you questions about  your:  Alcohol use.  Tobacco use.  Drug use.  Emotional well-being.  Home and relationship well-being.  Sexual activity.  Eating habits.  History of falls.  Memory and ability to understand (cognition).  Work and work Statistician. Screening  You may have the following tests or measurements:  Height, weight, and BMI.  Blood pressure.  Lipid and cholesterol levels. These may be checked every 5 years, or more frequently if you are over 68 years old.  Skin check.  Lung cancer screening. You may have this screening every year starting at age 68 if you have a 30-pack-year history of smoking and currently smoke or have quit within the past 15 years.  Fecal occult blood test (FOBT) of the stool. You may have this test every year starting at age 68.  Flexible sigmoidoscopy or colonoscopy. You may have a sigmoidoscopy every 5 years or a colonoscopy every 10 years starting at age 68.  Prostate cancer screening. Recommendations will vary depending on your family history and other risks.  Hepatitis C blood test.  Hepatitis B blood test.  Sexually transmitted disease (STD) testing.  Diabetes screening. This is done by checking your blood sugar (glucose) after you have not eaten for a while (fasting). You may have this done every 1-3 years.  Abdominal aortic aneurysm (AAA) screening. You may need this if you are a current or former smoker.  Osteoporosis. You may be screened starting at age 68 if you are at high risk. Talk with your health care provider about your test results, treatment options, and if necessary, the need for more tests. Vaccines  Your health care provider may recommend certain  vaccines, such as:  Influenza vaccine. This is recommended every year.  Tetanus, diphtheria, and acellular pertussis (Tdap, Td) vaccine. You may need a Td booster every 10 years.  Zoster vaccine. You may need this after age 68.  Pneumococcal 13-valent conjugate (PCV13) vaccine.  One dose is recommended after age 68.  Pneumococcal polysaccharide (PPSV23) vaccine. One dose is recommended after age 68. Talk to your health care provider about which screenings and vaccines you need and how often you need them. This information is not intended to replace advice given to you by your health care provider. Make sure you discuss any questions you have with your health care provider. Document Released: 11/11/2015 Document Revised: 07/04/2016 Document Reviewed: 08/16/2015 Elsevier Interactive Patient Education  2017 [employer] Prevention in the Home Falls can cause injuries. They can happen to people of all ages. There are many things you can do to make your home safe and to help prevent falls. What can I do on the outside of my home?  Regularly fix the edges of walkways and driveways and fix any cracks.  Remove anything that might make you trip as you walk through a door, such as a raised step or threshold.  Trim any bushes or trees on the path to your home.  Use bright outdoor lighting.  Clear any walking paths of anything that might make someone trip, such as rocks or tools.  Regularly check to see if handrails are loose or broken. Make sure that both sides of any steps have handrails.  Any raised decks and porches should have guardrails on the edges.  Have any leaves, snow, or ice cleared regularly.  Use sand or salt on walking paths during winter.  Clean up any spills in your garage right away. This includes oil or grease spills. What can I do in the bathroom?  Use night lights.  Install grab bars by the toilet and in the tub and shower. Do not use towel bars as grab bars.  Use non-skid mats or decals in the tub or shower.  If you need to sit down in the shower, use a plastic, non-slip stool.  Keep the floor dry. Clean up any water that spills on the floor as soon as it happens.  Remove soap buildup in the tub or shower regularly.  Attach bath  mats securely with double-sided non-slip rug tape.  Do not have throw rugs and other things on the floor that can make you trip. What can I do in the bedroom?  Use night lights.  Make sure that you have a light by your bed that is easy to reach.  Do not use any sheets or blankets that are too big for your bed. They should not hang down onto the floor.  Have a firm chair that has side arms. You can use this for support while you get dressed.  Do not have throw rugs and other things on the floor that can make you trip. What can I do in the kitchen?  Clean up any spills right away.  Avoid walking on wet floors.  Keep items that you use a lot in easy-to-reach places.  If you need to reach something above you, use a strong step stool that has a grab bar.  Keep electrical cords out of the way.  Do not use floor polish or wax that makes floors slippery. If you must use wax, use non-skid floor wax.  Do not have throw rugs and other things  on the floor that can make you trip. What can I do with my stairs?  Do not leave any items on the stairs.  Make sure that there are handrails on both sides of the stairs and use them. Fix handrails that are broken or loose. Make sure that handrails are as long as the stairways.  Check any carpeting to make sure that it is firmly attached to the stairs. Fix any carpet that is loose or worn.  Avoid having throw rugs at the top or bottom of the stairs. If you do have throw rugs, attach them to the floor with carpet tape.  Make sure that you have a light switch at the top of the stairs and the bottom of the stairs. If you do not have them, ask someone to add them for you. What else can I do to help prevent falls?  Wear shoes that:  Do not have high heels.  Have rubber bottoms.  Are comfortable and fit you well.  Are closed at the toe. Do not wear sandals.  If you use a stepladder:  Make sure that it is fully opened. Do not climb a closed  stepladder.  Make sure that both sides of the stepladder are locked into place.  Ask someone to hold it for you, if possible.  Clearly mark and make sure that you can see:  Any grab bars or handrails.  First and last steps.  Where the edge of each step is.  Use tools that help you move around (mobility aids) if they are needed. These include:  Canes.  Walkers.  Scooters.  Crutches.  Turn on the lights when you go into a dark area. Replace any light bulbs as soon as they burn out.  Set up your furniture so you have a clear path. Avoid moving your furniture around.  If any of your floors are uneven, fix them.  If there are any pets around you, be aware of where they are.  Review your medicines with your doctor. Some medicines can make you feel dizzy. This can increase your chance of falling. Ask your doctor what other things that you can do to help prevent falls. This information is not intended to replace advice given to you by your health care provider. Make sure you discuss any questions you have with your health care provider. Document Released: 08/11/2009 Document Revised: 03/22/2016 Document Reviewed: 11/19/2014 Elsevier Interactive Patient Education  2017 [employer].

## 2018-01-08 NOTE — Progress Notes (Signed)
Subjective:   Kenneth Brown is a 67 y.o. male who presents for Medicare Annual/Subsequent preventive examination.  Review of Systems:  N/A  Cardiac Risk Factors include: advanced age (>28men, >55 women);diabetes mellitus;male gender;hypertension;obesity (BMI >30kg/m2)     Objective:    Vitals: BP 132/82 (BP Location: Left Arm)   Pulse 76   Temp 98 F (36.7 C) (Oral)   Ht 5\' 10"  (1.778 m)   BMI 33.58 kg/m   Body mass index is 33.58 kg/m.  Advanced Directives 01/08/2018 02/19/2017 06/14/2016 01/24/2016 08/26/2015 07/08/2015 05/20/2015  Does Patient Have a Medical Advance Directive? Yes Yes Yes Yes No Yes Yes  Type of Paramedic of [place];Living will Living will [place];Living will [place];Living will - [place]  Does patient want to make changes to medical advance directive? - (No Data) - - - - No - Patient declined  Copy of [place] in Chart? No - copy requested - - - - Yes Yes  Would patient like information on creating a medical advance directive? - - - - No - patient declined information - -    Tobacco Social History   Tobacco Use  Smoking Status Never Smoker  Smokeless Tobacco Never Used     Counseling given: Not Answered   Clinical Intake:  Pre-visit preparation completed: Yes  Pain : No/denies pain Pain Score: 0-No pain     Nutritional Status: BMI > 30  Obese Nutritional Risks: Nausea/ vomitting/ diarrhea(diarrhea intermittenly due to recent virus) Diabetes: Yes(type 2) CBG done?: No Did pt. bring in CBG monitor from home?: No  How often do you need to have someone help you when you read instructions, pamphlets, or other written materials from your doctor or pharmacy?: 1 - Never  Interpreter Needed?: No  Information entered by :: [facility], LPN  Past Medical History:  Diagnosis Date  . Acute cystitis    . Diabetes mellitus without complication ([place])    pre diabetic  . Difficulty urinating   . Gross hematuria   . Platelets decreased ([place])   . Renal calculus, bilateral   . UTI (urinary tract infection) 10/2014   Past Surgical History:  Procedure Laterality Date  . ANKLE SURGERY  08/28/12  . COLONOSCOPY WITH PROPOFOL N/A 07/08/2015   Procedure: COLONOSCOPY WITH PROPOFOL;  Surgeon: Lucilla Lame, MD;  Location: [place];  Service: Endoscopy;  Laterality: N/A;  . ESOPHAGOGASTRODUODENOSCOPY (EGD) WITH PROPOFOL N/A 07/08/2015   Procedure: ESOPHAGOGASTRODUODENOSCOPY (EGD) WITH PROPOFOL;  Surgeon: Lucilla Lame, MD;  Location: [place];  Service: Endoscopy;  Laterality: N/A;  . ORIF ANKLE FRACTURE Right    plate and screws, pt still has  . POLYPECTOMY  07/08/2015   Procedure: POLYPECTOMY;  Surgeon: Lucilla Lame, MD;  Location: [place];  Service: Endoscopy;;  . URETEROSCOPY WITH HOLMIUM LASER LITHOTRIPSY Right 03/21/2015   Procedure: URETEROSCOPY WITH HOLMIUM LASER LITHOTRIPSY,retrograde pyelogram,stent placement;  Surgeon: Collier Flowers, MD;  Location: [facility];  Service: Urology;  Laterality: Right;   Family History  Problem Relation Age of Onset  . Hypertension Mother   . Heart disease Mother   . Stroke Mother   . COPD Mother   . Heart disease Father   . Heart attack Father   . Lung cancer Father   . Skin cancer Father    Social History   Socioeconomic History  . Marital status: Married    Spouse  name: None  . Number of children: 1  . Years of education: None  . Highest education level: Some college, no degree  Social Needs  . Financial resource strain: Not hard at all  . Food insecurity - worry: Never true  . Food insecurity - inability: Never true  . Transportation needs - medical: No  . Transportation needs - non-medical: No  Occupational History  . Occupation: part time Architect  Tobacco Use  . Smoking status: Never Smoker  . Smokeless  tobacco: Never Used  Substance and Sexual Activity  . Alcohol use: Yes    Alcohol/week: 0.0 - 0.6 oz    Comment: 1-2 drinks a month  . Drug use: No  . Sexual activity: None  Other Topics Concern  . None  Social History Narrative  . None    Outpatient Encounter Medications as of 01/08/2018  Medication Sig  . acetaminophen (TYLENOL) 500 MG tablet Take by mouth.   . levothyroxine (SYNTHROID, LEVOTHROID) 50 MCG tablet Take 1 tablet (50 mcg total) by mouth daily.  Marland Kitchen losartan (COZAAR) 25 MG tablet TAKE 1 TABLET BY MOUTH DAILY  . naproxen sodium (ALEVE) 220 MG tablet Take 220 mg by mouth daily as needed.  Marland Kitchen Phenylephrine-DM-GG-APAP (MUCINEX FAST-MAX COLD FLU) 5-10-200-325 MG/10ML LIQD Take by mouth as needed.  . vitamin C (ASCORBIC ACID) 500 MG tablet Take 500 mg by mouth daily.   No facility-administered encounter medications on file as of 01/08/2018.     Activities of Daily Living In your present state of health, do you have any difficulty performing the following activities: 01/08/2018 02/19/2017  Hearing? N N  Vision? N N  Difficulty concentrating or making decisions? N N  Walking or climbing stairs? N N  Dressing or bathing? N N  Doing errands, shopping? N N  Preparing Food and eating ? N N  Using the Toilet? N N  In the past six months, have you accidently leaked urine? N N  Do you have problems with loss of bowel control? N N  Managing your Medications? N N  Managing your Finances? N N  Housekeeping or managing your Housekeeping? N N  Some recent data might be hidden    Patient Care Team: Jerrol Banana., MD as PCP - General (Family Medicine) Lucilla Lame, MD as Consulting Physician (Gastroenterology) Lorelee Cover., MD as Consulting Physician (Ophthalmology) Diona Browner Paulino Door, MD as Referring Physician (Orthopedic Surgery)   Assessment:   This is a routine wellness examination for [name].  Exercise Activities and Dietary recommendations Current Exercise  Habits: The patient has a physically strenous job, but has no regular exercise apart from work.  Goals    . DIET - INCREASE WATER INTAKE     Continue drinking 6-8 glasses of water a day and current diet regimen.        Fall Risk Fall Risk  01/08/2018 02/19/2017 01/24/2016  Falls in the past year? No No No   Is the patient's home free of loose throw rugs in walkways, pet beds, electrical cords, etc?   yes      Grab bars in the bathroom? yes      Handrails on the stairs?   no      Adequate lighting?   yes  Timed Get Up and Go Performed: N/A  Depression Screen PHQ 2/9 Scores 01/08/2018 02/19/2017 01/24/2016  PHQ - 2 Score 0 0 0    Cognitive Function: Pt declined screening today.     6CIT Screen  02/19/2017  What Year? 0 points  What month? 0 points  What time? 0 points  Count back from 20 0 points  Months in reverse 0 points  Repeat phrase 0 points  Total Score 0    Immunization History  Administered Date(s) Administered  . Influenza, High Dose Seasonal PF 10/03/2016, 08/26/2017  . Pneumococcal Conjugate-13 01/24/2016  . Pneumococcal Polysaccharide-23 02/19/2017  . Tdap 05/20/2013  . Zoster 05/20/2013    Qualifies for Shingles Vaccine? Due for Shingles vaccine. Declined my offer to administer today. Education has been provided regarding the importance of this vaccine. Pt has been advised to call her insurance company to determine her out of pocket expense. Advised she may also receive this vaccine at her local pharmacy or Health Dept. Verbalized acceptance and understanding.  Screening Tests Health Maintenance  Topic Date Due  . OPHTHALMOLOGY EXAM  11/29/2016  . HEMOGLOBIN A1C  03/04/2018  . FOOT EXAM  05/28/2018  . COLONOSCOPY  07/07/2018  . TETANUS/TDAP  05/21/2023  . INFLUENZA VACCINE  Completed  . Hepatitis C Screening  Completed  . PNA vac Low Risk Adult  Completed   Cancer Screenings: Lung: Low Dose CT Chest recommended if Age 66-80 years, 30 pack-year  currently smoking OR have quit w/in 15years. Patient does not qualify. Colorectal: Up to date  Additional Screenings:  Hepatitis C Screening: Up to date    Plan:  I have personally reviewed and addressed the Medicare Annual Wellness questionnaire and have noted the following in the patient's chart:  A. Medical and social history B. Use of alcohol, tobacco or illicit drugs  C. Current medications and supplements D. Functional ability and status E.  Nutritional status F.  Physical activity G. Advance directives H. List of other physicians I.  Hospitalizations, surgeries, and ER visits in previous 12 months J.  [place] such as hearing and vision if needed, cognitive and depression L. Referrals and appointments - none  In addition, I have reviewed and discussed with patient certain preventive protocols, quality metrics, and best practice recommendations. A written personalized care plan for preventive services as well as general preventive health recommendations were provided to patient.  See attached scanned questionnaire for additional information.   Signed,  Fabio Neighbors, LPN Nurse Health Advisor   Nurse Recommendations: Pt to schedule an eye exam this spring.

## 2018-01-22 ENCOUNTER — Ambulatory Visit (INDEPENDENT_AMBULATORY_CARE_PROVIDER_SITE_OTHER): Payer: PPO

## 2018-01-22 DIAGNOSIS — E538 Deficiency of other specified B group vitamins: Secondary | ICD-10-CM

## 2018-01-22 MED ORDER — CYANOCOBALAMIN 1000 MCG/ML IJ SOLN
1000.0000 ug | Freq: Once | INTRAMUSCULAR | Status: AC
  Administered 2018-01-22: 1000 ug via INTRAMUSCULAR

## 2018-02-05 ENCOUNTER — Ambulatory Visit (INDEPENDENT_AMBULATORY_CARE_PROVIDER_SITE_OTHER): Payer: PPO

## 2018-02-05 DIAGNOSIS — E538 Deficiency of other specified B group vitamins: Secondary | ICD-10-CM

## 2018-02-05 MED ORDER — CYANOCOBALAMIN 1000 MCG/ML IJ SOLN
1000.0000 ug | Freq: Once | INTRAMUSCULAR | Status: AC
  Administered 2018-02-05: 1000 ug via INTRAMUSCULAR

## 2018-02-06 DIAGNOSIS — M25871 Other specified joint disorders, right ankle and foot: Secondary | ICD-10-CM | POA: Diagnosis not present

## 2018-02-06 DIAGNOSIS — Z96661 Presence of right artificial ankle joint: Secondary | ICD-10-CM | POA: Diagnosis not present

## 2018-02-06 DIAGNOSIS — M12571 Traumatic arthropathy, right ankle and foot: Secondary | ICD-10-CM | POA: Diagnosis not present

## 2018-02-06 DIAGNOSIS — M85671 Other cyst of bone, right ankle and foot: Secondary | ICD-10-CM | POA: Diagnosis not present

## 2018-02-17 DIAGNOSIS — M25871 Other specified joint disorders, right ankle and foot: Secondary | ICD-10-CM | POA: Diagnosis not present

## 2018-02-18 ENCOUNTER — Encounter: Payer: Self-pay | Admitting: Family Medicine

## 2018-02-18 ENCOUNTER — Ambulatory Visit (INDEPENDENT_AMBULATORY_CARE_PROVIDER_SITE_OTHER): Payer: PPO | Admitting: Family Medicine

## 2018-02-18 DIAGNOSIS — E538 Deficiency of other specified B group vitamins: Secondary | ICD-10-CM

## 2018-02-18 MED ORDER — CYANOCOBALAMIN 1000 MCG/ML IJ SOLN
1000.0000 ug | Freq: Once | INTRAMUSCULAR | Status: AC
  Administered 2018-02-18: 1000 ug via INTRAMUSCULAR

## 2018-02-18 NOTE — Progress Notes (Signed)
Vitamin B 12 injection given in right deltoid with 25g 1in needle.  Patient tolerated well. 1. B12 deficiency Keep follow up appointment with Dr. Rosanna Randy on 02-25-18. - cyanocobalamin ((VITAMIN B-12)) injection 1,000 mcg

## 2018-02-19 ENCOUNTER — Ambulatory Visit: Payer: PPO

## 2018-02-20 ENCOUNTER — Ambulatory Visit: Payer: Self-pay

## 2018-02-20 DIAGNOSIS — M85671 Other cyst of bone, right ankle and foot: Secondary | ICD-10-CM | POA: Diagnosis not present

## 2018-02-20 DIAGNOSIS — M12571 Traumatic arthropathy, right ankle and foot: Secondary | ICD-10-CM | POA: Diagnosis not present

## 2018-02-20 DIAGNOSIS — Z96661 Presence of right artificial ankle joint: Secondary | ICD-10-CM | POA: Diagnosis not present

## 2018-02-20 DIAGNOSIS — M25871 Other specified joint disorders, right ankle and foot: Secondary | ICD-10-CM | POA: Diagnosis not present

## 2018-02-25 ENCOUNTER — Encounter: Payer: Self-pay | Admitting: Family Medicine

## 2018-02-25 ENCOUNTER — Ambulatory Visit (INDEPENDENT_AMBULATORY_CARE_PROVIDER_SITE_OTHER): Payer: PPO | Admitting: Family Medicine

## 2018-02-25 VITALS — BP 140/82 | HR 84 | Temp 98.8°F | Resp 16 | Wt 236.6 lb

## 2018-02-25 DIAGNOSIS — Z0001 Encounter for general adult medical examination with abnormal findings: Secondary | ICD-10-CM | POA: Diagnosis not present

## 2018-02-25 DIAGNOSIS — R059 Cough, unspecified: Secondary | ICD-10-CM

## 2018-02-25 DIAGNOSIS — I10 Essential (primary) hypertension: Secondary | ICD-10-CM

## 2018-02-25 DIAGNOSIS — R05 Cough: Secondary | ICD-10-CM | POA: Diagnosis not present

## 2018-02-25 DIAGNOSIS — E785 Hyperlipidemia, unspecified: Secondary | ICD-10-CM | POA: Diagnosis not present

## 2018-02-25 DIAGNOSIS — E119 Type 2 diabetes mellitus without complications: Secondary | ICD-10-CM | POA: Diagnosis not present

## 2018-02-25 DIAGNOSIS — J42 Unspecified chronic bronchitis: Secondary | ICD-10-CM

## 2018-02-25 DIAGNOSIS — Z0000 Encounter for general adult medical examination without abnormal findings: Secondary | ICD-10-CM

## 2018-02-25 MED ORDER — HYDROCODONE-HOMATROPINE 5-1.5 MG/5ML PO SYRP: 5.0000 mL | ORAL_SOLUTION | Freq: Three times a day (TID) | ORAL | 0 refills | Status: DC | PRN

## 2018-02-25 MED ORDER — AZITHROMYCIN 250 MG PO TABS: ORAL_TABLET | ORAL | 0 refills | Status: DC

## 2018-02-25 NOTE — Progress Notes (Signed)
Patient: Kenneth Brown, Male    DOB: 1950-12-03, 67 y.o.   MRN: [id number] Visit Date: 02/25/2018  Today's Provider: Wilhemena Durie, MD   Chief Complaint  Patient presents with  . Annual Exam   Subjective:     Complete Physical [name] is a 67 y.o. male. He feels poorly. He reports exercising walking daily. He reports he is sleeping poorly, patient reports for the past 2 weeks with URI symptoms he has not bene sleeping well.  -----------------------------------------------------------   Review of Systems  Constitutional: Negative.   HENT: Positive for congestion, rhinorrhea, sinus pressure, sneezing and sore throat.   Eyes: Negative.   Respiratory: Positive for cough and choking.        Cough for several weeks.  Cardiovascular: Negative.   Gastrointestinal: Positive for blood in stool.  Endocrine: Negative.   Genitourinary: Negative.   Musculoskeletal: Positive for arthralgias, back pain, myalgias and neck stiffness.  Allergic/Immunologic: Negative.   Neurological: Negative.   Hematological: Negative.   Psychiatric/Behavioral: Negative.     Social History   Socioeconomic History  . Marital status: Married    Spouse name: Not on file  . Number of children: 1  . Years of education: Not on file  . Highest education level: Some college, no degree  Occupational History  . Occupation: part time Architect  Social Needs  . Financial resource strain: Not hard at all  . Food insecurity:    Worry: Never true    Inability: Never true  . Transportation needs:    Medical: No    Non-medical: No  Tobacco Use  . Smoking status: Never Smoker  . Smokeless tobacco: Never Used  Substance and Sexual Activity  . Alcohol use: Yes    Alcohol/week: 0.0 - 0.6 oz    Comment: 1-2 drinks a month  . Drug use: No  . Sexual activity: Not on file  Lifestyle  . Physical activity:    Days per week: Not on file    Minutes per session: Not on file  .  Stress: Not at all  Relationships  . Social connections:    Talks on phone: Not on file    Gets together: Not on file    Attends religious service: Not on file    Active member of club or organization: Not on file    Attends meetings of clubs or organizations: Not on file    Relationship status: Not on file  . Intimate partner violence:    Fear of current or ex partner: Not on file    Emotionally abused: Not on file    Physically abused: Not on file    Forced sexual activity: Not on file  Other Topics Concern  . Not on file  Social History Narrative  . Not on file    Past Medical History:  Diagnosis Date  . Acute cystitis   . Diabetes mellitus without complication ([place])    pre diabetic  . Difficulty urinating   . Gross hematuria   . Platelets decreased ([place])   . Renal calculus, bilateral   . UTI (urinary tract infection) 10/2014     Patient Active Problem List   Diagnosis Date Noted  . Bone cyst 08/29/2017  . 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 Primary localized osteoarthrosis of ankle and foot 05/28/2017  . B12 deficiency 05/07/2015  . Splenomegaly 05/06/2015  . Thrombocytopenia ([place]) 04/17/2015  . Abnormal liver enzymes 03/02/2015  . Allergic rhinitis  03/02/2015  . Blood pressure elevated 03/02/2015  . Borderline diabetes 03/02/2015  . History of colon polyps 03/02/2015  . Personal history of traumatic fracture 03/02/2015  . H/O disease 03/02/2015  . H/O renal calculi 03/02/2015  . Personal history of infectious and parasitic disease 03/02/2015  . HLD (hyperlipidemia) 03/02/2015  . Diabetes mellitus, type 2 ([place]) 03/02/2015  . Adiposity 03/02/2015  . Arthritis 10/12/2014    Past Surgical History:  Procedure Laterality Date  . ANKLE SURGERY  08/28/12  . COLONOSCOPY WITH PROPOFOL N/A 07/08/2015   Procedure: COLONOSCOPY WITH PROPOFOL;  Surgeon: Lucilla Lame, MD;  Location: [facility];  Service: Endoscopy;  Laterality: N/A;  .  ESOPHAGOGASTRODUODENOSCOPY (EGD) WITH PROPOFOL N/A 07/08/2015   Procedure: ESOPHAGOGASTRODUODENOSCOPY (EGD) WITH PROPOFOL;  Surgeon: Lucilla Lame, MD;  Location: [facility];  Service: Endoscopy;  Laterality: N/A;  . ORIF ANKLE FRACTURE Right    plate and screws, pt still has  . POLYPECTOMY  07/08/2015   Procedure: POLYPECTOMY;  Surgeon: Lucilla Lame, MD;  Location: [facility];  Service: Endoscopy;;  . URETEROSCOPY WITH HOLMIUM LASER LITHOTRIPSY Right 03/21/2015   Procedure: URETEROSCOPY WITH HOLMIUM LASER LITHOTRIPSY,retrograde pyelogram,stent placement;  Surgeon: Collier Flowers, MD;  Location: [facility];  Service: Urology;  Laterality: Right;    His family history includes COPD in his mother; Heart attack in his father; Heart disease in his father and mother; Hypertension in his mother; Lung cancer in his father; Skin cancer in his father; Stroke in his mother.      Current Outpatient Medications:  .  acetaminophen (TYLENOL) 500 MG tablet, Take by mouth. , Disp: , Rfl:  .  levothyroxine (SYNTHROID, LEVOTHROID) 50 MCG tablet, Take 1 tablet (50 mcg total) by mouth daily., Disp: 90 tablet, Rfl: 3 .  losartan (COZAAR) 25 MG tablet, TAKE 1 TABLET BY MOUTH DAILY, Disp: 30 tablet, Rfl: 11 .  meloxicam (MOBIC) 7.5 MG tablet, meloxicam 7.5 mg tablet  Take 1 tablet every day by oral route., Disp: , Rfl:  .  naproxen sodium (ALEVE) 220 MG tablet, Take 220 mg by mouth daily as needed., Disp: , Rfl:  .  vitamin C (ASCORBIC ACID) 500 MG tablet, Take 500 mg by mouth daily., Disp: , Rfl:   Patient Care Team: Jerrol Banana., MD as PCP - General (Family Medicine) Lucilla Lame, MD as Consulting Physician (Gastroenterology) Lorelee Cover., MD as Consulting Physician (Ophthalmology) Diona Browner Paulino Door, MD as Referring Physician (Orthopedic Surgery)     Objective:   Vitals: BP 140/82   Pulse 84   Temp 98.8 F (37.1 C) (Oral)   Resp 16   Wt 236 lb 9.6 oz (107.3 kg)   SpO2 93%   BMI  33.95 kg/m   Physical Exam  Constitutional: He is oriented to person, place, and time. He appears well-developed and well-nourished.  HENT:  Head: Normocephalic and atraumatic.  Right Ear: External ear normal.  Left Ear: External ear normal.  Nose: Nose normal.  Mouth/Throat: Oropharynx is clear and moist.  Eyes: Pupils are equal, round, and reactive to light. Conjunctivae are normal. No scleral icterus.  Neck: No thyromegaly present.  Cardiovascular: Normal rate, regular rhythm, normal heart sounds and intact distal pulses.  Pulmonary/Chest: Effort normal and breath sounds normal.  Abdominal: Soft.  Lymphadenopathy:    He has no cervical adenopathy.  Neurological: He is alert and oriented to person, place, and time.  Skin: Skin is warm and dry.  Psychiatric: He has a normal mood  and affect. His behavior is normal. Judgment and thought content normal.    Activities of Daily Living In your present state of health, do you have any difficulty performing the following activities: 02/25/2018 01/08/2018  Hearing? N N  Vision? N N  Difficulty concentrating or making decisions? N N  Walking or climbing stairs? N N  Dressing or bathing? N N  Doing errands, shopping? N N  Preparing Food and eating ? - N  Using the Toilet? - N  In the past six months, have you accidently leaked urine? - N  Do you have problems with loss of bowel control? - N  Managing your Medications? - N  Managing your Finances? - N  Housekeeping or managing your Housekeeping? - N  Some recent data might be hidden    Fall Risk Assessment Fall Risk  02/25/2018 01/08/2018 02/19/2017 01/24/2016  Falls in the past year? No No No No     Depression Screen PHQ 2/9 Scores 02/25/2018 01/08/2018 02/19/2017 01/24/2016  PHQ - 2 Score 1 0 0 0      Assessment & Plan:    Annual Physical Reviewed patient's Family Medical History Reviewed and updated list of patient's medical providers Assessment of cognitive impairment was  done Assessed patient's functional ability Established a written schedule for health screening [place] Completed and Reviewed  Exercise Activities and Dietary recommendations Goals    . DIET - INCREASE WATER INTAKE     Continue drinking 6-8 glasses of water a day and current diet regimen.        Immunization History  Administered Date(s) Administered  . Influenza, High Dose Seasonal PF 10/03/2016, 08/26/2017  . Influenza,inj,quad, With Preservative 07/29/2016  . Pneumococcal Conjugate-13 01/24/2016  . Pneumococcal Polysaccharide-23 02/19/2017  . Tdap 05/20/2013  . Zoster 05/20/2013    Health Maintenance  Topic Date Due  . HEMOGLOBIN A1C  03/04/2018  . FOOT EXAM  05/28/2018  . INFLUENZA VACCINE  05/29/2018  . OPHTHALMOLOGY EXAM  06/20/2018  . COLONOSCOPY  07/07/2018  . TETANUS/TDAP  05/21/2023  . Hepatitis C Screening  Completed  . PNA vac Low Risk Adult  Completed     Discussed health benefits of physical activity, and encouraged him to engage in regular exercise appropriate for his age and condition.  Chronic Bronchitis/cough Zpak and hycodan. B12 deficiency Stop b12 shots TIIDM Obesity Thrombocytopenia Possible OSA   ------------------------------------------------------------------------------------------------------------   I have done the exam and reviewed the above chart and it is accurate to the best of my knowledge. Development worker, community has been used in this note in any air is in the dictation or transcription are unintentional.  Wilhemena Durie, MD  [facility]

## 2018-02-26 LAB — COMPREHENSIVE METABOLIC PANEL
ALBUMIN: 4.3 g/dL (ref 3.6–4.8)
ALK PHOS: 72 IU/L (ref 39–117)
ALT: 16 IU/L (ref 0–44)
AST: 21 IU/L (ref 0–40)
Albumin/Globulin Ratio: 2 (ref 1.2–2.2)
BUN / CREAT RATIO: 12 (ref 10–24)
BUN: 12 mg/dL (ref 8–27)
Bilirubin Total: 1.6 mg/dL — ABNORMAL HIGH (ref 0.0–1.2)
CO2: 21 mmol/L (ref 20–29)
Calcium: 9.1 mg/dL (ref 8.6–10.2)
Chloride: 100 mmol/L (ref 96–106)
Creatinine, Ser: 1.04 mg/dL (ref 0.76–1.27)
GFR calc Af Amer: 85 mL/min/{1.73_m2} (ref >59)
GFR, EST NON AFRICAN AMERICAN: 73 mL/min/{1.73_m2} (ref >59)
GLOBULIN, TOTAL: 2.2 g/dL (ref 1.5–4.5)
GLUCOSE: 118 mg/dL — AB (ref 65–99)
Potassium: 4.2 mmol/L (ref 3.5–5.2)
SODIUM: 137 mmol/L (ref 134–144)
Total Protein: 6.5 g/dL (ref 6.0–8.5)

## 2018-02-26 LAB — LIPID PANEL
CHOLESTEROL TOTAL: 139 mg/dL (ref 100–199)
Chol/HDL Ratio: 4.2 ratio (ref 0.0–5.0)
HDL: 33 mg/dL — AB (ref >39)
LDL CALC: 76 mg/dL (ref 0–99)
Triglycerides: 151 mg/dL — ABNORMAL HIGH (ref 0–149)
VLDL CHOLESTEROL CAL: 30 mg/dL (ref 5–40)

## 2018-02-26 LAB — TSH: TSH: 2.94 u[IU]/mL (ref 0.450–4.500)

## 2018-03-18 ENCOUNTER — Telehealth: Payer: Self-pay

## 2018-03-18 DIAGNOSIS — D696 Thrombocytopenia, unspecified: Secondary | ICD-10-CM

## 2018-03-18 NOTE — Telephone Encounter (Signed)
Patient is in the  office today with his sick wife and had questions about his labs. He stated he viewed them on Mychart and wasn't able to understand what it all meant. I kindly explain to him about his labs that were drawn. He was concerned that about his platelets and wanted to know the results. No CBC was ordered and was unable to give results. He states he had them done at a lab where we requested results from but there was no labs scanned into epic. Patient is wanting CBC order and also want to know if we have labs in office to compare them to. Please advise?

## 2018-03-27 ENCOUNTER — Other Ambulatory Visit: Payer: Self-pay

## 2018-03-27 DIAGNOSIS — D696 Thrombocytopenia, unspecified: Secondary | ICD-10-CM

## 2018-03-27 NOTE — Addendum Note (Signed)
Addended by: Althea Charon D on: 03/27/2018 11:22 AM   Modules accepted: Orders

## 2018-03-27 NOTE — Telephone Encounter (Signed)
Done  ED 

## 2018-03-27 NOTE — Telephone Encounter (Signed)
Ok to order CBC °

## 2018-04-03 DIAGNOSIS — M85671 Other cyst of bone, right ankle and foot: Secondary | ICD-10-CM | POA: Diagnosis not present

## 2018-04-03 DIAGNOSIS — M12571 Traumatic arthropathy, right ankle and foot: Secondary | ICD-10-CM | POA: Diagnosis not present

## 2018-04-03 DIAGNOSIS — D696 Thrombocytopenia, unspecified: Secondary | ICD-10-CM | POA: Diagnosis not present

## 2018-04-03 DIAGNOSIS — M25871 Other specified joint disorders, right ankle and foot: Secondary | ICD-10-CM | POA: Diagnosis not present

## 2018-04-03 DIAGNOSIS — Z96661 Presence of right artificial ankle joint: Secondary | ICD-10-CM | POA: Diagnosis not present

## 2018-04-04 LAB — CBC WITH DIFFERENTIAL/PLATELET
BASOS: 0 %
Basophils Absolute: 0 10*3/uL (ref 0.0–0.2)
EOS (ABSOLUTE): 0.2 10*3/uL (ref 0.0–0.4)
EOS: 4 %
HEMATOCRIT: 46.5 % (ref 37.5–51.0)
HEMOGLOBIN: 15.4 g/dL (ref 13.0–17.7)
IMMATURE GRANS (ABS): 0 10*3/uL (ref 0.0–0.1)
IMMATURE GRANULOCYTES: 0 %
LYMPHS: 22 %
Lymphocytes Absolute: 1.1 10*3/uL (ref 0.7–3.1)
MCH: 29.7 pg (ref 26.6–33.0)
MCHC: 33.1 g/dL (ref 31.5–35.7)
MCV: 90 fL (ref 79–97)
Monocytes Absolute: 0.1 10*3/uL (ref 0.1–0.9)
Monocytes: 3 %
NEUTROS PCT: 71 %
Neutrophils Absolute: 3.6 10*3/uL (ref 1.4–7.0)
Platelets: 79 10*3/uL — CL (ref 150–450)
RBC: 5.18 x10E6/uL (ref 4.14–5.80)
RDW: 16.1 % — ABNORMAL HIGH (ref 12.3–15.4)
WBC: 5 10*3/uL (ref 3.4–10.8)

## 2018-04-14 ENCOUNTER — Other Ambulatory Visit: Payer: Self-pay | Admitting: Family Medicine

## 2018-05-29 ENCOUNTER — Ambulatory Visit (INDEPENDENT_AMBULATORY_CARE_PROVIDER_SITE_OTHER): Payer: PPO | Admitting: Family Medicine

## 2018-05-29 VITALS — BP 150/84 | HR 74 | Temp 98.7°F | Resp 16 | Wt 236.0 lb

## 2018-05-29 DIAGNOSIS — E6609 Other obesity due to excess calories: Secondary | ICD-10-CM | POA: Diagnosis not present

## 2018-05-29 DIAGNOSIS — R7303 Prediabetes: Secondary | ICD-10-CM

## 2018-05-29 DIAGNOSIS — M1990 Unspecified osteoarthritis, unspecified site: Secondary | ICD-10-CM

## 2018-05-29 DIAGNOSIS — Z6833 Body mass index (BMI) 33.0-33.9, adult: Secondary | ICD-10-CM

## 2018-05-29 LAB — POCT GLYCOSYLATED HEMOGLOBIN (HGB A1C): Hemoglobin A1C: 5.9 % — AB (ref 4.0–5.6)

## 2018-05-29 NOTE — Progress Notes (Signed)
Kenneth Brown  MRN: [id number] DOB: 19-Dec-1950  Subjective:  HPI   The patient is a 67 year old male who presents today for follow up after being taken off of his B12 injections.  He states he has been taking them for several years.   Pre-diabetes-the patient had his last A1C on 09/04/17 and it was 5.5.  He states when he checks his glucose hit has been running about 100-115.  Patient Active Problem List   Diagnosis Date Noted  . Bone cyst 08/29/2017  . 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 Primary localized osteoarthrosis of ankle and foot 05/28/2017  . B12 deficiency 05/07/2015  . Splenomegaly 05/06/2015  . Thrombocytopenia ([place]) 04/17/2015  . Abnormal liver enzymes 03/02/2015  . Allergic rhinitis 03/02/2015  . Blood pressure elevated 03/02/2015  . Borderline diabetes 03/02/2015  . History of colon polyps 03/02/2015  . Personal history of traumatic fracture 03/02/2015  . H/O disease 03/02/2015  . H/O renal calculi 03/02/2015  . Personal history of infectious and parasitic disease 03/02/2015  . HLD (hyperlipidemia) 03/02/2015  . Diabetes mellitus, type 2 ([place]) 03/02/2015  . Adiposity 03/02/2015  . Arthritis 10/12/2014    Past Medical History:  Diagnosis Date  . Acute cystitis   . Diabetes mellitus without complication ([place])    pre diabetic  . Difficulty urinating   . Gross hematuria   . Platelets decreased ([place])   . Renal calculus, bilateral   . UTI (urinary tract infection) 10/2014    Social History   Socioeconomic History  . Marital status: Married    Spouse name: Not on file  . Number of children: 1  . Years of education: Not on file  . Highest education level: Some college, no degree  Occupational History  . Occupation: part time Architect  Social Needs  . Financial resource strain: Not hard at all  . Food insecurity:    Worry: Never true    Inability: Never true  . Transportation needs:    Medical: No    Non-medical: No  Tobacco Use    . Smoking status: Never Smoker  . Smokeless tobacco: Never Used  Substance and Sexual Activity  . Alcohol use: Yes    Alcohol/week: 0.0 - 0.6 oz    Comment: 1-2 drinks a month  . Drug use: No  . Sexual activity: Not on file  Lifestyle  . Physical activity:    Days per week: Not on file    Minutes per session: Not on file  . Stress: Not at all  Relationships  . Social connections:    Talks on phone: Not on file    Gets together: Not on file    Attends religious service: Not on file    Active member of club or organization: Not on file    Attends meetings of clubs or organizations: Not on file    Relationship status: Not on file  . Intimate partner violence:    Fear of current or ex partner: Not on file    Emotionally abused: Not on file    Physically abused: Not on file    Forced sexual activity: Not on file  Other Topics Concern  . Not on file  Social History Narrative  . Not on file    Outpatient Encounter Medications as of 05/29/2018  Medication Sig Note  . acetaminophen (TYLENOL) 500 MG tablet Take by mouth.  08/13/2016: prn  . levothyroxine (SYNTHROID, LEVOTHROID) 50 MCG tablet Take 1 tablet (50 mcg  total) by mouth daily.   Marland Kitchen losartan (COZAAR) 25 MG tablet TAKE 1 TABLET BY MOUTH DAILY   . meloxicam (MOBIC) 7.5 MG tablet TAKE ONE TABLET BY MOUTH EVERY DAY   . vitamin C (ASCORBIC ACID) 500 MG tablet Take 500 mg by mouth daily.   . [DISCONTINUED] azithromycin (ZITHROMAX) 250 MG tablet Take as directed. Take two tablets the first day, than one pill daily   . [DISCONTINUED] HYDROcodone-homatropine (HYCODAN) 5-1.5 MG/5ML syrup Take 5 mLs by mouth every 8 (eight) hours as needed for cough.   . [DISCONTINUED] naproxen sodium (ALEVE) 220 MG tablet Take 220 mg by mouth daily as needed.    No facility-administered encounter medications on file as of 05/29/2018.     No Known Allergies  Review of Systems  Constitutional: Negative for fever and malaise/fatigue.  Eyes: Negative.    Respiratory: Negative for cough, shortness of breath and wheezing.   Cardiovascular: Negative for chest pain, palpitations, orthopnea, claudication and leg swelling.  Gastrointestinal: Negative.   Genitourinary: Negative for frequency.  Skin: Negative.   Neurological: Negative for dizziness and headaches.  Endo/Heme/Allergies: Negative for polydipsia.  Psychiatric/Behavioral: Negative.     Objective:  BP (!) 150/84 (BP Location: Right Arm, Patient Position: Sitting, Cuff Size: Normal)   Pulse 74   Temp 98.7 F (37.1 C) (Oral)   Resp 16   Wt 236 lb (107 kg)   BMI 33.86 kg/m   Physical Exam  Constitutional: He is oriented to person, place, and time and well-developed, well-nourished, and in no distress.  HENT:  Head: Normocephalic and atraumatic.  Eyes: Conjunctivae are normal. No scleral icterus.  Neck: No thyromegaly present.  Cardiovascular: Normal rate, regular rhythm and normal heart sounds.  Pulmonary/Chest: Effort normal and breath sounds normal.  Abdominal: Soft.  Musculoskeletal: He exhibits no edema.  Neurological: He is alert and oriented to person, place, and time. Gait normal. GCS score is 15.  Skin: Skin is warm and dry.  Psychiatric: Mood, memory, affect and judgment normal.    Assessment and Plan :  1. Borderline diabetes  - POCT glycosylated hemoglobin (Hb A1C)--5.9 today 2.Obesity 3.Traumatic OA of ankle  I have done the exam and reviewed the chart and it is accurate to the best of my knowledge. Development worker, community has been used and  any errors in dictation or transcription are unintentional. Miguel Aschoff M.D. [facility]  --

## 2018-06-05 DIAGNOSIS — M25871 Other specified joint disorders, right ankle and foot: Secondary | ICD-10-CM | POA: Diagnosis not present

## 2018-06-05 DIAGNOSIS — M85671 Other cyst of bone, right ankle and foot: Secondary | ICD-10-CM | POA: Diagnosis not present

## 2018-06-05 DIAGNOSIS — Z96661 Presence of right artificial ankle joint: Secondary | ICD-10-CM | POA: Diagnosis not present

## 2018-06-05 DIAGNOSIS — M12571 Traumatic arthropathy, right ankle and foot: Secondary | ICD-10-CM | POA: Diagnosis not present

## 2018-06-17 ENCOUNTER — Other Ambulatory Visit: Payer: Self-pay | Admitting: Family Medicine

## 2018-06-17 NOTE — Telephone Encounter (Signed)
Pharmacy requesting refills.

## 2018-08-09 ENCOUNTER — Other Ambulatory Visit: Payer: Self-pay | Admitting: Family Medicine

## 2018-08-09 DIAGNOSIS — R7303 Prediabetes: Secondary | ICD-10-CM

## 2018-09-03 ENCOUNTER — Ambulatory Visit (INDEPENDENT_AMBULATORY_CARE_PROVIDER_SITE_OTHER): Payer: PPO | Admitting: Family Medicine

## 2018-09-03 VITALS — BP 120/76 | HR 82 | Temp 97.6°F | Resp 16 | Wt 233.0 lb

## 2018-09-03 DIAGNOSIS — Z23 Encounter for immunization: Secondary | ICD-10-CM | POA: Diagnosis not present

## 2018-09-03 DIAGNOSIS — Z01818 Encounter for other preprocedural examination: Secondary | ICD-10-CM

## 2018-09-03 NOTE — Progress Notes (Signed)
Kenneth Brown  MRN: [id number] DOB: Jul 31, 1951  Subjective:  HPI   The patient is a 67 year old male who presents for surgical clearance.  He is scheduled to have surgery on 09/10/18 at [facility].  They have presently listed; right ankle open debridement, possible polyethylene liner exchange vs possible hardware removal, cement spacers. The patient has never had any difficulty with anesthesia.  He is not on any blood thinners, Aspirin or NSAIDS.  He denies any heart or lung disease.    Patient Active Problem List   Diagnosis Date Noted  . Bone cyst 08/29/2017  . 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 Primary localized osteoarthrosis of ankle and foot 05/28/2017  . B12 deficiency 05/07/2015  . Splenomegaly 05/06/2015  . Thrombocytopenia ([place]) 04/17/2015  . Abnormal liver enzymes 03/02/2015  . Allergic rhinitis 03/02/2015  . Blood pressure elevated 03/02/2015  . Borderline diabetes 03/02/2015  . History of colon polyps 03/02/2015  . Personal history of traumatic fracture 03/02/2015  . H/O disease 03/02/2015  . H/O renal calculi 03/02/2015  . Personal history of infectious and parasitic disease 03/02/2015  . HLD (hyperlipidemia) 03/02/2015  . Diabetes mellitus, type 2 ([place]) 03/02/2015  . Adiposity 03/02/2015  . Arthritis 10/12/2014    Past Medical History:  Diagnosis Date  . Acute cystitis   . Diabetes mellitus without complication ([place])    pre diabetic  . Difficulty urinating   . Gross hematuria   . Platelets decreased ([place])   . Renal calculus, bilateral   . UTI (urinary tract infection) 10/2014    Social History   Socioeconomic History  . Marital status: Married    Spouse name: Not on file  . Number of children: 1  . Years of education: Not on file  . Highest education level: Some college, no degree  Occupational History  . Occupation: part time Architect  Social Needs  . Financial resource strain: Not hard at all  . Food insecurity:     Worry: Never true    Inability: Never true  . Transportation needs:    Medical: No    Non-medical: No  Tobacco Use  . Smoking status: Never Smoker  . Smokeless tobacco: Never Used  Substance and Sexual Activity  . Alcohol use: Yes    Alcohol/week: 0.0 - 1.0 standard drinks    Comment: 1-2 drinks a month  . Drug use: No  . Sexual activity: Not on file  Lifestyle  . Physical activity:    Days per week: Not on file    Minutes per session: Not on file  . Stress: Not at all  Relationships  . Social connections:    Talks on phone: Not on file    Gets together: Not on file    Attends religious service: Not on file    Active member of club or organization: Not on file    Attends meetings of clubs or organizations: Not on file    Relationship status: Not on file  . Intimate partner violence:    Fear of current or ex partner: Not on file    Emotionally abused: Not on file    Physically abused: Not on file    Forced sexual activity: Not on file  Other Topics Concern  . Not on file  Social History Narrative  . Not on file    Outpatient Encounter Medications as of 09/03/2018  Medication Sig Note  . acetaminophen (TYLENOL) 500 MG tablet Take by mouth.  08/13/2016:  prn  . levothyroxine (SYNTHROID, LEVOTHROID) 50 MCG tablet TAKE 1 TABLET BY MOUTH ONCE DAILY   . losartan (COZAAR) 25 MG tablet TAKE 1 TABLET BY MOUTH ONCE DAILY   . vitamin B-12 (CYANOCOBALAMIN) 1000 MCG tablet Take 1,000 mcg by mouth daily.   . meloxicam (MOBIC) 7.5 MG tablet TAKE ONE TABLET BY MOUTH EVERY DAY (Patient not taking: Reported on 09/03/2018)   . vitamin C (ASCORBIC ACID) 500 MG tablet Take 500 mg by mouth daily.    No facility-administered encounter medications on file as of 09/03/2018.     No Known Allergies  Review of Systems  Constitutional: Negative for fever and malaise/fatigue.  Eyes: Negative.   Respiratory: Negative for cough, shortness of breath and wheezing.   Cardiovascular: Negative for  chest pain, palpitations, orthopnea, claudication and leg swelling.  Gastrointestinal: Negative.   Skin: Negative.   Neurological: Negative for dizziness and headaches.  Endo/Heme/Allergies: Negative.   Psychiatric/Behavioral: Negative.     Objective:  BP 120/76 (BP Location: Right Arm, Patient Position: Sitting, Cuff Size: Normal)   Pulse 82   Temp 97.6 F (36.4 C) (Oral)   Resp 16   Wt 233 lb (105.7 kg)   SpO2 97%   BMI 33.43 kg/m   Physical Exam  Constitutional: He is oriented to person, place, and time and well-developed, well-nourished, and in no distress.  HENT:  Head: Normocephalic and atraumatic.  Right Ear: External ear normal.  Left Ear: External ear normal.  Nose: Nose normal.  Eyes: Conjunctivae are normal. No scleral icterus.  Neck: No thyromegaly present.  Cardiovascular: Normal rate, regular rhythm and normal heart sounds.  Pulmonary/Chest: Effort normal and breath sounds normal.  Abdominal: Soft.  Musculoskeletal: He exhibits no edema.  Neurological: He is alert and oriented to person, place, and time. Gait normal. GCS score is 15.  Skin: Skin is warm and dry.  Psychiatric: Mood, memory, affect and judgment normal.    Assessment and Plan :   1. Pre-op exam Pt cleared for ankle surgery. - EKG 12-Lead  2. Need for influenza vaccination  - Flu vaccine HIGH DOSE PF (Fluzone High dose)  HPI, Exam and A&P Transcribed under the direction and in the presence of Wilhemena Durie., MD. Electronically Signed: Althea Charon, RMA  I have done the exam and reviewed the chart and it is accurate to the best of my knowledge. Development worker, community has been used and  any errors in dictation or transcription are unintentional. Miguel Aschoff M.D. [facility]

## 2018-09-10 DIAGNOSIS — D519 Vitamin B12 deficiency anemia, unspecified: Secondary | ICD-10-CM | POA: Diagnosis not present

## 2018-09-10 DIAGNOSIS — D696 Thrombocytopenia, unspecified: Secondary | ICD-10-CM | POA: Diagnosis not present

## 2018-09-10 DIAGNOSIS — Z87442 Personal history of urinary calculi: Secondary | ICD-10-CM | POA: Diagnosis not present

## 2018-09-10 DIAGNOSIS — Z87820 Personal history of traumatic brain injury: Secondary | ICD-10-CM | POA: Diagnosis not present

## 2018-09-10 DIAGNOSIS — D732 Chronic congestive splenomegaly: Secondary | ICD-10-CM | POA: Diagnosis not present

## 2018-09-10 DIAGNOSIS — M25571 Pain in right ankle and joints of right foot: Secondary | ICD-10-CM | POA: Diagnosis not present

## 2018-09-10 DIAGNOSIS — M85671 Other cyst of bone, right ankle and foot: Secondary | ICD-10-CM | POA: Diagnosis not present

## 2018-09-10 DIAGNOSIS — I10 Essential (primary) hypertension: Secondary | ICD-10-CM | POA: Diagnosis not present

## 2018-09-10 DIAGNOSIS — E039 Hypothyroidism, unspecified: Secondary | ICD-10-CM | POA: Diagnosis not present

## 2018-09-10 DIAGNOSIS — T8484XA Pain due to internal orthopedic prosthetic devices, implants and grafts, initial encounter: Secondary | ICD-10-CM | POA: Diagnosis not present

## 2018-09-10 DIAGNOSIS — S8265XD Nondisplaced fracture of lateral malleolus of left fibula, subsequent encounter for closed fracture with routine healing: Secondary | ICD-10-CM | POA: Diagnosis not present

## 2018-09-10 DIAGNOSIS — M24671 Ankylosis, right ankle: Secondary | ICD-10-CM | POA: Diagnosis not present

## 2018-09-10 DIAGNOSIS — E669 Obesity, unspecified: Secondary | ICD-10-CM | POA: Diagnosis not present

## 2018-09-10 DIAGNOSIS — M12571 Traumatic arthropathy, right ankle and foot: Secondary | ICD-10-CM | POA: Diagnosis not present

## 2018-09-10 DIAGNOSIS — M25871 Other specified joint disorders, right ankle and foot: Secondary | ICD-10-CM | POA: Diagnosis not present

## 2018-09-10 DIAGNOSIS — Z6833 Body mass index (BMI) 33.0-33.9, adult: Secondary | ICD-10-CM | POA: Diagnosis not present

## 2018-09-10 DIAGNOSIS — E538 Deficiency of other specified B group vitamins: Secondary | ICD-10-CM | POA: Diagnosis not present

## 2018-09-10 DIAGNOSIS — G8918 Other acute postprocedural pain: Secondary | ICD-10-CM | POA: Diagnosis not present

## 2018-09-16 ENCOUNTER — Other Ambulatory Visit: Payer: Self-pay

## 2018-09-16 NOTE — Patient Outreach (Signed)
[facility]) Care Management  09/16/2018  Kenneth Brown 05-17-1951 [id number]   Referral received. No outreach warranted at this time. Transition of Care  will be completed by primary care provider office who will refer to [facility] care management if needed.  Plan: RN CM will close case.  Jone Baseman, RN, MSN [facility] Care Management Coordinator Direct Line [phone] Cell [phone] Toll Free: [phone]  Fax: [phone]

## 2018-09-22 ENCOUNTER — Ambulatory Visit: Payer: Self-pay | Admitting: Pharmacist

## 2018-09-22 NOTE — Chronic Care Management (AMB) (Signed)
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   Objective: Lab Results  Component Value Date   CREATININE 1.04 02/25/2018   CREATININE 1.01 02/20/2017   CREATININE 1.11 08/13/2016    Lab Results  Component Value Date   HGBA1C 5.9 (A) 05/29/2018    Lipid Panel     Component Value Date/Time   CHOL 139 02/25/2018 1030   TRIG 151 (H) 02/25/2018 1030   HDL 33 (L) 02/25/2018 1030   CHOLHDL 4.2 02/25/2018 1030   LDLCALC 76 02/25/2018 1030    BP Readings from Last 3 Encounters:  09/03/18 120/76  05/29/18 (!) 150/84  02/25/18 140/82    No Known Allergies  Medications Reviewed Today    Reviewed by Cathi Roan, [place] (Pharmacist) on 09/22/18 at 1221  Med List Status: <None>  Medication Order Taking? Sig Documenting Provider Last Dose Status Informant  acetaminophen (TYLENOL) 500 MG tablet [id number] Yes Take by mouth.  [provider] Taking Active            Med Note Emmaline Kluver Aug 13, 2016 11:27 AM) prn  levothyroxine Wilmer Floor, LEVOTHROID) 50 MCG tablet [id number] Yes TAKE 1 TABLET BY MOUTH ONCE DAILY Jerrol Banana., MD Taking Active   losartan (COZAAR) 25 MG tablet [id number] Yes TAKE 1 TABLET BY MOUTH ONCE DAILY Jerrol Banana., MD Taking Active   meloxicam [facility]) 7.5 MG tablet [id number] No TAKE ONE TABLET BY MOUTH EVERY DAY  Patient not taking:  Reported on 09/03/2018   Jerrol Banana., MD Not Taking Active   ondansetron (ZOFRAN ODT) 4 MG disintegrating tablet [id number] No Place 4 mg under the tongue every 6 (six) hours as needed. [provider] Not  Taking Active            Med Note Kary Kos, Evelena Peat Sep 22, 2018 12:20 PM) Stopped about 2 days post surgery  oxyCODONE (OXY IR/ROXICODONE) 5 MG immediate release tablet [id number] No Take 1-2 tablets by mouth every 6 (six) hours as needed for pain. [provider] Not Taking Active            Med Note Kary Kos, Evelena Peat Sep 22, 2018 12:21 PM) Stopped about 2 days post surgery  vitamin B-12 (CYANOCOBALAMIN) 1000 MCG tablet [id number] Yes Take 1,000 mcg by mouth daily. [provider] Taking Active   vitamin C (ASCORBIC ACID) 500 MG tablet [id number] Yes Take 500 mg by mouth daily. [provider] Taking Active          ASSESSMENT: Date Discharged from Hospital: 09/09/18 Date Medication Reconciliation Performed: 09/22/2018   Medications Discontinued at Discharge:   Azithromycin 250mg   meloxicam 7.5mg    New Medications at Discharge:   Oxycodone 5mg  tablet, take 1-2 tablets every 4 to 6 hours by mouth as needed  Zofran ODT 4mg  tablet, take 1 tablet every 6 to 8 hours by mouth as needed   Patient was recently discharged from hospital and all medications have been reviewed.  Drugs sorted by system:  Cardiovascular: losartan 25mg    Gastrointestinal: ondansetron 4mg  ODT  Endocrine: levothyroxine 50 mcg  Pain: oxycodone 5mg  IR, APAP 500mg   Vitamins/Minerals/Supplements:  vitamin B12, vitamin C   Medication Review Findings:  . Oxycodone/geriatric age    PLAN: -Instructed patient to take new medications as prescribed and discontinue old medications as prescribed   Patient has follow up appointment at surgery scheduled for tomorrow, and a follow up primary care appointment scheduled for December 3.   Ruben Reason, PharmD Clinical Pharmacist [place] [phone]

## 2018-09-23 DIAGNOSIS — M12571 Traumatic arthropathy, right ankle and foot: Secondary | ICD-10-CM | POA: Diagnosis not present

## 2018-09-23 DIAGNOSIS — M85671 Other cyst of bone, right ankle and foot: Secondary | ICD-10-CM | POA: Diagnosis not present

## 2018-09-23 DIAGNOSIS — M25871 Other specified joint disorders, right ankle and foot: Secondary | ICD-10-CM | POA: Diagnosis not present

## 2018-09-30 ENCOUNTER — Ambulatory Visit (INDEPENDENT_AMBULATORY_CARE_PROVIDER_SITE_OTHER): Payer: PPO | Admitting: Family Medicine

## 2018-09-30 ENCOUNTER — Encounter: Payer: Self-pay | Admitting: Family Medicine

## 2018-09-30 VITALS — BP 107/71 | HR 82 | Temp 98.5°F | Resp 16 | Wt 232.0 lb

## 2018-09-30 DIAGNOSIS — D696 Thrombocytopenia, unspecified: Secondary | ICD-10-CM

## 2018-09-30 DIAGNOSIS — R161 Splenomegaly, not elsewhere classified: Secondary | ICD-10-CM

## 2018-09-30 DIAGNOSIS — R7303 Prediabetes: Secondary | ICD-10-CM | POA: Diagnosis not present

## 2018-09-30 DIAGNOSIS — E119 Type 2 diabetes mellitus without complications: Secondary | ICD-10-CM

## 2018-09-30 DIAGNOSIS — E6609 Other obesity due to excess calories: Secondary | ICD-10-CM | POA: Diagnosis not present

## 2018-09-30 DIAGNOSIS — Z6833 Body mass index (BMI) 33.0-33.9, adult: Secondary | ICD-10-CM | POA: Diagnosis not present

## 2018-09-30 DIAGNOSIS — M19079 Primary osteoarthritis, unspecified ankle and foot: Secondary | ICD-10-CM

## 2018-09-30 LAB — POCT GLYCOSYLATED HEMOGLOBIN (HGB A1C): Hemoglobin A1C: 5.9 % — AB (ref 4.0–5.6)

## 2018-09-30 NOTE — Progress Notes (Signed)
Patient: Kenneth Brown Male    DOB: May 04, 1951   67 y.o.   MRN: [id number] Visit Date: 09/30/2018  Today's Provider: Wilhemena Durie, MD   Chief Complaint  Patient presents with  . Hyperglycemia  . Abnormal Lab    Patient reports that platlet count has been low over the past several months and is requesting to have labs drawn today.    Subjective:    HPI He has had recent ankle surgery and done fairly well with this.  No exercise.  He is concerned about his platelet count which is been stable for several years.  Having no excess bleeding.  He has not been checking his blood sugars at home.     [place] [facility]  Follow-up:   Lab Results  Component Value Date   HGBA1C 5.9 (A) 05/29/2018   HGBA1C 5.5 09/04/2017   HGBA1C 5.3 05/28/2017    Last seen for diabetes 4 months ago.  Management since then includes none, patient states that he is working on his diet but cannont actively exercise at this time due to his foot. Patient denies symptoms of polydipsia, polyuria or visual changes.     Most Recent Eye Exam: 11/2017 patient reports Weight trend: stable Prior visit with dietician: no Current diet: well balanced Current exercise: none  Pertinent Labs:    Component Value Date/Time   CHOL 139 02/25/2018 1030   TRIG 151 (H) 02/25/2018 1030   HDL 33 (L) 02/25/2018 1030   LDLCALC 76 02/25/2018 1030   CREATININE 1.04 02/25/2018 1030   CREATININE 1.26 08/30/2012 0342    Wt Readings from Last 3 Encounters:  09/30/18 232 lb (105.2 kg)  09/03/18 233 lb (105.7 kg)  05/29/18 236 lb (107 kg)    ------------------------------------------------------------------------  Abnormal Lab Patient would like to address today abnormal platelet count, patient states that this is a ongoing issue and would like to discuss today.  No Known Allergies   Current Outpatient Medications:  .  acetaminophen (TYLENOL) 500 MG tablet, Take by mouth. , Disp: , Rfl:  .   levothyroxine (SYNTHROID, LEVOTHROID) 50 MCG tablet, TAKE 1 TABLET BY MOUTH ONCE DAILY, Disp: 90 tablet, Rfl: 3 .  losartan (COZAAR) 25 MG tablet, TAKE 1 TABLET BY MOUTH ONCE DAILY, Disp: 30 tablet, Rfl: 11 .  oxyCODONE (OXY IR/ROXICODONE) 5 MG immediate release tablet, Take 1-2 tablets by mouth every 6 (six) hours as needed for pain., Disp: , Rfl:  .  vitamin B-12 (CYANOCOBALAMIN) 1000 MCG tablet, Take 1,000 mcg by mouth daily., Disp: , Rfl:  .  vitamin C (ASCORBIC ACID) 500 MG tablet, Take 500 mg by mouth daily., Disp: , Rfl:   Review of Systems  Constitutional: Negative.   HENT: Negative.   Eyes: Negative.   Respiratory: Negative.   Cardiovascular: Negative.   Musculoskeletal: Positive for arthralgias.  Allergic/Immunologic: Negative.   Neurological: Negative.   Hematological: Negative.   Psychiatric/Behavioral: Negative.     Social History   Tobacco Use  . Smoking status: Never Smoker  . Smokeless tobacco: Never Used  Substance Use Topics  . Alcohol use: Yes    Alcohol/week: 0.0 - 1.0 standard drinks    Comment: 1-2 drinks a month   Objective:   BP 107/71   Pulse 82   Temp 98.5 F (36.9 C) (Oral)   Resp 16   Wt 232 lb (105.2 kg)   SpO2 98%   BMI 33.29 kg/m  Vitals:   09/30/18 7681  BP: 107/71  Pulse: 82  Resp: 16  Temp: 98.5 F (36.9 C)  TempSrc: Oral  SpO2: 98%  Weight: 232 lb (105.2 kg)     Physical Exam  Constitutional: He is oriented to person, place, and time. He appears well-developed and well-nourished.  Obese white male in no acute distress  HENT:  Head: Normocephalic and atraumatic.  Right Ear: External ear normal.  Left Ear: External ear normal.  Nose: Nose normal.  Eyes: Conjunctivae are normal. No scleral icterus.  Neck: No thyromegaly present.  Cardiovascular: Normal rate, regular rhythm and normal heart sounds.  Pulmonary/Chest: Effort normal and breath sounds normal.  Abdominal: Soft.  Musculoskeletal: He exhibits no edema.    Lymphadenopathy:    He has no cervical adenopathy.  Neurological: He is alert and oriented to person, place, and time.  Skin: Skin is warm and dry.  Psychiatric: He has a normal mood and affect. His behavior is normal. Judgment and thought content normal.        Assessment & Plan:     1. Borderline diabetes Good control. - POCT glycosylated hemoglobin (Hb A1C)--5.9   2. Thrombocytopenia (HCC) Stable.  He has seen hematology for this and does not wish to go back. - CBC with Differential/Platelet  3. Type 2 diabetes mellitus without complication, unspecified whether long term insulin use ([place])   4. Localized, primary osteoarthritis of ankle or foot, unspecified laterality Patient had recent reconstructive surgery.  5. Splenomegaly   6. Class 1 obesity due to excess calories with serious comorbidity and body mass index (BMI) of 33.0 to 33.9 in adult [place] to continue to try to lose weight.      Patient seen and examined by Dr. Miguel Aschoff, note scribed by Jennings Books, [place], MD  [place]

## 2018-10-01 ENCOUNTER — Telehealth: Payer: Self-pay

## 2018-10-01 LAB — CBC WITH DIFFERENTIAL/PLATELET
Basophils Absolute: 0.1 10*3/uL (ref 0.0–0.2)
Basos: 1 %
EOS (ABSOLUTE): 0.3 10*3/uL (ref 0.0–0.4)
EOS: 3 %
Hematocrit: 46.4 % (ref 37.5–51.0)
Hemoglobin: 15.4 g/dL (ref 13.0–17.7)
Immature Grans (Abs): 0.1 10*3/uL (ref 0.0–0.1)
Immature Granulocytes: 1 %
Lymphocytes Absolute: 1.2 10*3/uL (ref 0.7–3.1)
Lymphs: 12 %
MCH: 29.3 pg (ref 26.6–33.0)
MCHC: 33.2 g/dL (ref 31.5–35.7)
MCV: 88 fL (ref 79–97)
Monocytes Absolute: 0.6 10*3/uL (ref 0.1–0.9)
Monocytes: 7 %
Neutrophils Absolute: 7.2 10*3/uL — ABNORMAL HIGH (ref 1.4–7.0)
Neutrophils: 76 %
Platelets: 139 10*3/uL — ABNORMAL LOW (ref 150–450)
RBC: 5.26 x10E6/uL (ref 4.14–5.80)
RDW: 14.3 % (ref 12.3–15.4)
WBC: 9.4 10*3/uL (ref 3.4–10.8)

## 2018-10-01 NOTE — Telephone Encounter (Signed)
-----   Message from Jerrol Banana., MD sent at 10/01/2018  9:25 AM EST ----- Platelets better.

## 2018-10-01 NOTE — Telephone Encounter (Signed)
Left message to call back  

## 2018-10-01 NOTE — Telephone Encounter (Signed)
Advised patient wife of results.

## 2018-11-06 DIAGNOSIS — M25871 Other specified joint disorders, right ankle and foot: Secondary | ICD-10-CM | POA: Diagnosis not present

## 2018-11-06 DIAGNOSIS — M85671 Other cyst of bone, right ankle and foot: Secondary | ICD-10-CM | POA: Diagnosis not present

## 2018-11-06 DIAGNOSIS — M12571 Traumatic arthropathy, right ankle and foot: Secondary | ICD-10-CM | POA: Diagnosis not present

## 2018-11-17 ENCOUNTER — Telehealth: Payer: Self-pay

## 2018-11-17 DIAGNOSIS — Z20828 Contact with and (suspected) exposure to other viral communicable diseases: Secondary | ICD-10-CM

## 2018-11-17 MED ORDER — OSELTAMIVIR PHOSPHATE 75 MG PO CAPS: 75.0000 mg | ORAL_CAPSULE | Freq: Every day | ORAL | 0 refills | Status: DC

## 2018-11-17 NOTE — Telephone Encounter (Signed)
Patient and his wife are patient's of Dr Marlan Palau.  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  She was diagnosed with Flu B this morning and treated with Tamiflu.  Patient wants to know if they need to be treated with Tamiflu prophylactic?  Call back # is [phone] They use [facility]

## 2018-11-17 NOTE — Telephone Encounter (Signed)
Patient was advised.  

## 2018-11-17 NOTE — Telephone Encounter (Signed)
Sent in

## 2018-11-24 DIAGNOSIS — M5416 Radiculopathy, lumbar region: Secondary | ICD-10-CM | POA: Diagnosis not present

## 2018-12-01 DIAGNOSIS — M6281 Muscle weakness (generalized): Secondary | ICD-10-CM | POA: Diagnosis not present

## 2018-12-01 DIAGNOSIS — M545 Low back pain: Secondary | ICD-10-CM | POA: Diagnosis not present

## 2018-12-04 DIAGNOSIS — M6281 Muscle weakness (generalized): Secondary | ICD-10-CM | POA: Diagnosis not present

## 2018-12-04 DIAGNOSIS — M545 Low back pain: Secondary | ICD-10-CM | POA: Diagnosis not present

## 2018-12-09 DIAGNOSIS — M545 Low back pain: Secondary | ICD-10-CM | POA: Diagnosis not present

## 2018-12-09 DIAGNOSIS — M6281 Muscle weakness (generalized): Secondary | ICD-10-CM | POA: Diagnosis not present

## 2018-12-11 DIAGNOSIS — M545 Low back pain: Secondary | ICD-10-CM | POA: Diagnosis not present

## 2018-12-11 DIAGNOSIS — M6281 Muscle weakness (generalized): Secondary | ICD-10-CM | POA: Diagnosis not present

## 2018-12-15 DIAGNOSIS — M545 Low back pain: Secondary | ICD-10-CM | POA: Diagnosis not present

## 2018-12-15 DIAGNOSIS — M6281 Muscle weakness (generalized): Secondary | ICD-10-CM | POA: Diagnosis not present

## 2018-12-22 ENCOUNTER — Ambulatory Visit (INDEPENDENT_AMBULATORY_CARE_PROVIDER_SITE_OTHER): Payer: PPO | Admitting: Family Medicine

## 2018-12-22 ENCOUNTER — Encounter: Payer: Self-pay | Admitting: Family Medicine

## 2018-12-22 VITALS — BP 118/70 | HR 81 | Temp 98.5°F | Resp 16 | Wt 243.0 lb

## 2018-12-22 DIAGNOSIS — J069 Acute upper respiratory infection, unspecified: Secondary | ICD-10-CM | POA: Diagnosis not present

## 2018-12-22 MED ORDER — HYDROCODONE-HOMATROPINE 5-1.5 MG/5ML PO SYRP: ORAL_SOLUTION | ORAL | 0 refills | Status: DC

## 2018-12-22 NOTE — Progress Notes (Signed)
  Subjective:     Patient ID: Kenneth Brown, male   DOB: May 18, 1951, 68 y.o.   MRN: [id number] Chief Complaint  Patient presents with  . Sore Throat    Patient comes in of[place]ce today with concerns of congestion, sore throat and cough for the past 24hrs.    HPI He has been exposed to his wife who has been ill and grandchildren.  Review of Systems     Objective:   Physical Exam Constitutional:      General: He is not in acute distress.    Appearance: He is not ill-appearing.   Ears: T.M's intact without inflammation Throat: no tonsillar enlargement or exudate; moderate erythema Neck: no cervical adenopathy Lungs: clear     Assessment:    1. URI, acute - HYDROcodone-homatropine (HYCODAN) 5-1.5 MG/5ML syrup; 5 ml 4-6 hours as needed for cough  Dispense: 100 mL; Refill: 0    Plan:    Discussed use of Mucinex D and Delsym.

## 2018-12-22 NOTE — Patient Instructions (Signed)
Discussed use of Mucinex D. May also use Delsym for cough.

## 2018-12-25 ENCOUNTER — Telehealth: Payer: Self-pay | Admitting: Family Medicine

## 2018-12-25 ENCOUNTER — Other Ambulatory Visit: Payer: Self-pay | Admitting: Family Medicine

## 2018-12-25 MED ORDER — HYDROCOD POLST-CPM POLST ER 10-8 MG/5ML PO SUER: 5.0000 mL | Freq: Two times a day (BID) | ORAL | 0 refills | Status: DC | PRN

## 2018-12-25 NOTE — Telephone Encounter (Signed)
If not taking Mucinex D start this. If he is taking this, add a nasal decongestant spray for 3 days. This is still a viral infection causing his miseries.

## 2018-12-25 NOTE — Telephone Encounter (Signed)
Discussed with patient to add nasal decongestant spray and will call in Tussionex as Hycodan not effective.

## 2018-12-25 NOTE — Telephone Encounter (Signed)
Wife states that patient started Mucinex D as advised at his visit and has been on it since Monday, wife states that patient is also using nasal spray and has had no relief. KW

## 2018-12-25 NOTE — Telephone Encounter (Signed)
Pt was in Monday and seen Bob.  He is feeling worse and needs an antibiotic.  His wife said that they were suppose to call back if he wasn't feeling better  He uses [facility]  CB# [phone]  Thanks Con [employer]

## 2018-12-25 NOTE — Telephone Encounter (Signed)
Please call and see what specific sx he is having particularly fever. He is only 4 days out from his cold sx.

## 2018-12-25 NOTE — Telephone Encounter (Signed)
Wife reports that patient is complaining of cough productive of mucous, chest congestion, difficulty sleeping at night even though he is taking Hycodan and sinus pressure. KW

## 2018-12-25 NOTE — Telephone Encounter (Signed)
Kenneth Brown could you please review message and advise if appropriate

## 2019-01-13 ENCOUNTER — Ambulatory Visit: Payer: Self-pay

## 2019-01-13 ENCOUNTER — Other Ambulatory Visit: Payer: Self-pay | Admitting: Family Medicine

## 2019-01-13 DIAGNOSIS — R7303 Prediabetes: Secondary | ICD-10-CM

## 2019-01-13 MED ORDER — LOSARTAN POTASSIUM 25 MG PO TABS: 25.0000 mg | ORAL_TABLET | Freq: Every day | ORAL | 11 refills | Status: DC

## 2019-01-13 NOTE — Telephone Encounter (Signed)
Pt is requesting 90 day refills on: losartan (COZAAR) 25 MG tablet     Please fill at: [facility] [place] [address] [place] (Phone) [phone] (Fax)   Thanks, [employer]

## 2019-01-26 ENCOUNTER — Ambulatory Visit: Payer: PPO

## 2019-03-02 DIAGNOSIS — M5416 Radiculopathy, lumbar region: Secondary | ICD-10-CM | POA: Insufficient documentation

## 2019-03-06 DIAGNOSIS — M5416 Radiculopathy, lumbar region: Secondary | ICD-10-CM | POA: Diagnosis not present

## 2019-03-06 DIAGNOSIS — M5136 Other intervertebral disc degeneration, lumbar region: Secondary | ICD-10-CM | POA: Diagnosis not present

## 2019-03-07 DIAGNOSIS — M5416 Radiculopathy, lumbar region: Secondary | ICD-10-CM | POA: Diagnosis not present

## 2019-03-10 DIAGNOSIS — M48061 Spinal stenosis, lumbar region without neurogenic claudication: Secondary | ICD-10-CM | POA: Diagnosis not present

## 2019-03-18 DIAGNOSIS — M5416 Radiculopathy, lumbar region: Secondary | ICD-10-CM | POA: Diagnosis not present

## 2019-03-31 DIAGNOSIS — M5416 Radiculopathy, lumbar region: Secondary | ICD-10-CM | POA: Diagnosis not present

## 2019-04-02 ENCOUNTER — Ambulatory Visit: Payer: Self-pay | Admitting: Family Medicine

## 2019-05-28 ENCOUNTER — Telehealth: Payer: Self-pay

## 2019-05-28 NOTE — Telephone Encounter (Signed)
LMTCB to schedule telephonic AWV. 

## 2019-06-01 ENCOUNTER — Other Ambulatory Visit: Payer: Self-pay

## 2019-06-10 NOTE — Telephone Encounter (Signed)
Spoke with wife who states she will speak with husband regarding setting this apt up and will CB tomorrow.

## 2019-06-18 DIAGNOSIS — M25671 Stiffness of right ankle, not elsewhere classified: Secondary | ICD-10-CM | POA: Diagnosis not present

## 2019-06-29 ENCOUNTER — Other Ambulatory Visit: Payer: Self-pay | Admitting: Family Medicine

## 2019-07-13 NOTE — Telephone Encounter (Signed)
Unable to reach pt and there was no CB. Closing encounter.

## 2021-01-31 ENCOUNTER — Ambulatory Visit (INDEPENDENT_AMBULATORY_CARE_PROVIDER_SITE_OTHER): Payer: PPO | Admitting: Nurse Practitioner

## 2021-01-31 ENCOUNTER — Other Ambulatory Visit: Payer: Self-pay

## 2021-01-31 ENCOUNTER — Encounter: Payer: Self-pay | Admitting: Nurse Practitioner

## 2021-01-31 VITALS — BP 146/83 | HR 60 | Temp 98.7°F | Ht 69.0 in | Wt 237.7 lb

## 2021-01-31 DIAGNOSIS — R030 Elevated blood-pressure reading, without diagnosis of hypertension: Secondary | ICD-10-CM

## 2021-01-31 DIAGNOSIS — H9313 Tinnitus, bilateral: Secondary | ICD-10-CM | POA: Diagnosis not present

## 2021-01-31 DIAGNOSIS — Z86010 Personal history of colonic polyps: Secondary | ICD-10-CM | POA: Diagnosis not present

## 2021-01-31 DIAGNOSIS — Z7689 Persons encountering health services in other specified circumstances: Secondary | ICD-10-CM | POA: Diagnosis not present

## 2021-01-31 DIAGNOSIS — Z1211 Encounter for screening for malignant neoplasm of colon: Secondary | ICD-10-CM | POA: Insufficient documentation

## 2021-01-31 DIAGNOSIS — E538 Deficiency of other specified B group vitamins: Secondary | ICD-10-CM

## 2021-01-31 NOTE — Patient Instructions (Signed)
https://www.nhlbi.nih.gov/files/docs/public/heart/dash_brief.pdf">  DASH Eating Plan DASH stands for Dietary Approaches to Stop Hypertension. The DASH eating plan is a healthy eating plan that has been shown to:  Reduce high blood pressure (hypertension).  Reduce your risk for type 2 diabetes, heart disease, and stroke.  Help with weight loss. What are tips for following this plan? Reading food labels  Check food labels for the amount of salt (sodium) per serving. Choose foods with less than 5 percent of the Daily Value of sodium. Generally, foods with less than 300 milligrams (mg) of sodium per serving fit into this eating plan.  To find whole grains, look for the word "whole" as the first word in the ingredient list. Shopping  Buy products labeled as "low-sodium" or "no salt added."  Buy fresh foods. Avoid canned foods and pre-made or frozen meals. Cooking  Avoid adding salt when cooking. Use salt-free seasonings or herbs instead of table salt or sea salt. Check with your health care provider or pharmacist before using salt substitutes.  Do not fry foods. Cook foods using healthy methods such as baking, boiling, grilling, roasting, and broiling instead.  Cook with heart-healthy oils, such as olive, canola, avocado, soybean, or sunflower oil. Meal planning  Eat a balanced diet that includes: ? 4 or more servings of fruits and 4 or more servings of vegetables each day. Try to fill one-half of your plate with fruits and vegetables. ? 6-8 servings of whole grains each day. ? Less than 6 oz (170 g) of lean meat, poultry, or fish each day. A 3-oz (85-g) serving of meat is about the same size as a deck of cards. One egg equals 1 oz (28 g). ? 2-3 servings of low-fat dairy each day. One serving is 1 cup (237 mL). ? 1 serving of nuts, seeds, or beans 5 times each week. ? 2-3 servings of heart-healthy fats. Healthy fats called omega-3 fatty acids are found in foods such as walnuts,  flaxseeds, fortified milks, and eggs. These fats are also found in cold-water fish, such as sardines, salmon, and mackerel.  Limit how much you eat of: ? Canned or prepackaged foods. ? Food that is high in trans fat, such as some fried foods. ? Food that is high in saturated fat, such as fatty meat. ? Desserts and other sweets, sugary drinks, and other foods with added sugar. ? Full-fat dairy products.  Do not salt foods before eating.  Do not eat more than 4 egg yolks a week.  Try to eat at least 2 vegetarian meals a week.  Eat more home-cooked food and less restaurant, buffet, and fast food.   Lifestyle  When eating at a restaurant, ask that your food be prepared with less salt or no salt, if possible.  If you drink alcohol: ? Limit how much you use to:  0-1 drink a day for women who are not pregnant.  0-2 drinks a day for men. ? Be aware of how much alcohol is in your drink. In the [place] one drink equals one 12 oz bottle of beer (355 mL), one 5 oz glass of wine (148 mL), or one 1 oz glass of hard liquor (44 mL). General information  Avoid eating more than 2,300 mg of salt a day. If you have hypertension, you may need to reduce your sodium intake to 1,500 mg a day.  Work with your health care provider to maintain a healthy body weight or to lose weight. Ask what an ideal weight is for   you.  Get at least 30 minutes of exercise that causes your heart to beat faster (aerobic exercise) most days of the week. Activities may include walking, swimming, or biking.  Work with your health care provider or dietitian to adjust your eating plan to your individual calorie needs. What foods should I eat? Fruits All fresh, dried, or frozen fruit. Canned fruit in natural juice (without added sugar). Vegetables Fresh or frozen vegetables (raw, steamed, roasted, or grilled). Low-sodium or reduced-sodium tomato and vegetable juice. Low-sodium or reduced-sodium tomato sauce and tomato paste.  Low-sodium or reduced-sodium canned vegetables. Grains Whole-grain or whole-wheat bread. Whole-grain or whole-wheat pasta. Brown rice. Oatmeal. Quinoa. Bulgur. Whole-grain and low-sodium cereals. Pita bread. Low-fat, low-sodium crackers. Whole-wheat flour tortillas. Meats and other proteins Skinless chicken or turkey. Ground chicken or turkey. Pork with fat trimmed off. Fish and seafood. Egg whites. Dried beans, peas, or lentils. Unsalted nuts, nut butters, and seeds. Unsalted canned beans. Lean cuts of beef with fat trimmed off. Low-sodium, lean precooked or cured meat, such as sausages or meat loaves. Dairy Low-fat (1%) or fat-free (skim) milk. Reduced-fat, low-fat, or fat-free cheeses. Nonfat, low-sodium ricotta or cottage cheese. Low-fat or nonfat yogurt. Low-fat, low-sodium cheese. Fats and oils Soft margarine without trans fats. Vegetable oil. Reduced-fat, low-fat, or light mayonnaise and salad dressings (reduced-sodium). Canola, safflower, olive, avocado, soybean, and sunflower oils. Avocado. Seasonings and condiments Herbs. Spices. Seasoning mixes without salt. Other foods Unsalted popcorn and pretzels. Fat-free sweets. The items listed above may not be a complete list of foods and beverages you can eat. Contact a dietitian for more information. What foods should I avoid? Fruits Canned fruit in a light or heavy syrup. Fried fruit. Fruit in cream or butter sauce. Vegetables Creamed or fried vegetables. Vegetables in a cheese sauce. Regular canned vegetables (not low-sodium or reduced-sodium). Regular canned tomato sauce and paste (not low-sodium or reduced-sodium). Regular tomato and vegetable juice (not low-sodium or reduced-sodium). Pickles. Olives. Grains Baked goods made with fat, such as croissants, muffins, or some breads. Dry pasta or rice meal packs. Meats and other proteins Fatty cuts of meat. Ribs. Fried meat. Bacon. Bologna, salami, and other precooked or cured meats, such as  sausages or meat loaves. Fat from the back of a pig (fatback). Bratwurst. Salted nuts and seeds. Canned beans with added salt. Canned or smoked fish. Whole eggs or egg yolks. Chicken or turkey with skin. Dairy Whole or 2% milk, cream, and half-and-half. Whole or full-fat cream cheese. Whole-fat or sweetened yogurt. Full-fat cheese. Nondairy creamers. Whipped toppings. Processed cheese and cheese spreads. Fats and oils Butter. Stick margarine. Lard. Shortening. Ghee. Bacon fat. Tropical oils, such as coconut, palm kernel, or palm oil. Seasonings and condiments Onion salt, garlic salt, seasoned salt, table salt, and sea salt. Worcestershire sauce. Tartar sauce. Barbecue sauce. Teriyaki sauce. Soy sauce, including reduced-sodium. Steak sauce. Canned and packaged gravies. Fish sauce. Oyster sauce. Cocktail sauce. Store-bought horseradish. Ketchup. Mustard. Meat flavorings and tenderizers. Bouillon cubes. Hot sauces. Pre-made or packaged marinades. Pre-made or packaged taco seasonings. Relishes. Regular salad dressings. Other foods Salted popcorn and pretzels. The items listed above may not be a complete list of foods and beverages you should avoid. Contact a dietitian for more information. Where to find more information  National Heart, Lung, and Blood Institute: www.nhlbi.nih.gov  American Heart Association: www.heart.org  Academy of Nutrition and Dietetics: www.eatright.org  National Kidney Foundation: www.kidney.org Summary  The DASH eating plan is a healthy eating plan that has been shown to   reduce high blood pressure (hypertension). It may also reduce your risk for type 2 diabetes, heart disease, and stroke.  When on the DASH eating plan, aim to eat more fresh fruits and vegetables, whole grains, lean proteins, low-fat dairy, and heart-healthy fats.  With the DASH eating plan, you should limit salt (sodium) intake to 2,300 mg a day. If you have hypertension, you may need to reduce your  sodium intake to 1,500 mg a day.  Work with your health care provider or dietitian to adjust your eating plan to your individual calorie needs. This information is not intended to replace advice given to you by your health care provider. Make sure you discuss any questions you have with your health care provider. Document Revised: 09/18/2019 Document Reviewed: 09/18/2019 Elsevier Patient Education  2021 [place].  Tinnitus Tinnitus refers to hearing a sound when there is no actual source for that sound. This is often described as ringing in the ears. However, people with this condition may hear a variety of noises, in one ear or in both ears. The sounds of tinnitus can be soft, loud, or somewhere in between. Tinnitus can last for a few seconds or can be constant for days. It may go away without treatment and come back at various times. When tinnitus is constant or happens often, it can lead to other problems, such as trouble sleeping and trouble concentrating. Almost everyone experiences tinnitus at some point. Tinnitus that is long-lasting (chronic) or comes back often (recurs) may require medical attention. What are the causes? The cause of tinnitus is often not known. In some cases, it can result from:  Exposure to loud noises from machinery, music, or other sources.  An object (foreign body) stuck in the ear.  Earwax buildup.  Drinking alcohol or caffeine.  Taking certain medicines.  Age-related hearing loss. It may also be caused by medical conditions such as:  Ear or sinus infections.  High blood pressure.  Heart diseases.  Anemia.  Allergies.  Meniere's disease.  Thyroid problems.  Tumors.  A weak, bulging blood vessel (aneurysm) near the ear. What are the signs or symptoms? The main symptom of tinnitus is hearing a sound when there is no source for that sound. It may sound like:  Buzzing.  Roaring.  Ringing.  Blowing  air.  Hissing.  Whistling.  Sizzling.  Humming.  Running water.  A musical note.  Tapping. Symptoms may affect only one ear (unilateral) or both ears (bilateral). How is this diagnosed? Tinnitus is diagnosed based on your symptoms, your medical history, and a physical exam. Your health care provider may do a thorough hearing test (audiologic exam) if your tinnitus:  Is unilateral.  Causes hearing difficulties.  Lasts 6 months or longer. You may work with a health care provider who specializes in hearing disorders (audiologist). You may be asked questions about your symptoms and how they affect your daily life. You may have other tests done, such as:  CT scan.  MRI.  An imaging test of how blood flows through your blood vessels (angiogram). How is this treated? Treating an underlying medical condition can sometimes make tinnitus go away. If your tinnitus continues, other treatments may include:  Medicines.  Therapy and counseling to help you manage the stress of living with tinnitus.  Sound generators to mask the tinnitus. These include: ? Tabletop sound machines that play relaxing sounds to help you fall asleep. ? Wearable devices that fit in your ear and play sounds or music. ?  Acoustic neural stimulation. This involves using headphones to listen to music that contains an auditory signal. Over time, listening to this signal may change some pathways in your brain and make you less sensitive to tinnitus. This treatment is used for very severe cases when no other treatment is working.  Using hearing aids or cochlear implants if your tinnitus is related to hearing loss. Hearing aids are worn in the outer ear. Cochlear implants are surgically placed in the inner ear. Follow these instructions at home: Managing symptoms  When possible, avoid being in loud places and being exposed to loud sounds.  Wear hearing protection, such as earplugs, when you are exposed to loud  noises.  Use a white noise machine, a humidifier, or other devices to mask the sound of tinnitus.  Practice techniques for reducing stress, such as meditation, yoga, or deep breathing. Work with your health care provider if you need help with managing stress.  Sleep with your head slightly raised. This may reduce the impact of tinnitus.      General instructions  Do not use stimulants, such as nicotine, alcohol, or caffeine. Talk with your health care provider about other stimulants to avoid. Stimulants are substances that can make you feel alert and attentive by increasing certain activities in the body (such as heart rate and blood pressure). These substances may make tinnitus worse.  Take over-the-counter and prescription medicines only as told by your health care provider.  Try to get plenty of sleep each night.  Keep all follow-up visits as told by your health care provider. This is important. Contact a health care provider if:  Your tinnitus continues for 3 weeks or longer without stopping.  You develop sudden hearing loss.  Your symptoms get worse or do not get better with home care.  You feel you are not able to manage the stress of living with tinnitus. Get help right away if:  You develop tinnitus after a head injury.  You have tinnitus along with any of the following: ? Dizziness. ? Loss of balance. ? Nausea and vomiting. ? Sudden, severe headache. These symptoms may represent a serious problem that is an emergency. Do not wait to see if the symptoms will go away. Get medical help right away. Call your local emergency services (911 in the [place] Do not drive yourself to the hospital. Summary  Tinnitus refers to hearing a sound when there is no actual source for that sound. This is often described as ringing in the ears.  Symptoms may affect only one ear (unilateral) or both ears (bilateral).  Use a white noise machine, a humidifier, or other devices to mask the  sound of tinnitus.  Do not use stimulants, such as nicotine, alcohol, or caffeine. Talk with your health care provider about other stimulants to avoid. These substances may make tinnitus worse. This information is not intended to replace advice given to you by your health care provider. Make sure you discuss any questions you have with your health care provider. Document Revised: 04/28/2019 Document Reviewed: 07/25/2017 Elsevier Patient Education  2021 [employer].

## 2021-01-31 NOTE — Progress Notes (Signed)
New Patient Office Visit  Subjective:  Patient ID: Kenneth Brown, male    DOB: 24-Apr-1951  Age: 70 y.o. MRN: [id number]  CC:  Chief Complaint  Patient presents with  . New Patient (Initial Visit)    HPI Kenneth Brown presents to establish new primary care provider. He states that he has been having a soft ringing in his ears. Has been present for the past year or so. Bothers him more when things are quiet around him. States that he does have to turn up the volume on the TV and sometimes people have to speak a little louder for him. He denies ear pain, headache, or dizziness.  His last colonoscopy was done in 2016 per Dr. Allen Norris. A 43mm polyp and internal hemorrhoids were present. He was due to have a surveillance colonoscopy in 2021. He denies abdominal pain or changes in bowel habits.  He is due to have a wel;ness visit and routine lab work.  Blood pressure is mildly elevated today. He denies chest pain, chest pressure, shortness of breath, or headaches.  States that he has had both pneumonia vaccines and is fully vaccinated against COVID 19.   Past Medical History:  Diagnosis Date  . Acute cystitis   . Diabetes mellitus without complication ([place])    pre diabetic  . Difficulty urinating   . Gross hematuria   . Platelets decreased ([place])   . Renal calculus, bilateral   . UTI (urinary tract infection) 10/2014    Past Surgical History:  Procedure Laterality Date  . ANKLE SURGERY  08/28/12  . COLONOSCOPY WITH PROPOFOL N/A 07/08/2015   Procedure: COLONOSCOPY WITH PROPOFOL;  Surgeon: Lucilla Lame, MD;  Location: [place];  Service: Endoscopy;  Laterality: N/A;  . ESOPHAGOGASTRODUODENOSCOPY (EGD) WITH PROPOFOL N/A 07/08/2015   Procedure: ESOPHAGOGASTRODUODENOSCOPY (EGD) WITH PROPOFOL;  Surgeon: Lucilla Lame, MD;  Location: [place] [facility];  Service: Endoscopy;  Laterality: N/A;  . FRACTURE SURGERY N/A    Phreesia 01/30/2021  . JOINT REPLACEMENT N/A    Phreesia  01/30/2021  . ORIF ANKLE FRACTURE Right    plate and screws, pt still has  . POLYPECTOMY  07/08/2015   Procedure: POLYPECTOMY;  Surgeon: Lucilla Lame, MD;  Location: [facility];  Service: Endoscopy;;  . URETEROSCOPY WITH HOLMIUM LASER LITHOTRIPSY Right 03/21/2015   Procedure: URETEROSCOPY WITH HOLMIUM LASER LITHOTRIPSY,retrograde pyelogram,stent placement;  Surgeon: Collier Flowers, MD;  Location: [facility];  Service: Urology;  Laterality: Right;    Family History  Problem Relation Age of Onset  . Hypertension Mother   . Heart disease Mother   . Stroke Mother   . COPD Mother   . Heart disease Father   . Heart attack Father   . Lung cancer Father   . Skin cancer Father     Social History   Socioeconomic History  . Marital status: Married    Spouse name: Not on file  . Number of children: 1  . Years of education: Not on file  . Highest education level: Some college, no degree  Occupational History  . Occupation: part time Architect  Tobacco Use  . Smoking status: Never Smoker  . Smokeless tobacco: Never Used  Vaping Use  . Vaping Use: Never used  Substance and Sexual Activity  . Alcohol use: Yes    Alcohol/week: 0.0 - 1.0 standard drinks    Comment: 1-2 drinks a month  . Drug use: No  . Sexual activity: Yes  Other Topics Concern  .  Not on file  Social History Narrative  . Not on file   Social Determinants of Health   Financial Resource Strain: Not on file  Food Insecurity: Not on file  Transportation Needs: Not on file  Physical Activity: Not on file  Stress: Not on file  Social Connections: Not on file  Intimate Partner Violence: Not on file    ROS Review of Systems  Constitutional: Negative for activity change, chills and fever.  HENT: Positive for hearing loss and tinnitus. Negative for congestion and sinus pain.   Eyes: Negative.   Respiratory: Negative for cough, shortness of breath and wheezing.   Cardiovascular: Negative for chest pain and  palpitations.       Mildly elevated blood pressure today.   Gastrointestinal: Negative for abdominal pain, constipation, diarrhea, nausea and vomiting.  Endocrine: Negative.   Genitourinary: Negative.   Musculoskeletal: Negative for back pain and myalgias.  Skin: Negative for rash.  Allergic/Immunologic: Negative for environmental allergies.  Neurological: Negative for dizziness, weakness and headaches.  Hematological: Negative for adenopathy.  Psychiatric/Behavioral: Negative for dysphoric mood. The patient is not nervous/anxious.   All other systems reviewed and are negative.   Objective:   Today's Vitals   01/31/21 0907 01/31/21 0941  BP: (!) 159/87 (!) 146/83  Pulse: 85 60  Temp: 98.7 F (37.1 C)   SpO2: 95%   Weight: 237 lb 11.2 oz (107.8 kg)   Height: 5\' 9"  (1.753 m)    Body mass index is 35.1 kg/m.   Physical Exam Vitals and nursing note reviewed.  Constitutional:      Appearance: Normal appearance. He is well-developed.  HENT:     Head: Normocephalic and atraumatic.     Right Ear: Ear canal and external ear normal.     Left Ear: Ear canal and external ear normal.     Nose: Nose normal.     Mouth/Throat:     Mouth: Mucous membranes are moist.  Eyes:     Extraocular Movements: Extraocular movements intact.     Conjunctiva/sclera: Conjunctivae normal.     Pupils: Pupils are equal, round, and reactive to light.  Neck:     Vascular: No carotid bruit.  Cardiovascular:     Rate and Rhythm: Normal rate and regular rhythm.     Pulses: Normal pulses.     Heart sounds: Normal heart sounds.  Pulmonary:     Effort: Pulmonary effort is normal.     Breath sounds: Normal breath sounds.  Abdominal:     Palpations: Abdomen is soft.  Musculoskeletal:        General: Normal range of motion.     Cervical back: Normal range of motion and neck supple.  Skin:    General: Skin is warm and dry.     Capillary Refill: Capillary refill takes less than 2 seconds.  Neurological:      General: No focal deficit present.     Mental Status: He is alert and oriented to person, place, and time.  Psychiatric:        Mood and Affect: Mood normal.        Behavior: Behavior normal.        Thought Content: Thought content normal.        Judgment: Judgment normal.     Assessment & Plan:  1. Encounter to establish care Appointment today to establish new primary care provider.   2. Tinnitus of both ears Canals clear. Refer to ENT for further evaluation and possible treatment.  -  Ambulatory referral to ENT  3. Elevated blood-pressure reading without diagnosis of hypertension Patient advised to limit intake of salt and increase water in the diet. DASH diet information provided. Encouraged patient to monitor his blood pressure daily and keep log. Bring log to next visit.   4. B12 deficiency Check vitamin b12 with lab draw.  5. History of colon polyps A 58mm polyp found on colonoscopy done 2016. Due to have surveillance colonoscopy. Referral to Dr. Allen Norris today.   6. Screening for colon cancer Referral to Dr. Allen Norris today  - Ambulatory referral to Gastroenterology  Problem List Items Addressed This Visit      Other   History of colon polyps   B12 deficiency   Encounter to establish care - Primary   Tinnitus of both ears   Relevant Orders   Ambulatory referral to ENT   Elevated blood-pressure reading without diagnosis of hypertension   Screening for colon cancer   Relevant Orders   Ambulatory referral to Gastroenterology      Outpatient Encounter Medications as of 01/31/2021  Medication Sig  . [DISCONTINUED] acetaminophen (TYLENOL) 500 MG tablet Take by mouth.  (Patient not taking: Reported on 01/31/2021)  . [DISCONTINUED] chlorpheniramine-HYDROcodone (TUSSIONEX PENNKINETIC ER) 10-8 MG/5ML SUER Take 5 mLs by mouth every 12 (twelve) hours as needed for cough. (Patient not taking: Reported on 01/31/2021)  . [DISCONTINUED] HYDROcodone-homatropine (HYCODAN) 5-1.5 MG/5ML  syrup 5 ml 4-6 hours as needed for cough (Patient not taking: Reported on 01/31/2021)  . [DISCONTINUED] levothyroxine (SYNTHROID) 50 MCG tablet TAKE 1 TABLET BY MOUTH ONCE A DAY (Patient not taking: Reported on 01/31/2021)  . [DISCONTINUED] losartan (COZAAR) 25 MG tablet Take 1 tablet (25 mg total) by mouth daily. (Patient not taking: Reported on 01/31/2021)  . [DISCONTINUED] oxyCODONE (OXY IR/ROXICODONE) 5 MG immediate release tablet Take 1-2 tablets by mouth every 6 (six) hours as needed for pain. (Patient not taking: Reported on 01/31/2021)  . [DISCONTINUED] vitamin B-12 (CYANOCOBALAMIN) 1000 MCG tablet Take 1,000 mcg by mouth daily. (Patient not taking: Reported on 01/31/2021)  . [DISCONTINUED] vitamin C (ASCORBIC ACID) 500 MG tablet Take 500 mg by mouth daily. (Patient not taking: Reported on 01/31/2021)   No facility-administered encounter medications on file as of 01/31/2021.   Time spent with the patient was approximately 45 minutes. This time included reviewing progress notes, labs, imaging studies, and discussing plan for follow up.   Follow-up: Return in about 6 weeks (around 03/14/2021) for [place] - [facility] a week before - add PSA, Hep c screen, and B12 level.Marland Kitchen   Ronnell Freshwater, NP

## 2021-02-02 ENCOUNTER — Telehealth: Payer: Self-pay | Admitting: Nurse Practitioner

## 2021-02-02 DIAGNOSIS — H9313 Tinnitus, bilateral: Secondary | ICD-10-CM

## 2021-02-02 NOTE — Telephone Encounter (Signed)
Dr. Lucia Gaskins is an ENT within [facility]. He accepts patients insurance and can refer patient to an audiologist that accepts patients insurance. Or, patient can contact insurance company to see what audiologist are in network with his insurance and we will place a referral to them. AS, CMA

## 2021-02-02 NOTE — Telephone Encounter (Signed)
Patient made aware and will call back with who the referral needs to placed with.

## 2021-02-02 NOTE — Telephone Encounter (Signed)
Please advise patient that referral has been placed for ENT with Dr. Lucia Gaskins. AS, CMA

## 2021-02-02 NOTE — Addendum Note (Signed)
Addended by: Mickel Crow on: 02/02/2021 03:55 PM   Modules accepted: Orders

## 2021-02-02 NOTE — Telephone Encounter (Signed)
Patient would like to know her insurance will cover the audiologist as well because she was told it would not.

## 2021-02-02 NOTE — Telephone Encounter (Signed)
Patient is having some ringing in his ears. Patient was referred to Dr. Pollie Friar office accepts their insurance but the audiologist he uses does not. Patient would like an ENT that accepts insurance as well as an audiologist that does. Please advise, thanks.

## 2021-02-06 ENCOUNTER — Telehealth: Payer: Self-pay | Admitting: Nurse Practitioner

## 2021-02-06 NOTE — Telephone Encounter (Signed)
Ok to place referral? AS, CMA

## 2021-02-06 NOTE — Telephone Encounter (Signed)
I did place the referral during his visit. He was seen 4/5. I checked to make sure, and it was definitely added.

## 2021-02-06 NOTE — Telephone Encounter (Signed)
Patient needs an ENT referral for ringing in his ears, please advise. Thanks

## 2021-02-07 ENCOUNTER — Telehealth: Payer: Self-pay

## 2021-02-07 DIAGNOSIS — Z86010 Personal history of colonic polyps: Secondary | ICD-10-CM

## 2021-02-07 MED ORDER — NA SULFATE-K SULFATE-MG SULF 17.5-3.13-1.6 GM/177ML PO SOLN: 1.0000 | Freq: Once | ORAL | 0 refills | Status: AC

## 2021-02-07 NOTE — Telephone Encounter (Signed)
Gastroenterology Pre-Procedure Review  Request Date: 02/24/21 Requesting Physician: Dr. Allen Norris  PATIENT REVIEW QUESTIONS: The patient;'s wife responded to the following health history questions as indicated:    1. Are you having any GI issues? no 2. 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 Do you have a family history of Colon Cancer or Polyps? no 4. Diabetes Mellitus? no 5. Joint replacements in the past 12 months?no 6. Major health problems in the past 3 months?no 7. Any artificial heart valves, MVP, or defibrillator?no    MEDICATIONS & ALLERGIES:    Patient reports the following regarding taking any anticoagulation/antiplatelet therapy:   Plavix, Coumadin, Eliquis, Xarelto, Lovenox, Pradaxa, Brilinta, or Effient? no Aspirin? no  Patient confirms/reports the following medications:  No current outpatient medications on file.   No current facility-administered medications for this visit.    Patient confirms/reports the following allergies:  No Known Allergies  No orders of the defined types were placed in this encounter.   AUTHORIZATION INFORMATION Primary Insurance: 1D#: Group #:  Secondary Insurance: 1D#: Group #:  SCHEDULE INFORMATION: Date: Friday 02/24/21 Time: Location: [place]

## 2021-02-08 ENCOUNTER — Telehealth: Payer: Self-pay | Admitting: Gastroenterology

## 2021-02-08 ENCOUNTER — Other Ambulatory Visit: Payer: Self-pay

## 2021-02-08 DIAGNOSIS — Z86010 Personal history of colonic polyps: Secondary | ICD-10-CM

## 2021-02-08 MED ORDER — GOLYTELY 236 G PO SOLR: 4000.0000 mL | Freq: Once | ORAL | 0 refills | Status: AC

## 2021-02-08 NOTE — Telephone Encounter (Signed)
Returned wife's call.  Informed her that I've sent Gol[facility] Bowel prep to [place] [facility] for her husband.  Thanks,  [place], [place]

## 2021-02-08 NOTE — Telephone Encounter (Signed)
Patient's wife called asking for call back with less expensive alternative for her husband and her prep.

## 2021-02-16 ENCOUNTER — Telehealth: Payer: Self-pay

## 2021-02-16 NOTE — Telephone Encounter (Signed)
Patient would like to cancel his upcoming procedure. His family is traveling for the next two weeks. Will call back to reschedule.

## 2021-02-20 ENCOUNTER — Other Ambulatory Visit: Payer: Self-pay

## 2021-02-20 DIAGNOSIS — Z86010 Personal history of colonic polyps: Secondary | ICD-10-CM

## 2021-03-06 ENCOUNTER — Other Ambulatory Visit: Payer: Self-pay | Admitting: Nurse Practitioner

## 2021-03-06 DIAGNOSIS — E538 Deficiency of other specified B group vitamins: Secondary | ICD-10-CM

## 2021-03-06 DIAGNOSIS — Z0000 Encounter for general adult medical examination without abnormal findings: Secondary | ICD-10-CM

## 2021-03-06 DIAGNOSIS — E119 Type 2 diabetes mellitus without complications: Secondary | ICD-10-CM

## 2021-03-06 DIAGNOSIS — Z125 Encounter for screening for malignant neoplasm of prostate: Secondary | ICD-10-CM

## 2021-03-06 DIAGNOSIS — Z1159 Encounter for screening for other viral diseases: Secondary | ICD-10-CM

## 2021-03-09 ENCOUNTER — Other Ambulatory Visit: Payer: Self-pay

## 2021-03-09 ENCOUNTER — Other Ambulatory Visit: Payer: PPO

## 2021-03-09 DIAGNOSIS — E538 Deficiency of other specified B group vitamins: Secondary | ICD-10-CM

## 2021-03-09 DIAGNOSIS — Z125 Encounter for screening for malignant neoplasm of prostate: Secondary | ICD-10-CM | POA: Diagnosis not present

## 2021-03-09 DIAGNOSIS — Z1159 Encounter for screening for other viral diseases: Secondary | ICD-10-CM

## 2021-03-09 DIAGNOSIS — E119 Type 2 diabetes mellitus without complications: Secondary | ICD-10-CM | POA: Diagnosis not present

## 2021-03-09 DIAGNOSIS — Z0000 Encounter for general adult medical examination without abnormal findings: Secondary | ICD-10-CM | POA: Diagnosis not present

## 2021-03-10 DIAGNOSIS — D485 Neoplasm of uncertain behavior of skin: Secondary | ICD-10-CM | POA: Diagnosis not present

## 2021-03-10 DIAGNOSIS — D2272 Melanocytic nevi of left lower limb, including hip: Secondary | ICD-10-CM | POA: Diagnosis not present

## 2021-03-10 DIAGNOSIS — D225 Melanocytic nevi of trunk: Secondary | ICD-10-CM | POA: Diagnosis not present

## 2021-03-10 DIAGNOSIS — D2271 Melanocytic nevi of right lower limb, including hip: Secondary | ICD-10-CM | POA: Diagnosis not present

## 2021-03-10 DIAGNOSIS — L814 Other melanin hyperpigmentation: Secondary | ICD-10-CM | POA: Diagnosis not present

## 2021-03-10 DIAGNOSIS — D2261 Melanocytic nevi of right upper limb, including shoulder: Secondary | ICD-10-CM | POA: Diagnosis not present

## 2021-03-10 DIAGNOSIS — D0462 Carcinoma in situ of skin of left upper limb, including shoulder: Secondary | ICD-10-CM | POA: Diagnosis not present

## 2021-03-10 DIAGNOSIS — D2262 Melanocytic nevi of left upper limb, including shoulder: Secondary | ICD-10-CM | POA: Diagnosis not present

## 2021-03-10 LAB — LIPID PANEL
Chol/HDL Ratio: 5 ratio (ref 0.0–5.0)
Cholesterol, Total: 166 mg/dL (ref 100–199)
HDL: 33 mg/dL — ABNORMAL LOW (ref >39)
LDL Chol Calc (NIH): 76 mg/dL (ref 0–99)
Triglycerides: 352 mg/dL — ABNORMAL HIGH (ref 0–149)
VLDL Cholesterol Cal: 57 mg/dL — ABNORMAL HIGH (ref 5–40)

## 2021-03-10 LAB — COMPREHENSIVE METABOLIC PANEL
ALT: 42 IU/L (ref 0–44)
AST: 55 IU/L — ABNORMAL HIGH (ref 0–40)
Albumin/Globulin Ratio: 1.7 (ref 1.2–2.2)
Albumin: 4.3 g/dL (ref 3.7–4.7)
Alkaline Phosphatase: 82 IU/L (ref 44–121)
BUN/Creatinine Ratio: 11 (ref 10–24)
BUN: 13 mg/dL (ref 8–27)
Bilirubin Total: 1 mg/dL (ref 0.0–1.2)
CO2: 24 mmol/L (ref 20–29)
Calcium: 9.5 mg/dL (ref 8.6–10.2)
Chloride: 103 mmol/L (ref 96–106)
Creatinine, Ser: 1.15 mg/dL (ref 0.76–1.27)
Globulin, Total: 2.5 g/dL (ref 1.5–4.5)
Glucose: 139 mg/dL — ABNORMAL HIGH (ref 65–99)
Potassium: 4.6 mmol/L (ref 3.5–5.2)
Sodium: 141 mmol/L (ref 134–144)
Total Protein: 6.8 g/dL (ref 6.0–8.5)
eGFR: 68 mL/min/{1.73_m2} (ref >59)

## 2021-03-10 LAB — CBC
Hematocrit: 46.6 % (ref 37.5–51.0)
Hemoglobin: 15.7 g/dL (ref 13.0–17.7)
MCH: 30.2 pg (ref 26.6–33.0)
MCHC: 33.7 g/dL (ref 31.5–35.7)
MCV: 90 fL (ref 79–97)
Platelets: 98 10*3/uL — CL (ref 150–450)
RBC: 5.2 x10E6/uL (ref 4.14–5.80)
RDW: 14.5 % (ref 11.6–15.4)
WBC: 5 10*3/uL (ref 3.4–10.8)

## 2021-03-10 LAB — TSH: TSH: 4.34 u[IU]/mL (ref 0.450–4.500)

## 2021-03-10 LAB — VITAMIN B12: Vitamin B-12: 454 pg/mL (ref 232–1245)

## 2021-03-10 LAB — HEPATITIS C ANTIBODY: Hep C Virus Ab: <0.1 s/co ratio (ref 0.0–0.9)

## 2021-03-10 LAB — HEMOGLOBIN A1C
Est. average glucose Bld gHb Est-mCnc: 148 mg/dL
Hgb A1c MFr Bld: 6.8 % — ABNORMAL HIGH (ref 4.8–5.6)

## 2021-03-10 LAB — PSA: Prostate Specific Ag, Serum: 0.8 ng/mL (ref 0.0–4.0)

## 2021-03-10 NOTE — Progress Notes (Signed)
Platelet count low. Has history of this. Review labs with patient at visit 03/15/2021

## 2021-03-14 ENCOUNTER — Ambulatory Visit: Payer: PPO | Admitting: Nurse Practitioner

## 2021-03-15 ENCOUNTER — Encounter: Payer: Self-pay | Admitting: Nurse Practitioner

## 2021-03-15 ENCOUNTER — Ambulatory Visit (INDEPENDENT_AMBULATORY_CARE_PROVIDER_SITE_OTHER): Payer: PPO | Admitting: Nurse Practitioner

## 2021-03-15 ENCOUNTER — Other Ambulatory Visit: Payer: Self-pay

## 2021-03-15 VITALS — BP 132/79 | HR 63 | Temp 97.5°F | Ht 69.0 in | Wt 234.5 lb

## 2021-03-15 DIAGNOSIS — D696 Thrombocytopenia, unspecified: Secondary | ICD-10-CM

## 2021-03-15 DIAGNOSIS — E782 Mixed hyperlipidemia: Secondary | ICD-10-CM | POA: Diagnosis not present

## 2021-03-15 DIAGNOSIS — E119 Type 2 diabetes mellitus without complications: Secondary | ICD-10-CM

## 2021-03-15 DIAGNOSIS — Z0000 Encounter for general adult medical examination without abnormal findings: Secondary | ICD-10-CM

## 2021-03-15 LAB — POCT UA - MICROALBUMIN: Creatinine, POC: 300 mg/dL

## 2021-03-15 NOTE — Patient Instructions (Addendum)
Preventive Care 65 Years and Older, Male Preventive care refers to lifestyle choices and visits with your health care provider that can promote health and wellness. This includes:  A yearly physical exam. This is also called an annual wellness visit.  Regular dental and eye exams.  Immunizations.  Screening for certain conditions.  Healthy lifestyle choices, such as: ? Eating a healthy diet. ? Getting regular exercise. ? Not using drugs or products that contain nicotine and tobacco. ? Limiting alcohol use. What can I expect for my preventive care visit? Physical exam Your health care provider will check your:  Height and weight. These may be used to calculate your BMI (body mass index). BMI is a measurement that tells if you are at a healthy weight.  Heart rate and blood pressure.  Body temperature.  Skin for abnormal spots. Counseling Your health care provider may ask you questions about your:  Past medical problems.  Family's medical history.  Alcohol, tobacco, and drug use.  Emotional well-being.  Home life and relationship well-being.  Sexual activity.  Diet, exercise, and sleep habits.  History of falls.  Memory and ability to understand (cognition).  Work and work environment.  Access to firearms. What immunizations do I need? Vaccines are usually given at various ages, according to a schedule. Your health care provider will recommend vaccines for you based on your age, medical history, and lifestyle or other factors, such as travel or where you work.   What tests do I need? Blood tests  Lipid and cholesterol levels. These may be checked every 5 years, or more often depending on your overall health.  Hepatitis C test.  Hepatitis B test. Screening  Lung cancer screening. You may have this screening every year starting at age 55 if you have a 30-pack-year history of smoking and currently smoke or have quit within the past 15 years.  Colorectal  cancer screening. ? All adults should have this screening starting at age 50 and continuing until age 75. ? Your health care provider may recommend screening at age 45 if you are at increased risk. ? You will have tests every 1-10 years, depending on your results and the type of screening test.  Prostate cancer screening. Recommendations will vary depending on your family history and other risks.  Genital exam to check for testicular cancer or hernias.  Diabetes screening. ? This is done by checking your blood sugar (glucose) after you have not eaten for a while (fasting). ? You may have this done every 1-3 years.  Abdominal aortic aneurysm (AAA) screening. You may need this if you are a current or former smoker.  STD (sexually transmitted disease) testing, if you are at risk. Follow these instructions at home: Eating and drinking  Eat a diet that includes fresh fruits and vegetables, whole grains, lean protein, and low-fat dairy products. Limit your intake of foods with high amounts of sugar, saturated fats, and salt.  Take vitamin and mineral supplements as recommended by your health care provider.  Do not drink alcohol if your health care provider tells you not to drink.  If you drink alcohol: ? Limit how much you have to 0-2 drinks a day. ? Be aware of how much alcohol is in your drink. In the [place] one drink equals one 12 oz bottle of beer (355 mL), one 5 oz glass of wine (148 mL), or one 1 oz glass of hard liquor (44 mL).   Lifestyle  Take daily care of your teeth   and gums. Brush your teeth every morning and night with fluoride toothpaste. Floss one time each day.  Stay active. Exercise for at least 30 minutes 5 or more days each week.  Do not use any products that contain nicotine or tobacco, such as cigarettes, e-cigarettes, and chewing tobacco. If you need help quitting, ask your health care provider.  Do not use drugs.  If you are sexually active, practice safe sex.  Use a condom or other form of protection to prevent STIs (sexually transmitted infections).  Talk with your health care provider about taking a low-dose aspirin or statin.  Find healthy ways to cope with stress, such as: ? Meditation, yoga, or listening to music. ? Journaling. ? Talking to a trusted person. ? Spending time with friends and family. Safety  Always wear your seat belt while driving or riding in a vehicle.  Do not drive: ? If you have been drinking alcohol. Do not ride with someone who has been drinking. ? When you are tired or distracted. ? While texting.  Wear a helmet and other protective equipment during sports activities.  If you have firearms in your house, make sure you follow all gun safety procedures. What's next?  Visit your health care provider once a year for an annual wellness visit.  Ask your health care provider how often you should have your eyes and teeth checked.  Stay up to date on all vaccines. This information is not intended to replace advice given to you by your health care provider. Make sure you discuss any questions you have with your health care provider. Document Revised: 07/14/2019 Document Reviewed: 10/09/2018 Elsevier Patient Education  2021 [employer].  https://www.diabeteseducator.org/docs/default-source/living-with-diabetes/conquering-the-grocery-store-v1.pdf?sfvrsn=4">  Carbohydrate Counting for Diabetes Mellitus, Adult Carbohydrate counting is a method of keeping track of how many carbohydrates you eat. Eating carbohydrates naturally increases the amount of sugar (glucose) in the blood. Counting how many carbohydrates you eat improves your blood glucose control, which helps you manage your diabetes. It is important to know how many carbohydrates you can safely have in each meal. This is different for every person. A dietitian can help you make a meal plan and calculate how many carbohydrates you should have at each meal and  snack. What foods contain carbohydrates? Carbohydrates are found in the following foods:  Grains, such as breads and cereals.  Dried beans and soy products.  Starchy vegetables, such as potatoes, peas, and corn.  Fruit and fruit juices.  Milk and yogurt.  Sweets and snack foods, such as cake, cookies, candy, chips, and soft drinks.   How do I count carbohydrates in foods? There are two ways to count carbohydrates in food. You can read food labels or learn standard serving sizes of foods. You can use either of the methods or a combination of both. Using the Nutrition Facts label The Nutrition Facts list is included on the labels of almost all packaged foods and beverages in the [place] It includes:  The serving size.  Information about nutrients in each serving, including the grams (g) of carbohydrate per serving. To use the Nutrition Facts:  Decide how many servings you will have.  Multiply the number of servings by the number of carbohydrates per serving.  The resulting number is the total amount of carbohydrates that you will be having. Learning the standard serving sizes of foods When you eat carbohydrate foods that are not packaged or do not include Nutrition Facts on the label, you need to measure the servings in  order to count the amount of carbohydrates.  Measure the foods that you will eat with a food scale or measuring cup, if needed.  Decide how many standard-size servings you will eat.  Multiply the number of servings by 15. For foods that contain carbohydrates, one serving equals 15 g of carbohydrates. ? For example, if you eat 2 cups or 10 oz (300 g) of strawberries, you will have eaten 2 servings and 30 g of carbohydrates (2 servings x 15 g = 30 g).  For foods that have more than one food mixed, such as soups and casseroles, you must count the carbohydrates in each food that is included. The following list contains standard serving sizes of common carbohydrate-rich  foods. Each of these servings has about 15 g of carbohydrates:  1 slice of bread.  1 six-inch (15 cm) tortilla.  ? cup or 2 oz (53 g) cooked rice or pasta.   cup or 3 oz (85 g) cooked or canned, drained and rinsed beans or lentils.   cup or 3 oz (85 g) starchy vegetable, such as peas, corn, or squash.   cup or 4 oz (120 g) hot cereal.   cup or 3 oz (85 g) boiled or mashed potatoes, or  or 3 oz (85 g) of a large baked potato.   cup or 4 fl oz (118 mL) fruit juice.  1 cup or 8 fl oz (237 mL) milk.  1 small or 4 oz (106 g) apple.   or 2 oz (63 g) of a medium banana.  1 cup or 5 oz (150 g) strawberries.  3 cups or 1 oz (24 g) popped popcorn. What is an example of carbohydrate counting? To calculate the number of carbohydrates in this sample meal, follow the steps shown below. Sample meal  3 oz (85 g) chicken breast.  ? cup or 4 oz (106 g) brown rice.   cup or 3 oz (85 g) corn.  1 cup or 8 fl oz (237 mL) milk.  1 cup or 5 oz (150 g) strawberries with sugar-free whipped topping. Carbohydrate calculation 1. Identify the foods that contain carbohydrates: ? Rice. ? Corn. ? Milk. ? Strawberries. 2. Calculate how many servings you have of each food: ? 2 servings rice. ? 1 serving corn. ? 1 serving milk. ? 1 serving strawberries. 3. Multiply each number of servings by 15 g: ? 2 servings rice x 15 g = 30 g. ? 1 serving corn x 15 g = 15 g. ? 1 serving milk x 15 g = 15 g. ? 1 serving strawberries x 15 g = 15 g. 4. Add together all of the amounts to find the total grams of carbohydrates eaten: ? 30 g + 15 g + 15 g + 15 g = 75 g of carbohydrates total. What are tips for following this plan? Shopping  Develop a meal plan and then make a shopping list.  Buy fresh and frozen vegetables, fresh and frozen fruit, dairy, eggs, beans, lentils, and whole grains.  Look at food labels. Choose foods that have more fiber and less sugar.  Avoid processed foods and foods  with added sugars. Meal planning  Aim to have the same amount of carbohydrates at each meal and for each snack time.  Plan to have regular, balanced meals and snacks. Where to find more information  American Diabetes Association: www.diabetes.org  Centers for Disease Control and Prevention: http://www.wolf.info/ Summary  Carbohydrate counting is a method of keeping track of  how many carbohydrates you eat.  Eating carbohydrates naturally increases the amount of sugar (glucose) in the blood.  Counting how many carbohydrates you eat improves your blood glucose control, which helps you manage your diabetes.  A dietitian can help you make a meal plan and calculate how many carbohydrates you should have at each meal and snack. This information is not intended to replace advice given to you by your health care provider. Make sure you discuss any questions you have with your health care provider. Document Revised: 10/15/2019 Document Reviewed: 10/16/2019 Elsevier Patient Education  2021 [employer].  Diabetes Mellitus and Nutrition, Adult When you have diabetes, or diabetes mellitus, it is very important to have healthy eating habits because your blood sugar (glucose) levels are greatly affected by what you eat and drink. Eating healthy foods in the right amounts, at about the same times every day, can help you:  Control your blood glucose.  Lower your risk of heart disease.  Improve your blood pressure.  Reach or maintain a healthy weight. What can affect my meal plan? Every person with diabetes is different, and each person has different needs for a meal plan. Your health care provider may recommend that you work with a dietitian to make a meal plan that is best for you. Your meal plan may vary depending on factors such as:  The calories you need.  The medicines you take.  Your weight.  Your blood glucose, blood pressure, and cholesterol levels.  Your activity level.  Other health  conditions you have, such as heart or kidney disease. How do carbohydrates affect me? Carbohydrates, also called carbs, affect your blood glucose level more than any other type of food. Eating carbs naturally raises the amount of glucose in your blood. Carb counting is a method for keeping track of how many carbs you eat. Counting carbs is important to keep your blood glucose at a healthy level, especially if you use insulin or take certain oral diabetes medicines. It is important to know how many carbs you can safely have in each meal. This is different for every person. Your dietitian can help you calculate how many carbs you should have at each meal and for each snack. How does alcohol affect me? Alcohol can cause a sudden decrease in blood glucose (hypoglycemia), especially if you use insulin or take certain oral diabetes medicines. Hypoglycemia can be a life-threatening condition. Symptoms of hypoglycemia, such as sleepiness, dizziness, and confusion, are similar to symptoms of having too much alcohol.  Do not drink alcohol if: ? Your health care provider tells you not to drink. ? You are pregnant, may be pregnant, or are planning to become pregnant.  If you drink alcohol: ? Do not drink on an empty stomach. ? Limit how much you use to:  0-1 drink a day for women.  0-2 drinks a day for men. ? Be aware of how much alcohol is in your drink. In the [place] one drink equals one 12 oz bottle of beer (355 mL), one 5 oz glass of wine (148 mL), or one 1 oz glass of hard liquor (44 mL). ? Keep yourself hydrated with water, diet soda, or unsweetened iced tea.  Keep in mind that regular soda, juice, and other mixers may contain a lot of sugar and must be counted as carbs. What are tips for following this plan? Reading food labels  Start by checking the serving size on the "Nutrition Facts" label of packaged foods and drinks.  The amount of calories, carbs, fats, and other nutrients listed on the  label is based on one serving of the item. Many items contain more than one serving per package.  Check the total grams (g) of carbs in one serving. You can calculate the number of servings of carbs in one serving by dividing the total carbs by 15. For example, if a food has 30 g of total carbs per serving, it would be equal to 2 servings of carbs.  Check the number of grams (g) of saturated fats and trans fats in one serving. Choose foods that have a low amount or none of these fats.  Check the number of milligrams (mg) of salt (sodium) in one serving. Most people should limit total sodium intake to less than 2,300 mg per day.  Always check the nutrition information of foods labeled as "low-fat" or "nonfat." These foods may be higher in added sugar or refined carbs and should be avoided.  Talk to your dietitian to identify your daily goals for nutrients listed on the label. Shopping  Avoid buying canned, pre-made, or processed foods. These foods tend to be high in fat, sodium, and added sugar.  Shop around the outside edge of the grocery store. This is where you will most often find fresh fruits and vegetables, bulk grains, fresh meats, and fresh dairy. Cooking  Use low-heat cooking methods, such as baking, instead of high-heat cooking methods like deep frying.  Cook using healthy oils, such as olive, canola, or sunflower oil.  Avoid cooking with butter, cream, or high-fat meats. Meal planning  Eat meals and snacks regularly, preferably at the same times every day. Avoid going long periods of time without eating.  Eat foods that are high in fiber, such as fresh fruits, vegetables, beans, and whole grains. Talk with your dietitian about how many servings of carbs you can eat at each meal.  Eat 4-6 oz (112-168 g) of lean protein each day, such as lean meat, chicken, fish, eggs, or tofu. One ounce (oz) of lean protein is equal to: ? 1 oz (28 g) of meat, chicken, or fish. ? 1 egg. ?   cup (62 g) of tofu.  Eat some foods each day that contain healthy fats, such as avocado, nuts, seeds, and fish.   What foods should I eat? Fruits Berries. Apples. Oranges. Peaches. Apricots. Plums. Grapes. Mango. Papaya. Pomegranate. Kiwi. Cherries. Vegetables Lettuce. Spinach. Leafy greens, including kale, chard, collard greens, and mustard greens. Beets. Cauliflower. Cabbage. Broccoli. Carrots. Green beans. Tomatoes. Peppers. Onions. Cucumbers. Brussels sprouts. Grains Whole grains, such as whole-wheat or whole-grain bread, crackers, tortillas, cereal, and pasta. Unsweetened oatmeal. Quinoa. Brown or wild rice. Meats and other proteins Seafood. Poultry without skin. Lean cuts of poultry and beef. Tofu. Nuts. Seeds. Dairy Low-fat or fat-free dairy products such as milk, yogurt, and cheese. The items listed above may not be a complete list of foods and beverages you can eat. Contact a dietitian for more information. What foods should I avoid? Fruits Fruits canned with syrup. Vegetables Canned vegetables. Frozen vegetables with butter or cream sauce. Grains Refined white flour and flour products such as bread, pasta, snack foods, and cereals. Avoid all processed foods. Meats and other proteins Fatty cuts of meat. Poultry with skin. Breaded or fried meats. Processed meat. Avoid saturated fats. Dairy Full-fat yogurt, cheese, or milk. Beverages Sweetened drinks, such as soda or iced tea. The items listed above may not be a complete list of foods and beverages  you should avoid. Contact a dietitian for more information. Questions to ask a health care provider  Do I need to meet with a diabetes educator?  Do I need to meet with a dietitian?  What number can I call if I have questions?  When are the best times to check my blood glucose? Where to find more information:  American Diabetes Association: diabetes.org  Academy of Nutrition and Dietetics: www.eatright.[employer] of Diabetes and Digestive and Kidney Diseases: [employer].dk  Association of Diabetes Care and Education Specialists: www.diabeteseducator.org Summary  It is important to have healthy eating habits because your blood sugar (glucose) levels are greatly affected by what you eat and drink.  A healthy meal plan will help you control your blood glucose and maintain a healthy lifestyle.  Your health care provider may recommend that you work with a dietitian to make a meal plan that is best for you.  Keep in mind that carbohydrates (carbs) and alcohol have immediate effects on your blood glucose levels. It is important to count carbs and to use alcohol carefully. This information is not intended to replace advice given to you by your health care provider. Make sure you discuss any questions you have with your health care provider. Document Revised: 09/22/2019 Document Reviewed: 09/22/2019 Elsevier Patient Education  2021 [employer].

## 2021-03-15 NOTE — Progress Notes (Signed)
HPI   Kenneth Brown is a 70 y.o. male who presents for Medicare Annual/Subsequent preventive examination. The patient reports no physical concerns or complaints today. Routine, fasting blood work was done prior to this visit. The triglyceride level was moderately elevated, however, the total cholesterol and the CHOL./HDL ratio were both within normal limits.  Lipid Panel     Component Value Date/Time   CHOL 166 03/09/2021 0837   TRIG 352 (H) 03/09/2021 0837   HDL 33 (L) 03/09/2021 0837   CHOLHDL 5.0 03/09/2021 0837   LDLCALC 76 03/09/2021 0837   LABVLDL 57 (H) 03/09/2021 0837   His blood sugar and Hgb were also elevated. His HgbA1c was 6.8. previously it has been 5.8.  We have discussed dietary and lifestyle changes which he can make in order to lower these numbers and avoid adding daily medications. Written information provided at the end of visit to support discussion.  Platelet count was decreased. This has been trend in blood work for past few years. Recent check showed platelet count at 98. In the past, it had been as low as 78. He states that he has seen hematologist in the past. This was for very high platelet counts. Hematology ruled out cancer. He states that he was given B12 injections for some time, and eventually the platelet count swung in opposite direction. He is no longer taking B12 injections or supplements. He has not seen hematology in several years.   CBC Latest Ref Rng & Units 03/09/2021 09/30/2018 04/03/2018  WBC 3.4 - 10.8 x10E3/uL 5.0 9.4 5.0  Hemoglobin 13.0 - 17.7 g/dL 15.7 15.4 15.4  Hematocrit 37.5 - 51.0 % 46.6 46.4 46.5  Platelets 150 - 450 x10E3/uL 98(LL) 139(L) 79(LL)   His other labs were within normal limits. Results were reviewed today.  The patient is scheduled to have screening colonoscopy in 03/2021.    Review of Systems      Review of Systems  Constitutional: Negative for activity change, chills and fever.  HENT: Negative for congestion, postnasal  drip, rhinorrhea, sinus pressure, sinus pain and sore throat.   Eyes: Negative.   Respiratory: Negative for cough, shortness of breath and wheezing.   Cardiovascular: Negative for chest pain and palpitations.  Gastrointestinal: Negative for constipation, diarrhea, nausea and vomiting.  Endocrine: Negative for cold intolerance, heat intolerance, polydipsia and polyuria.  Genitourinary: Negative for dysuria, frequency and urgency.  Musculoskeletal: Negative for arthralgias, back pain and myalgias.  Skin: Negative for rash.  Allergic/Immunologic: Negative.   Neurological: Negative for dizziness, weakness and headaches.  Hematological: Negative for adenopathy. Bruises/bleeds easily.  Psychiatric/Behavioral: Negative for dysphoric mood and sleep disturbance. The patient is not nervous/anxious.   All other systems reviewed and are negative.  Objective:    Today's Vitals   03/15/21 0945 03/15/21 1059  BP: (!) 145/87 132/79  Pulse: 75 63  Temp: (!) 97.5 F (36.4 C)   SpO2: 94%   Weight: 234 lb 8 oz (106.4 kg)   Height: 5\' 9"  (1.753 m)    Body mass index is 34.63 kg/m.  Physical Exam Vitals and nursing note reviewed.  Constitutional:      Appearance: Normal appearance. He is well-developed. He is obese.  HENT:     Head: Normocephalic and atraumatic.     Mouth/Throat:     Mouth: Mucous membranes are moist.     Pharynx: Oropharynx is clear.  Eyes:     Extraocular Movements: Extraocular movements intact.     Conjunctiva/sclera: Conjunctivae normal.  Pupils: Pupils are equal, round, and reactive to light.  Neck:     Vascular: No carotid bruit.  Cardiovascular:     Rate and Rhythm: Normal rate and regular rhythm.     Heart sounds: Normal heart sounds.  Pulmonary:     Effort: Pulmonary effort is normal.     Breath sounds: Normal breath sounds.  Abdominal:     General: Bowel sounds are normal.     Palpations: Abdomen is soft.     Tenderness: There is no abdominal tenderness.   Musculoskeletal:        General: Normal range of motion.     Cervical back: Normal range of motion and neck supple.  Skin:    General: Skin is warm and dry.     Capillary Refill: Capillary refill takes less than 2 seconds.  Neurological:     General: No focal deficit present.     Mental Status: He is alert and oriented to person, place, and time.  Psychiatric:        Mood and Affect: Mood normal.        Behavior: Behavior normal.        Thought Content: Thought content normal.        Judgment: Judgment normal.    Advanced Directives 01/08/2018 02/19/2017 06/14/2016 01/24/2016 08/26/2015 07/08/2015 05/20/2015  Does Patient Have a Medical Advance Directive? Yes Yes Yes Yes No Yes Yes  Type of Paramedic of [place];Living will Living will [place];Living will [place];Living will - [place]  Does patient want to make changes to medical advance directive? - (No Data) - - - - No - Patient declined  Copy of [place] in Chart? No - copy requested - - - - Yes Yes  Would patient like information on creating a medical advance directive? - - - - No - patient declined information - -    Current Medications (verified) Outpatient Encounter Medications as of 03/15/2021  Medication Sig  . Ascorbic Acid (VITAMIN C) 1000 MG tablet Take 1,000 mg by mouth daily.  . B Complex-C-Biotin-D-Zinc-FA (VITAL-D RX PO) Take 50 mg by mouth daily.   No facility-administered encounter medications on file as of 03/15/2021.    Allergies (verified) Patient has no known allergies.   History: Past Medical History:  Diagnosis Date  . Acute cystitis   . Diabetes mellitus without complication ([place])    pre diabetic  . Difficulty urinating   . Gross hematuria   . Platelets decreased ([place])   . Renal calculus, bilateral   . UTI (urinary tract infection) 10/2014   Past Surgical  History:  Procedure Laterality Date  . ANKLE SURGERY  08/28/12  . COLONOSCOPY WITH PROPOFOL N/A 07/08/2015   Procedure: COLONOSCOPY WITH PROPOFOL;  Surgeon: Lucilla Lame, MD;  Location: [facility];  Service: Endoscopy;  Laterality: N/A;  . ESOPHAGOGASTRODUODENOSCOPY (EGD) WITH PROPOFOL N/A 07/08/2015   Procedure: ESOPHAGOGASTRODUODENOSCOPY (EGD) WITH PROPOFOL;  Surgeon: Lucilla Lame, MD;  Location: [facility];  Service: Endoscopy;  Laterality: N/A;  . FRACTURE SURGERY N/A    Phreesia 01/30/2021  . JOINT REPLACEMENT N/A    Phreesia 01/30/2021  . ORIF ANKLE FRACTURE Right    plate and screws, pt still has  . POLYPECTOMY  07/08/2015   Procedure: POLYPECTOMY;  Surgeon: Lucilla Lame, MD;  Location: [facility];  Service: Endoscopy;;  . URETEROSCOPY WITH HOLMIUM LASER LITHOTRIPSY Right 03/21/2015  Procedure: URETEROSCOPY WITH HOLMIUM LASER LITHOTRIPSY,retrograde pyelogram,stent placement;  Surgeon: Collier Flowers, MD;  Location: [facility];  Service: Urology;  Laterality: Right;   Family History  Problem Relation Age of Onset  . Hypertension Mother   . Heart disease Mother   . Stroke Mother   . COPD Mother   . Heart disease Father   . Heart attack Father   . Lung cancer Father   . Skin cancer Father    Social History   Socioeconomic History  . Marital status: Married    Spouse name: Not on file  . Number of children: 1  . Years of education: Not on file  . Highest education level: Some college, no degree  Occupational History  . Occupation: part time Architect  Tobacco Use  . Smoking status: Never Smoker  . Smokeless tobacco: Never Used  Vaping Use  . Vaping Use: Never used  Substance and Sexual Activity  . Alcohol use: Yes    Alcohol/week: 0.0 - 1.0 standard drinks    Comment: 1-2 drinks a month  . Drug use: No  . Sexual activity: Yes  Other Topics Concern  . Not on file  Social History Narrative  . Not on file   Social Determinants of Health    Financial Resource Strain: Not on file  Food Insecurity: Not on file  Transportation Needs: Not on file  Physical Activity: Not on file  Stress: Not on file  Social Connections: Not on file    Tobacco Counseling N/a - patient never a smoker.  Diabetic?Yes        Activities of Daily Living In your present state of health, do you have any difficulty performing the following activities: 03/15/2021 03/15/2021  Hearing? N N  Vision? N N  Difficulty concentrating or making decisions? N N  Walking or climbing stairs? N N  Dressing or bathing? N N  Doing errands, shopping? N N  Some recent data might be hidden    Patient Care Team: Ronnell Freshwater, NP as PCP - General (Family Medicine) Lucilla Lame, MD as Consulting Physician (Gastroenterology) Lorelee Cover., MD as Consulting Physician (Ophthalmology) Diona Browner Paulino Door, MD as Referring Physician (Orthopedic Surgery)     Assessment:  1. Encounter for Medicare annual wellness exam Annual medicare wellness visit today.   2. Type 2 diabetes mellitus without complication, without long-term current use of insulin (HCC) HgbA1c 6.8 on labs done 03/09/2021. Discussed diet and lifestyle changes which can be made to help lower blood sugars without adding medication at this time. He voiced understanding. Written information provided at the end of visit to support discussion. Urine micro albumin was within normal limits today.  - POCT UA - Microalbumin  3. Mixed hyperlipidemia High triglycerides present on recent lab panel. Discussed diet and lifestyle activities which can be made to lower these numbers without adding new medication. Written information was provided as well. Will recheck lipid panel prior to next visit for further evaluation.   4. Decreased platelet count (HCC) Platelet count 98 on recent labs. Patient has history of abnormal platelet count, both elevated and decreased. Will have him restart vitamin b12 supplement.  Will recheck cbc prior to next visit. Discussed possibility that a new referral to hematology may be made if count still low or lower. He voiced understanding and agreement with the plan.   Hearing/Vision screen No exam data present   Depression Screen PHQ 2/9 Scores 03/15/2021 01/31/2021 02/25/2018 01/08/2018 02/19/2017 01/24/2016  PHQ - 2  Score 0 0 1 0 0 0  PHQ- 9 Score 1 1 - - - -    Fall Risk Fall Risk  03/15/2021 01/31/2021 06/01/2019 02/25/2018 01/08/2018  Falls in the past year? 0 0 0 No No  Comment - - Emmi Telephone Survey: data to providers prior to load - -  Number falls in past yr: 0 - - - -  Injury with Fall? 0 - - - -  Follow up Falls evaluation completed Falls evaluation completed - - -    FALL RISK PREVENTION PERTAINING TO THE HOME:  Any stairs in or around the home? No  If so, are there any without handrails? No  Home free of loose throw rugs in walkways, pet beds, electrical cords, etc? Yes  Adequate lighting in your home to reduce risk of falls? Yes   ASSISTIVE DEVICES UTILIZED TO PREVENT FALLS:  Life alert? No  Use of a cane, walker or w/c? No  Grab bars in the bathroom? No  Shower chair or bench in shower? Yes  Elevated toilet seat or a handicapped toilet? Yes   TIMED UP AND GO:  Was the test performed? Yes .  Length of time to ambulate 10 feet:  11 sec.   Gait steady and fast without use of assistive device  Cognitive Function:     6CIT Screen 03/15/2021 02/19/2017  What Year? 0 points 0 points  What month? 0 points 0 points  What time? 0 points 0 points  Count back from 20 0 points 0 points  Months in reverse 2 points 0 points  Repeat phrase 0 points 0 points  Total Score 2 0    Immunizations Immunization History  Administered Date(s) Administered  . Fluad Quad(high Dose 65+) 08/10/2019  . Influenza, High Dose Seasonal PF 10/03/2016, 08/26/2017, 09/03/2018  . Influenza,inj,quad, With Preservative 07/29/2016  . Moderna Sars-Covid-2 Vaccination  12/26/2019, 08/25/2020, 03/13/2021  . Pneumococcal Conjugate-13 01/24/2016  . Pneumococcal Polysaccharide-23 02/19/2017  . Tdap 05/20/2013  . Zoster 05/20/2013    TDAP status: Up to date  Flu Vaccine status: Up to date  Pneumococcal vaccine status: Up to date  Covid-19 vaccine status: Completed vaccines  Qualifies for Shingles Vaccine? Yes   Zostavax completed Yes   Shingrix Completed?: No.    Education has been provided regarding the importance of this vaccine. Patient has been advised to call insurance company to determine out of pocket expense if they have not yet received this vaccine. Advised may also receive vaccine at local pharmacy or Health Dept. Verbalized acceptance and understanding.  Screening Tests Health Maintenance  Topic Date Due  . FOOT EXAM  05/28/2018  . OPHTHALMOLOGY EXAM  06/20/2018  . COLONOSCOPY (Pts 45-20yrs Insurance coverage will need to be confirmed)  07/07/2020  . INFLUENZA VACCINE  05/29/2021  . HEMOGLOBIN A1C  09/09/2021  . URINE MICROALBUMIN  03/15/2022  . TETANUS/TDAP  05/21/2023  . COVID-19 Vaccine  Completed  . Hepatitis C Screening  Completed  . PNA vac Low Risk Adult  Completed  . HPV VACCINES  Aged Out    Health Maintenance  Health Maintenance Due  Topic Date Due  . FOOT EXAM  05/28/2018  . OPHTHALMOLOGY EXAM  06/20/2018  . COLONOSCOPY (Pts 45-18yrs Insurance coverage will need to be confirmed)  07/07/2020    Colorectal: Pt has apt scheduled for Colonoscopy for June 13th, 2022  Lung Cancer Screening: (Low Dose CT Chest recommended if Age 69-80 years, 30 pack-year currently smoking OR have quit w/in 15years.)  does not qualify.   Lung Cancer Screening Referral:   Additional Screening:  Hepatitis C Screening: does qualify; Completed 03/09/2021  Vision Screening: Recommended annual ophthalmology exams for early detection of glaucoma and other disorders of the eye. Is the patient up to date with their annual eye exam?  Yes Who  is the provider or what is the name of the office in which the patient attends annual eye exams? Dr. Gloriann Loan If pt is not established with a provider, would they like to be referred to a provider to establish care? No .   Dental Screening: Recommended annual dental exams for proper oral hygiene  Community Resource Referral / Chronic Care Management: CRR required this visit? no  CCM required this visit?  No      Plan:     I have personally reviewed and noted the following in the patient's chart:   . Medical and social history . Use of alcohol, tobacco or illicit drugs  . Current medications and supplements including opioid prescriptions.  . Functional ability and status . Nutritional status . Physical activity . Advanced directives . List of other physicians . Hospitalizations, surgeries, and ER visits in previous 12 months . Vitals . Screenings to include cognitive, depression, and falls . Referrals and appointments  In addition, I have reviewed and discussed with patient certain preventive protocols, quality metrics, and best practice recommendations. A written personalized care plan for preventive services as well as general preventive health recommendations were provided to patient.

## 2021-03-17 DIAGNOSIS — D0462 Carcinoma in situ of skin of left upper limb, including shoulder: Secondary | ICD-10-CM | POA: Diagnosis not present

## 2021-03-28 ENCOUNTER — Encounter: Payer: Self-pay | Admitting: Gastroenterology

## 2021-03-29 ENCOUNTER — Encounter: Payer: Self-pay | Admitting: Anesthesiology

## 2021-04-05 ENCOUNTER — Telehealth: Payer: Self-pay | Admitting: Gastroenterology

## 2021-04-05 NOTE — Telephone Encounter (Signed)
Wife called to cancel patient's procedure scheduled on 04/10/2021, she stated that the patient isn't feeling well and will call back to reschedule.

## 2021-04-05 NOTE — Telephone Encounter (Signed)
Pt's procedure has been cancelled due to pt not feeling well. Wife stated they will call back at a later time.

## 2021-04-10 ENCOUNTER — Ambulatory Visit: Admission: RE | Admit: 2021-04-10 | Payer: PPO | Source: Home / Self Care | Admitting: Gastroenterology

## 2021-04-10 HISTORY — DX: Unspecified osteoarthritis, unspecified site: M19.90

## 2021-04-10 SURGERY — COLONOSCOPY WITH PROPOFOL
Anesthesia: General

## 2021-06-06 ENCOUNTER — Other Ambulatory Visit: Payer: Self-pay | Admitting: Nurse Practitioner

## 2021-06-06 ENCOUNTER — Encounter: Payer: Self-pay | Admitting: Gastroenterology

## 2021-06-06 DIAGNOSIS — E782 Mixed hyperlipidemia: Secondary | ICD-10-CM

## 2021-06-06 DIAGNOSIS — E119 Type 2 diabetes mellitus without complications: Secondary | ICD-10-CM

## 2021-06-06 DIAGNOSIS — Z0000 Encounter for general adult medical examination without abnormal findings: Secondary | ICD-10-CM

## 2021-06-08 ENCOUNTER — Other Ambulatory Visit: Payer: PPO

## 2021-06-08 ENCOUNTER — Other Ambulatory Visit: Payer: Self-pay

## 2021-06-08 DIAGNOSIS — Z0000 Encounter for general adult medical examination without abnormal findings: Secondary | ICD-10-CM | POA: Diagnosis not present

## 2021-06-08 DIAGNOSIS — E119 Type 2 diabetes mellitus without complications: Secondary | ICD-10-CM

## 2021-06-08 DIAGNOSIS — E782 Mixed hyperlipidemia: Secondary | ICD-10-CM

## 2021-06-09 LAB — COMPREHENSIVE METABOLIC PANEL
ALT: 41 IU/L (ref 0–44)
AST: 51 IU/L — ABNORMAL HIGH (ref 0–40)
Albumin/Globulin Ratio: 2 (ref 1.2–2.2)
Albumin: 4.5 g/dL (ref 3.7–4.7)
Alkaline Phosphatase: 85 IU/L (ref 44–121)
BUN/Creatinine Ratio: 15 (ref 10–24)
BUN: 14 mg/dL (ref 8–27)
Bilirubin Total: 1 mg/dL (ref 0.0–1.2)
CO2: 25 mmol/L (ref 20–29)
Calcium: 9.3 mg/dL (ref 8.6–10.2)
Chloride: 102 mmol/L (ref 96–106)
Creatinine, Ser: 0.91 mg/dL (ref 0.76–1.27)
Globulin, Total: 2.2 g/dL (ref 1.5–4.5)
Glucose: 145 mg/dL — ABNORMAL HIGH (ref 65–99)
Potassium: 4.3 mmol/L (ref 3.5–5.2)
Sodium: 141 mmol/L (ref 134–144)
Total Protein: 6.7 g/dL (ref 6.0–8.5)
eGFR: 90 mL/min/{1.73_m2} (ref >59)

## 2021-06-09 LAB — LIPID PANEL
Chol/HDL Ratio: 5.1 ratio — ABNORMAL HIGH (ref 0.0–5.0)
Cholesterol, Total: 154 mg/dL (ref 100–199)
HDL: 30 mg/dL — ABNORMAL LOW (ref >39)
LDL Chol Calc (NIH): 52 mg/dL (ref 0–99)
Triglycerides: 482 mg/dL — ABNORMAL HIGH (ref 0–149)
VLDL Cholesterol Cal: 72 mg/dL — ABNORMAL HIGH (ref 5–40)

## 2021-06-09 LAB — HEMOGLOBIN A1C
Est. average glucose Bld gHb Est-mCnc: 137 mg/dL
Hgb A1c MFr Bld: 6.4 % — ABNORMAL HIGH (ref 4.8–5.6)

## 2021-06-09 LAB — CBC
Hematocrit: 45.4 % (ref 37.5–51.0)
Hemoglobin: 15.5 g/dL (ref 13.0–17.7)
MCH: 30.6 pg (ref 26.6–33.0)
MCHC: 34.1 g/dL (ref 31.5–35.7)
MCV: 90 fL (ref 79–97)
Platelets: 97 10*3/uL — CL (ref 150–450)
RBC: 5.06 x10E6/uL (ref 4.14–5.80)
RDW: 14.6 % (ref 11.6–15.4)
WBC: 5.5 10*3/uL (ref 3.4–10.8)

## 2021-06-09 NOTE — Progress Notes (Signed)
Blood sugars look good. Triglycerides very elevated. Low platelet count which is stable. Will discuss labs with patient at visit 06/15/2021

## 2021-06-12 ENCOUNTER — Encounter
Admission: RE | Disposition: A | Discharge status: Home / Self Care | Payer: Self-pay | Source: Home / Self Care | Attending: Gastroenterology

## 2021-06-12 ENCOUNTER — Ambulatory Visit: Payer: PPO | Admitting: Anesthesiology

## 2021-06-12 ENCOUNTER — Ambulatory Visit
Admission: RE | Admit: 2021-06-12 | Discharge: 2021-06-12 | Disposition: A | Discharge status: Home / Self Care | Payer: PPO | Attending: Gastroenterology | Admitting: Gastroenterology

## 2021-06-12 ENCOUNTER — Encounter: Payer: Self-pay | Admitting: Gastroenterology

## 2021-06-12 ENCOUNTER — Other Ambulatory Visit: Payer: Self-pay

## 2021-06-12 DIAGNOSIS — Z86010 Personal history of colonic polyps: Secondary | ICD-10-CM | POA: Insufficient documentation

## 2021-06-12 DIAGNOSIS — D122 Benign neoplasm of ascending colon: Secondary | ICD-10-CM | POA: Diagnosis not present

## 2021-06-12 DIAGNOSIS — K640 First degree hemorrhoids: Secondary | ICD-10-CM | POA: Diagnosis not present

## 2021-06-12 DIAGNOSIS — Z1211 Encounter for screening for malignant neoplasm of colon: Secondary | ICD-10-CM | POA: Diagnosis not present

## 2021-06-12 DIAGNOSIS — K5730 Diverticulosis of large intestine without perforation or abscess without bleeding: Secondary | ICD-10-CM | POA: Insufficient documentation

## 2021-06-12 DIAGNOSIS — K635 Polyp of colon: Secondary | ICD-10-CM

## 2021-06-12 HISTORY — PX: COLONOSCOPY: SHX5424

## 2021-06-12 HISTORY — PX: POLYPECTOMY: SHX5525

## 2021-06-12 SURGERY — COLONOSCOPY
Anesthesia: General | Site: Rectum

## 2021-06-12 MED ORDER — ONDANSETRON HCL 4 MG/2ML IJ SOLN: 4.0000 mg | Freq: Once | INTRAMUSCULAR | Status: DC | PRN

## 2021-06-12 MED ORDER — STERILE WATER FOR IRRIGATION IR SOLN: Status: DC | PRN

## 2021-06-12 MED ORDER — SODIUM CHLORIDE 0.9 % IV SOLN: INTRAVENOUS | Status: DC

## 2021-06-12 MED ORDER — LACTATED RINGERS IV SOLN: INTRAVENOUS | Status: DC

## 2021-06-12 MED ORDER — ACETAMINOPHEN 325 MG PO TABS: 325.0000 mg | ORAL_TABLET | ORAL | Status: DC | PRN

## 2021-06-12 MED ORDER — LIDOCAINE HCL (CARDIAC) PF 100 MG/5ML IV SOSY
PREFILLED_SYRINGE | INTRAVENOUS | Status: DC | PRN
  Administered 2021-06-12: 50 mg via INTRAVENOUS

## 2021-06-12 MED ORDER — PROPOFOL 10 MG/ML IV BOLUS
INTRAVENOUS | Status: DC | PRN
  Administered 2021-06-12 (×4): 20 mg via INTRAVENOUS
  Administered 2021-06-12: 80 mg via INTRAVENOUS

## 2021-06-12 MED ORDER — ACETAMINOPHEN 160 MG/5ML PO SOLN
325.0000 mg | ORAL | Status: DC | PRN
Start: 2021-06-12 — End: 2021-06-12

## 2021-06-12 SURGICAL SUPPLY — 8 items
GOWN CVR UNV OPN BCK APRN NK (MISCELLANEOUS) ×2 IMPLANT
GOWN ISOL THUMB LOOP REG UNIV (MISCELLANEOUS) ×4
KIT PRC NS LF DISP ENDO (KITS) ×1 IMPLANT
KIT PROCEDURE OLYMPUS (KITS) ×2
MANIFOLD NEPTUNE II (INSTRUMENTS) ×2 IMPLANT
SNARE COLD EXACTO (MISCELLANEOUS) ×2 IMPLANT
TRAP ETRAP POLY (MISCELLANEOUS) ×2 IMPLANT
WATER STERILE IRR 250ML POUR (IV SOLUTION) ×2 IMPLANT

## 2021-06-12 NOTE — Anesthesia Postprocedure Evaluation (Signed)
Anesthesia Post Note  Patient: Kenneth Brown  Procedure(s) Performed: COLONOSCOPY with BIOPSY (Rectum) POLYPECTOMY (Rectum)     Patient location during evaluation: PACU Anesthesia Type: General Level of consciousness: awake and alert Pain management: pain level controlled Vital Signs Assessment: post-procedure vital signs reviewed and stable Respiratory status: spontaneous breathing, nonlabored ventilation, respiratory function stable and patient connected to nasal cannula oxygen Cardiovascular status: blood pressure returned to baseline and stable Postop Assessment: no apparent nausea or vomiting Anesthetic complications: no   No notable events documented.  Sinda Du

## 2021-06-12 NOTE — H&P (Signed)
Lucilla Lame, MD [facility] [address], [place], [place] [address] Phone:[phone] Fax : [phone]  Primary Care Physician:  Ronnell Freshwater, NP Primary Gastroenterologist:  Dr. Allen Norris  Pre-Procedure History & Physical: HPI:  Kenneth Brown is a 70 y.o. male is here for an colonoscopy.   Past Medical History:  Diagnosis Date   Acute cystitis    Arthritis    ankle   Diabetes mellitus without complication ([place])    pre diabetic   Difficulty urinating    Gross hematuria    Platelets decreased (HCC)    Renal calculus, bilateral    UTI (urinary tract infection) 10/2014    Past Surgical History:  Procedure Laterality Date   ANKLE SURGERY  08/28/12   COLONOSCOPY WITH PROPOFOL N/A 07/08/2015   Procedure: COLONOSCOPY WITH PROPOFOL;  Surgeon: Lucilla Lame, MD;  Location: [place];  Service: Endoscopy;  Laterality: N/A;   ESOPHAGOGASTRODUODENOSCOPY (EGD) WITH PROPOFOL N/A 07/08/2015   Procedure: ESOPHAGOGASTRODUODENOSCOPY (EGD) WITH PROPOFOL;  Surgeon: Lucilla Lame, MD;  Location: [facility];  Service: Endoscopy;  Laterality: N/A;   FRACTURE SURGERY N/A    Phreesia 01/30/2021   JOINT REPLACEMENT N/A    Phreesia 01/30/2021   ORIF ANKLE FRACTURE Right    plate and screws, pt still has   POLYPECTOMY  07/08/2015   Procedure: POLYPECTOMY;  Surgeon: Lucilla Lame, MD;  Location: [facility];  Service: Endoscopy;;   URETEROSCOPY WITH HOLMIUM LASER LITHOTRIPSY Right 03/21/2015   Procedure: URETEROSCOPY WITH HOLMIUM LASER LITHOTRIPSY,retrograde pyelogram,stent placement;  Surgeon: Collier Flowers, MD;  Location: [facility];  Service: Urology;  Laterality: Right;    Prior to Admission medications   Medication Sig Start Date End Date Taking? Authorizing Provider  Ascorbic Acid (VITAMIN C) 1000 MG tablet Take 1,000 mg by mouth daily.   Yes [provider]  B Complex-C-Biotin-D-Zinc-FA (VITAL-D RX PO) Take 50 mg by mouth daily.   Yes [provider]  co-enzyme Q-10 30 MG capsule Take 30 mg by mouth 3 (three) times daily.   Yes [provider]    Allergies as of 05/16/2021   (No Known Allergies)    Family History  Problem Relation Age of Onset   Hypertension Mother    Heart disease Mother    Stroke Mother    COPD Mother    Heart disease Father    Heart attack Father    Lung cancer Father    Skin cancer Father     Social History   Socioeconomic History   Marital status: Married    Spouse name: Not on file   Number of children: 1   Years of education: Not on file   Highest education level: Some college, no degree  Occupational History   Occupation: part time Architect  Tobacco Use   Smoking status: Never   Smokeless tobacco: Never  Vaping Use   Vaping Use: Never used  Substance and Sexual Activity   Alcohol use: Yes    Alcohol/week: 0.0 - 1.0 standard drinks    Comment: 1-2 drinks a month   Drug use: No   Sexual activity: Yes  Other Topics Concern   Not on file  Social History Narrative   Not on file   Social Determinants of Health   Financial Resource Strain: Not on file  Food Insecurity: Not on file  Transportation Needs: Not on file  Physical Activity: Not on file  Stress: Not on file  Social Connections: Not on file  Intimate Partner Violence: Not on file    Review of Systems: See HPI, otherwise negative ROS  Physical Exam: BP (!) 160/97   Pulse 83   Temp 97.6 F (36.4 C) (Temporal)   Ht '5\' 9"'$  (1.753 m)   Wt 100.7 kg   SpO2 97%   BMI 32.78 kg/m  General:   Alert,  pleasant and cooperative in NAD Head:  Normocephalic and atraumatic. Neck:  Supple; no masses or thyromegaly. Lungs:  Clear throughout to auscultation.    Heart:  Regular rate and rhythm. Abdomen:  Soft, nontender and nondistended. Normal bowel sounds, without guarding, and without rebound.   Neurologic:  Alert and  oriented x4;  grossly normal neurologically.  Impression/Plan: Kenneth Brown is here for an colonoscopy to be performed for a history of adenomatous polyps on 2016   Risks, benefits, limitations, and alternatives regarding  colonoscopy have been reviewed with the patient.  Questions have been answered.  All parties agreeable.   Lucilla Lame, MD  06/12/2021, 9:49 AM

## 2021-06-12 NOTE — Transfer of Care (Signed)
Immediate Anesthesia Transfer of Care Note  Patient: Kenneth Brown  Procedure(s) Performed: COLONOSCOPY with BIOPSY (Rectum) POLYPECTOMY (Rectum)  Patient Location: PACU  Anesthesia Type: General  Level of Consciousness: awake, alert  and patient cooperative  Airway and Oxygen Therapy: Patient Spontanous Breathing and Patient connected to supplemental oxygen  Post-op Assessment: Post-op Vital signs reviewed, Patient's Cardiovascular Status Stable, Respiratory Function Stable, Patent Airway and No signs of Nausea or vomiting  Post-op Vital Signs: Reviewed and stable  Complications: No notable events documented.

## 2021-06-12 NOTE — Anesthesia Preprocedure Evaluation (Signed)
Anesthesia Evaluation  Patient identified by MRN, date of birth, ID band Patient awake    Reviewed: Allergy & Precautions, NPO status , Patient's Chart, lab work & pertinent test results, reviewed documented beta blocker date and time   History of Anesthesia Complications Negative for: history of anesthetic complications  Airway Mallampati: II  TM Distance: >3 FB Neck ROM: full    Dental no notable dental hx. 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    Peds  Hematology Low plts   Anesthesia Other Findings Plt=76;  Ekg: nsr;  Reproductive/Obstetrics                             Anesthesia Physical  Anesthesia Plan  ASA: III  Anesthesia Plan: General   Post-op Pain Management:    Induction:   PONV Risk Score and Plan: Propofol infusion and Treatment may vary due to age or medical condition  Airway Management Planned: Natural Airway and Nasal Cannula  Additional Equipment:   Intra-op Plan:   Post-operative Plan:   Informed Consent: I have reviewed the patients History and Physical, chart, labs and discussed the procedure including the risks, benefits and alternatives for the proposed anesthesia with the patient or authorized representative who has indicated his/her understanding and acceptance.     Dental advisory  given  Plan Discussed with: CRNA  Anesthesia Plan Comments:         Anesthesia Quick Evaluation  Patient Active Problem List   Diagnosis Date Noted  . Encounter for Medicare annual wellness exam 03/15/2021  . Mixed hyperlipidemia 03/15/2021  . Decreased platelet count ([place]) 03/15/2021  . Encounter to establish care 01/31/2021  . Tinnitus of both ears 01/31/2021  . Elevated blood-pressure reading without diagnosis of hypertension 01/31/2021  . Screening for colon cancer 01/31/2021  . Stiffness of right ankle joint 06/18/2019  . Lumbar radiculopathy 03/02/2019  . Bone cyst 08/29/2017  . 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 Primary localized osteoarthrosis of ankle and foot 05/28/2017  . B12 deficiency 05/07/2015  . Splenomegaly 05/06/2015  . Thrombocytopenia ([place]) 04/17/2015  . Abnormal liver enzymes 03/02/2015  . Allergic rhinitis 03/02/2015  . Blood pressure elevated 03/02/2015  . Borderline diabetes 03/02/2015  . History of colon polyps 03/02/2015  . Personal history of traumatic fracture 03/02/2015  . H/O disease 03/02/2015  . H/O renal calculi 03/02/2015  . Personal history of infectious and parasitic disease 03/02/2015  . HLD (hyperlipidemia) 03/02/2015  . Diabetes mellitus, type 2 ([place]) 03/02/2015  . Adiposity 03/02/2015  . Arthritis 10/12/2014    CBC Latest Ref Rng & Units 06/08/2021 03/09/2021 09/30/2018  WBC 3.4 - 10.8 x10E3/uL 5.5 5.0 9.4  Hemoglobin 13.0 - 17.7 g/dL 15.5 15.7 15.4  Hematocrit 37.5 - 51.0 % 45.4 46.6 46.4  Platelets 150 - 450 x10E3/uL 97(LL) 98(LL) 139(L)   BMP Latest Ref Rng & Units 06/08/2021 03/09/2021 02/25/2018  Glucose 65 - 99 mg/dL 145(H) 139(H) 118(H)  BUN 8 - 27 mg/dL '14 13 12  '$ Creatinine 0.76 - 1.27 mg/dL 0.91 1.15 1.04  BUN/Creat Ratio 10 - '24 15 11 12  '$ Sodium 134 - 144 mmol/L 141 141 137  Potassium 3.5 - 5.2 mmol/L 4.3 4.6 4.2  Chloride 96 - 106 mmol/L 102 103 100  CO2 20 - 29 mmol/L '25 24 21  '$ Calcium 8.6 - 10.2 mg/dL  9.3 9.5 9.1    Risks and benefits of anesthesia discussed at length, patient or surrogate demonstrates understanding. Appropriately NPO. Plan to proceed with anesthesia.  Champ Mungo, MD 06/12/21

## 2021-06-12 NOTE — Op Note (Signed)
[facility] Gastroenterology Patient Name: Kenneth Brown Procedure Date: 06/12/2021 10:12 AM MRN: [id number] Account #: [id number] Date of Birth: 1951-01-25 Admit Type: Outpatient Age: 70 Room: [facility] OR ROOM 01 Gender: Male Note Status: Finalized Procedure:             Colonoscopy Indications:           High risk colon cancer surveillance: Personal history                         of colonic polyps Providers:             Lucilla Lame MD, MD Referring MD:          Toniann Fail NP Medicines:             Propofol per Anesthesia Complications:         No immediate complications. Procedure:             Pre-Anesthesia Assessment:                        - Prior to the procedure, a History and Physical was                         performed, and patient medications and allergies were                         reviewed. The patient's tolerance of previous                         anesthesia was also reviewed. The risks and benefits                         of the procedure and the sedation options and risks                         were discussed with the patient. All questions were                         answered, and informed consent was obtained. Prior                         Anticoagulants: The patient has taken no previous                         anticoagulant or antiplatelet agents. ASA Grade                         Assessment: II - A patient with mild systemic disease.                         After reviewing the risks and benefits, the patient                         was deemed in satisfactory condition to undergo the                         procedure.                        After obtaining  informed consent, the colonoscope was                         passed under direct vision. Throughout the procedure,                         the patient's blood pressure, pulse, and oxygen                         saturations were monitored continuously. The was                          introduced through the anus and advanced to the the                         cecum, identified by appendiceal orifice and ileocecal                         valve. The colonoscopy was performed without                         difficulty. The patient tolerated the procedure well.                         The quality of the bowel preparation was good. Findings:      The perianal and digital rectal examinations were normal.      A 3 mm polyp was found in the descending colon. The polyp was sessile.       The polyp was removed with a cold snare. Resection and retrieval were       complete.      Multiple small-mouthed diverticula were found in the sigmoid colon and       descending colon.      Non-bleeding internal hemorrhoids were found during retroflexion. The       hemorrhoids were Grade I (internal hemorrhoids that do not prolapse). Impression:            - One 3 mm polyp in the descending colon, removed with                         a cold snare. Resected and retrieved.                        - Diverticulosis in the sigmoid colon and in the                         descending colon.                        - Non-bleeding internal hemorrhoids. Recommendation:        - Discharge patient to home.                        - Resume previous diet.                        - Continue present medications.                        - Await pathology results.                        -  Repeat colonoscopy in 7 years for surveillance. Procedure Code(s):     --- Professional ---                        [phone], Colonoscopy, flexible; with removal of                         tumor(s), polyp(s), or other lesion(s) by snare                         technique Diagnosis Code(s):     --- Professional ---                        Z86.010, Personal history of colonic polyps                        K63.5, Polyp of colon CPT copyright 2019 American Medical Association. All rights reserved. The codes documented in this report are  preliminary and upon coder review may  be revised to meet current compliance requirements. Lucilla Lame MD, MD 06/12/2021 10:37:14 AM This report has been signed electronically. Number of Addenda: 0 Note Initiated On: 06/12/2021 10:12 AM Scope Withdrawal Time: 0 hours 9 minutes 23 seconds  Total Procedure Duration: 0 hours 11 minutes 5 seconds  Estimated Blood Loss:  Estimated blood loss: none.      [facility]

## 2021-06-12 NOTE — Anesthesia Procedure Notes (Signed)
Date/Time: 06/12/2021 10:21 AM Performed by: Mayme Genta, CRNA Pre-anesthesia Checklist: Patient identified, Emergency Drugs available, Suction available, Timeout performed and Patient being monitored Patient Re-evaluated:Patient Re-evaluated prior to induction Oxygen Delivery Method: Nasal cannula Placement Confirmation: positive ETCO2

## 2021-06-13 ENCOUNTER — Encounter: Payer: Self-pay | Admitting: Gastroenterology

## 2021-06-13 LAB — SURGICAL PATHOLOGY

## 2021-06-13 NOTE — Progress Notes (Signed)
Repeat colonoscopy in 7 years for surveillance

## 2021-06-15 ENCOUNTER — Encounter: Payer: Self-pay | Admitting: Nurse Practitioner

## 2021-06-15 ENCOUNTER — Other Ambulatory Visit: Payer: Self-pay

## 2021-06-15 ENCOUNTER — Ambulatory Visit (INDEPENDENT_AMBULATORY_CARE_PROVIDER_SITE_OTHER): Payer: PPO | Admitting: Nurse Practitioner

## 2021-06-15 VITALS — BP 139/71 | HR 86 | Temp 99.0°F | Ht 69.0 in | Wt 231.9 lb

## 2021-06-15 DIAGNOSIS — E119 Type 2 diabetes mellitus without complications: Secondary | ICD-10-CM

## 2021-06-15 DIAGNOSIS — E782 Mixed hyperlipidemia: Secondary | ICD-10-CM | POA: Diagnosis not present

## 2021-06-15 DIAGNOSIS — D696 Thrombocytopenia, unspecified: Secondary | ICD-10-CM | POA: Diagnosis not present

## 2021-06-15 DIAGNOSIS — D124 Benign neoplasm of descending colon: Secondary | ICD-10-CM | POA: Diagnosis not present

## 2021-06-15 NOTE — Progress Notes (Signed)
Established Patient Office Visit  Subjective:  Patient ID: Kenneth Brown, male    DOB: 09/05/1951  Age: 70 y.o. MRN: [id number]  CC:  Chief Complaint  Patient presents with   Follow-up   Diabetes    HPI Kenneth Brown presents for follow-up visit.  Blood sugars are improving.  Continues to control through diet and physical activity.  His hemoglobin A1c is 8.4, down from 6.8 at most recent visit.  Lipid panel continues to be elevated.  Specifically, history glycerides are more elevated than on prior check.  Lipid Panel     Component Value Date/Time   CHOL 154 06/08/2021 0857   TRIG 482 (H) 06/08/2021 0857   HDL 30 (L) 06/08/2021 0857   CHOLHDL 5.1 (H) 06/08/2021 0857   LDLCALC 52 06/08/2021 0857   LABVLDL 72 (H) 06/08/2021 0857  Has been on atorvastatin in the past. Caused severe muscle and joint pain..  Had to stop taking.  Patient very reluctant to start different medication to help lower cholesterol.  Discussed increased risk for cardiovascular disease based on lipid panel.  Patient is currently taking co-Q10.  Advised him to add fish oil.  We will recheck fasting lipid panel in 4 months.  Discussed adding Zetia as opposed to statin if cholesterol still elevated after next visit.  He is agreeable to this plan. His platelet count remains very low.  This has been evaluated thoroughly per hematology.  We will continue to monitor routinely. Patient had screening colonoscopy since he was seen last.  There is a single 3 mm polyp that was removed from the colon.  A surveillance colonoscopy should be repeated in 7 years per colonoscopy report. Has no new concerns or complaints today.  He denies chest pain, chest pressure, or shortness of breath. He denies headaches or visual disturbances. He denies abdominal pain, nausea, vomiting, or changes in bowel or bladder habits.    Past Medical History:  Diagnosis Date   Acute cystitis    Arthritis    ankle   Diabetes mellitus  without complication ([place])    pre diabetic   Difficulty urinating    Gross hematuria    Platelets decreased (HCC)    Renal calculus, bilateral    UTI (urinary tract infection) 10/2014    Past Surgical History:  Procedure Laterality Date   ANKLE SURGERY  08/28/12   COLONOSCOPY N/A 06/12/2021   Procedure: COLONOSCOPY with BIOPSY;  Surgeon: Lucilla Lame, MD;  Location: [place];  Service: Endoscopy;  Laterality: N/A;   COLONOSCOPY WITH PROPOFOL N/A 07/08/2015   Procedure: COLONOSCOPY WITH PROPOFOL;  Surgeon: Lucilla Lame, MD;  Location: [employer] [facility];  Service: Endoscopy;  Laterality: N/A;   ESOPHAGOGASTRODUODENOSCOPY (EGD) WITH PROPOFOL N/A 07/08/2015   Procedure: ESOPHAGOGASTRODUODENOSCOPY (EGD) WITH PROPOFOL;  Surgeon: Lucilla Lame, MD;  Location: [place];  Service: Endoscopy;  Laterality: N/A;   FRACTURE SURGERY N/A    [employer] 01/30/2021   JOINT REPLACEMENT N/A    [employer] 01/30/2021   ORIF ANKLE FRACTURE Right    plate and screws, pt still has   POLYPECTOMY  07/08/2015   Procedure: POLYPECTOMY;  Surgeon: Lucilla Lame, MD;  Location: [place];  Service: Endoscopy;;   POLYPECTOMY N/A 06/12/2021   Procedure: POLYPECTOMY;  Surgeon: Lucilla Lame, MD;  Location: [place];  Service: Endoscopy;  Laterality: N/A;   URETEROSCOPY WITH HOLMIUM LASER LITHOTRIPSY Right 03/21/2015   Procedure: URETEROSCOPY WITH HOLMIUM LASER LITHOTRIPSY,retrograde pyelogram,stent placement;  Surgeon: Collier Flowers, MD;  Location: [facility];  Service: Urology;  Laterality: Right;    Family History  Problem Relation Age of Onset   Hypertension Mother    Heart disease Mother    Stroke Mother    COPD Mother    Heart disease Father    Heart attack Father    Lung cancer Father    Skin cancer Father     Social History   Socioeconomic History   Marital status: Married    Spouse name: Not on file   Number of children: 1   Years of education: Not on file   Highest  education level: Some college, no degree  Occupational History   Occupation: part time [employer] representative  Tobacco Use   Smoking status: Never   Smokeless tobacco: Never  Vaping Use   Vaping Use: Never used  Substance and Sexual Activity   Alcohol use: Yes    Alcohol/week: 0.0 - 1.0 standard drinks    Comment: 1-2 drinks a month   Drug use: No   Sexual activity: Yes  Other Topics Concern   Not on file  Social History Narrative   Not on file   Social Determinants of Health   Financial Resource Strain: Not on file  Food Insecurity: Not on file  Transportation Needs: Not on file  Physical Activity: Not on file  Stress: Not on file  Social Connections: Not on file  Intimate Partner Violence: Not on file    Outpatient Medications Prior to Visit  Medication Sig Dispense Refill   Ascorbic Acid (VITAMIN C) 1000 MG tablet Take 1,000 mg by mouth daily.     B Complex-C-Biotin-D-Zinc-FA (VITAL-D RX PO) Take 50 mg by mouth daily.     co-enzyme Q-10 30 MG capsule Take 30 mg by mouth 3 (three) times daily.     No facility-administered medications prior to visit.    No Known Allergies  ROS Review of Systems  Constitutional:  Negative for activity change, chills, fatigue and fever.  HENT:  Negative for congestion, hearing loss, sinus pain and tinnitus.   Eyes: Negative.   Respiratory:  Negative for cough, shortness of breath and wheezing.   Cardiovascular:  Negative for chest pain and palpitations.  Gastrointestinal:  Negative for abdominal pain, constipation, diarrhea, nausea and vomiting.  Endocrine: Negative for cold intolerance, heat intolerance, polydipsia and polyuria.       Improved hemoglobin A1c since most recent visit.  Musculoskeletal:  Negative for arthralgias, back pain and myalgias.  Skin:  Negative for rash.  Allergic/Immunologic: Negative.  Negative for environmental allergies.  Neurological:  Negative for dizziness, weakness and headaches.  Hematological:  Negative  for adenopathy.  Psychiatric/Behavioral:  Negative for dysphoric mood. The patient is not nervous/anxious.      Objective:    Physical Exam Vitals and nursing note reviewed.  Constitutional:      Appearance: Normal appearance. He is well-developed.  HENT:     Head: Normocephalic and atraumatic.     Nose: Nose normal.     Mouth/Throat:     Mouth: Mucous membranes are moist.  Eyes:     Extraocular Movements: Extraocular movements intact.     Conjunctiva/sclera: Conjunctivae normal.     Pupils: Pupils are equal, round, and reactive to light.  Neck:     Vascular: No carotid bruit.  Cardiovascular:     Rate and Rhythm: Normal rate and regular rhythm.     Pulses: Normal pulses.     Heart sounds: Normal heart sounds.  Pulmonary:  Effort: Pulmonary effort is normal.     Breath sounds: Normal breath sounds.  Abdominal:     Palpations: Abdomen is soft.  Musculoskeletal:        General: Normal range of motion.     Cervical back: Normal range of motion and neck supple.  Lymphadenopathy:     Cervical: No cervical adenopathy.  Skin:    General: Skin is warm and dry.     Capillary Refill: Capillary refill takes less than 2 seconds.  Neurological:     General: No focal deficit present.     Mental Status: He is alert and oriented to person, place, and time.  Psychiatric:        Mood and Affect: Mood normal.        Behavior: Behavior normal.        Thought Content: Thought content normal.        Judgment: Judgment normal.   Today's Vitals   06/15/21 1021 06/15/21 1106  BP: (!) 145/77 139/71  Pulse: 86   Temp: 99 F (37.2 C)   SpO2: 94%   Weight: 231 lb 14.4 oz (105.2 kg)   Height: $Remove'5\' 9"'SatSCKw$  (1.753 m)    Body mass index is 34.25 kg/m.   Wt Readings from Last 3 Encounters:  06/15/21 231 lb 14.4 oz (105.2 kg)  06/12/21 222 lb (100.7 kg)  03/15/21 234 lb 8 oz (106.4 kg)     Health Maintenance Due  Topic Date Due   Zoster Vaccines- Shingrix (1 of 2) Never done    OPHTHALMOLOGY EXAM  06/20/2018   INFLUENZA VACCINE  05/29/2021    There are no preventive care reminders to display for this patient.  Lab Results  Component Value Date   TSH 4.340 03/09/2021   Lab Results  Component Value Date   WBC 5.5 06/08/2021   HGB 15.5 06/08/2021   HCT 45.4 06/08/2021   MCV 90 06/08/2021   PLT 97 (LL) 06/08/2021   Lab Results  Component Value Date   NA 141 06/08/2021   K 4.3 06/08/2021   CO2 25 06/08/2021   GLUCOSE 145 (H) 06/08/2021   BUN 14 06/08/2021   CREATININE 0.91 06/08/2021   BILITOT 1.0 06/08/2021   ALKPHOS 85 06/08/2021   AST 51 (H) 06/08/2021   ALT 41 06/08/2021   PROT 6.7 06/08/2021   ALBUMIN 4.5 06/08/2021   CALCIUM 9.3 06/08/2021   ANIONGAP 7 03/08/2015   EGFR 90 06/08/2021   Lab Results  Component Value Date   CHOL 154 06/08/2021   Lab Results  Component Value Date   HDL 30 (L) 06/08/2021   Lab Results  Component Value Date   LDLCALC 52 06/08/2021   Lab Results  Component Value Date   TRIG 482 (H) 06/08/2021   Lab Results  Component Value Date   CHOLHDL 5.1 (H) 06/08/2021   Lab Results  Component Value Date   HGBA1C 6.4 (H) 06/08/2021      Assessment & Plan:  1. Type 2 diabetes mellitus without complication, without long-term current use of insulin (HCC) Most recent hemoglobin A1c is 6.4 down from 6.8 after most recent check.  She should continue to control through diet and exercise.  Recheck hemoglobin A1c at next visit.  2. Mixed hyperlipidemia Discussed increased risk for cardiovascular disease based on lipid panel.  Patient is currently taking co-Q10.  Advised him to add fish oil.  We will recheck fasting lipid panel in 4 months.  Discussed adding Zetia as opposed to  statin if cholesterol still elevated after next visit.  He is agreeable to this plan.  3. Decreased platelet count (HCC) Platelet count remains low but stable. Will continue to monitor   4. Adenomatous polyp of descending colon Reviewed  results of recent colonoscopy.  Single 3 mm polyp removed from the colon.  Surveillance colonoscopy to be done in 7 years per report.   Problem List Items Addressed This Visit       Digestive   Polyp of descending colon     Endocrine   Type 2 diabetes mellitus without complication, without long-term current use of insulin ([place]) - Primary     Other   Mixed hyperlipidemia   Decreased platelet count ([place])   This note was dictated using Systems analyst. Rapid proofreading was performed to expedite the delivery of the information. Despite proofreading, phonetic errors will occur which are common with this voice recognition software. Please take this into consideration. If there are any concerns, please contact our office.     Follow-up: Return in about 4 months (around 10/15/2021) for dm, hyperlipidemia - fasting lipids, CMP, and HgbA1c 1 week prior to visit .    Ronnell Freshwater, NP

## 2021-10-09 ENCOUNTER — Other Ambulatory Visit: Payer: PPO

## 2021-10-16 ENCOUNTER — Ambulatory Visit: Payer: PPO | Admitting: Nurse Practitioner

## 2021-11-01 ENCOUNTER — Encounter: Payer: Self-pay | Admitting: Hematology and Oncology

## 2021-11-02 ENCOUNTER — Ambulatory Visit (INDEPENDENT_AMBULATORY_CARE_PROVIDER_SITE_OTHER): Payer: PPO | Admitting: Family Medicine

## 2021-11-02 ENCOUNTER — Other Ambulatory Visit: Payer: Self-pay

## 2021-11-02 ENCOUNTER — Encounter: Payer: Self-pay | Admitting: Family Medicine

## 2021-11-02 VITALS — BP 142/88 | HR 86 | Ht 69.0 in | Wt 235.0 lb

## 2021-11-02 DIAGNOSIS — E782 Mixed hyperlipidemia: Secondary | ICD-10-CM | POA: Diagnosis not present

## 2021-11-02 DIAGNOSIS — D696 Thrombocytopenia, unspecified: Secondary | ICD-10-CM

## 2021-11-02 DIAGNOSIS — K7460 Unspecified cirrhosis of liver: Secondary | ICD-10-CM

## 2021-11-02 DIAGNOSIS — E119 Type 2 diabetes mellitus without complications: Secondary | ICD-10-CM | POA: Diagnosis not present

## 2021-11-02 NOTE — Progress Notes (Signed)
Subjective:    Patient ID: Kenneth Brown, male    DOB: 21-Sep-1951, 71 y.o.   MRN: [id number]  HPI  Patient is a very pleasant 71 year old Caucasian gentleman who presents today to establish care.  Past medical history is significant for cirrhosis.  This was seen on ultrasound of his liver.  He does have a protuberant abdomen a large diastases recti as well as an umbilical hernia.  There is no jaundice.  He was screened for hepatitis C all found to be negative for hepatitis C in May.  He denies any alcohol consumption.  Therefore I think that his cirrhosis is due to fatty liver.  He also has a history of borderline type 2 diabetes mellitus.  His last hemoglobin A1c was checked in August.  At that time his hemoglobin A1c was 6.4.  He is not on a statin.  His last LDL cholesterol was well below 100 in August.  He does have dyslipidemia however with a low HDL cholesterol.  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  His last platelet count was 97 in August.  His last colonoscopy was earlier this year and he did have 1 tubular adenoma.  They recommended a repeat colonoscopy in 7 years.  His last PSA was checked this year and was 0.8.  Reviewing his immunizations, he is due for Shingrix but the remainder of his immunizations are up-to-date. Immunization History  Administered Date(s) Administered   Fluad Quad(high Dose 65+) 08/10/2019   Influenza, High Dose Seasonal PF 10/03/2016, 08/26/2017, 09/03/2018   Influenza,inj,quad, With Preservative 07/29/2016   Moderna Sars-Covid-2 Vaccination 12/26/2019, 08/25/2020, 03/13/2021   Pneumococcal Conjugate-13 01/24/2016   Pneumococcal Polysaccharide-23 02/19/2017   Tdap 05/20/2013   Zoster, Live 05/20/2013   Past Medical History:  Diagnosis Date   Acute cystitis    Arthritis    ankle   Diabetes mellitus without complication ([place])    pre diabetic   Difficulty urinating    Gross hematuria    Platelets decreased ([place])    Renal  calculus, bilateral    UTI (urinary tract infection) 10/2014   Past Surgical History:  Procedure Laterality Date   ANKLE SURGERY  08/28/12   COLONOSCOPY N/A 06/12/2021   Procedure: COLONOSCOPY with BIOPSY;  Surgeon: Lucilla Lame, MD;  Location: [facility];  Service: Endoscopy;  Laterality: N/A;   COLONOSCOPY WITH PROPOFOL N/A 07/08/2015   Procedure: COLONOSCOPY WITH PROPOFOL;  Surgeon: Lucilla Lame, MD;  Location: [facility];  Service: Endoscopy;  Laterality: N/A;   ESOPHAGOGASTRODUODENOSCOPY (EGD) WITH PROPOFOL N/A 07/08/2015   Procedure: ESOPHAGOGASTRODUODENOSCOPY (EGD) WITH PROPOFOL;  Surgeon: Lucilla Lame, MD;  Location: [facility];  Service: Endoscopy;  Laterality: N/A;   FRACTURE SURGERY N/A    Phreesia 01/30/2021   JOINT REPLACEMENT N/A    Phreesia 01/30/2021   ORIF ANKLE FRACTURE Right    plate and screws, pt still has   POLYPECTOMY  07/08/2015   Procedure: POLYPECTOMY;  Surgeon: Lucilla Lame, MD;  Location: [facility];  Service: Endoscopy;;   POLYPECTOMY N/A 06/12/2021   Procedure: POLYPECTOMY;  Surgeon: Lucilla Lame, MD;  Location: [facility];  Service: Endoscopy;  Laterality: N/A;   URETEROSCOPY WITH HOLMIUM LASER LITHOTRIPSY Right 03/21/2015   Procedure: URETEROSCOPY WITH HOLMIUM LASER LITHOTRIPSY,retrograde pyelogram,stent placement;  Surgeon: Collier Flowers, MD;  Location: [facility];  Service: Urology;  Laterality: Right;   Current Outpatient Medications on File Prior to Visit  Medication Sig Dispense Refill  B Complex-C-Biotin-D-Zinc-FA (VITAL-D RX PO) Take 50 mg by mouth daily.     No current facility-administered medications on file prior to visit.   Marland Kitchenall Social History   Socioeconomic History   Marital status: Married    Spouse name: Not on file   Number of children: 1   Years of education: Not on file   Highest education level: Some college, no degree  Occupational History   Occupation: part time Architect  Tobacco Use    Smoking status: Never   Smokeless tobacco: Never  Vaping Use   Vaping Use: Never used  Substance and Sexual Activity   Alcohol use: Yes    Alcohol/week: 0.0 - 1.0 standard drinks    Comment: 1-2 drinks a month   Drug use: No   Sexual activity: Yes  Other Topics Concern   Not on file  Social History Narrative   Not on file   Social Determinants of Health   Financial Resource Strain: Not on file  Food Insecurity: Not on file  Transportation Needs: Not on file  Physical Activity: Not on file  Stress: Not on file  Social Connections: Not on file  Intimate Partner Violence: Not on file   Family History  Problem Relation Age of Onset   Hypertension Mother    Heart disease Mother    Stroke Mother    COPD Mother    Heart disease Father    Heart attack Father    Lung cancer Father    Skin cancer Father      Review of Systems  All other systems reviewed and are negative.     Objective:   Physical Exam Vitals reviewed.  Constitutional:      General: He is not in acute distress.    Appearance: Normal appearance. He is obese. He is not ill-appearing, toxic-appearing or diaphoretic.  HENT:     Head: Normocephalic and atraumatic.     Right Ear: Tympanic membrane and ear canal normal.     Left Ear: Tympanic membrane and ear canal normal.     Mouth/Throat:     Mouth: Mucous membranes are moist.     Pharynx: Oropharynx is clear. No oropharyngeal exudate or posterior oropharyngeal erythema.  Eyes:     Extraocular Movements: Extraocular movements intact.     Conjunctiva/sclera: Conjunctivae normal.     Pupils: Pupils are equal, round, and reactive to light.  Neck:     Vascular: No carotid bruit.  Cardiovascular:     Rate and Rhythm: Normal rate and regular rhythm.     Pulses: Normal pulses.     Heart sounds: Normal heart sounds. No murmur heard.   No friction rub. No gallop.  Pulmonary:     Effort: Pulmonary effort is normal. No respiratory distress.     Breath sounds:  Normal breath sounds. No wheezing, rhonchi or rales.  Abdominal:     General: There is distension.     Palpations: Abdomen is soft.     Tenderness: There is no abdominal tenderness. There is no guarding.     Hernia: A hernia is present.  Musculoskeletal:        General: No swelling.     Cervical back: Normal range of motion and neck supple. No tenderness.     Right lower leg: No edema.     Left lower leg: No edema.  Lymphadenopathy:     Cervical: No cervical adenopathy.  Skin:    Coloration: Skin is not jaundiced or pale.  Findings: No bruising, erythema, lesion or rash.  Neurological:     General: No focal deficit present.     Mental Status: He is alert and oriented to person, place, and time. Mental status is at baseline.     Cranial Nerves: No cranial nerve deficit.     Sensory: No sensory deficit.     Motor: No weakness.     Coordination: Coordination normal.     Gait: Gait normal.  Psychiatric:        Mood and Affect: Mood normal.        Behavior: Behavior normal.        Thought Content: Thought content normal.        Judgment: Judgment normal.          Assessment & Plan:   Type 2 diabetes mellitus without complication, without long-term current use of insulin (HCC) - Plan: Hemoglobin A1c, CBC with Differential/Platelet, COMPLETE METABOLIC PANEL WITH GFR, Lipid panel, Microalbumin, urine  Mixed hyperlipidemia - Plan: Hemoglobin A1c, CBC with Differential/Platelet, COMPLETE METABOLIC PANEL WITH GFR, Lipid panel, Microalbumin, urine  Decreased platelet count (HCC)  Cirrhosis of liver without ascites, unspecified hepatic cirrhosis type (HCC) -Has cirrhosis which I assume is due to fatty liver disease.  He denies any alcohol consumption.  He had negative hepatitis serologies in 2022.  Blood pressure today is borderline.  I will asked him to check his blood pressure at home regularly over the next week and report.  If greater than 140/90 I would add lisinopril.  He has  type 2 diabetes mellitus.  Goal A1c is less than 6.5.  Recheck A1c today along with microalbumin.  Diabetic foot exam normal.  Check fasting lipid panel.  Given his excellent LDL cholesterol, I will avoid statin due to cirrhosis unless his LDL cholesterol is well above 100.  Monitor his platelet count with a CBC.  Recommended the shingles vaccine.  The rest of his preventative care is up-to-date until August 2023

## 2021-11-03 ENCOUNTER — Other Ambulatory Visit: Payer: Self-pay | Admitting: *Deleted

## 2021-11-03 DIAGNOSIS — E119 Type 2 diabetes mellitus without complications: Secondary | ICD-10-CM

## 2021-11-03 LAB — COMPLETE METABOLIC PANEL WITH GFR
AG Ratio: 1.8 (calc) (ref 1.0–2.5)
ALT: 39 U/L (ref 9–46)
AST: 51 U/L — ABNORMAL HIGH (ref 10–35)
Albumin: 4.4 g/dL (ref 3.6–5.1)
Alkaline phosphatase (APISO): 82 U/L (ref 35–144)
BUN: 13 mg/dL (ref 7–25)
CO2: 26 mmol/L (ref 20–32)
Calcium: 9.3 mg/dL (ref 8.6–10.3)
Chloride: 102 mmol/L (ref 98–110)
Creat: 0.97 mg/dL (ref 0.70–1.28)
Globulin: 2.4 g/dL (calc) (ref 1.9–3.7)
Glucose, Bld: 161 mg/dL — ABNORMAL HIGH (ref 65–99)
Potassium: 4.1 mmol/L (ref 3.5–5.3)
Sodium: 136 mmol/L (ref 135–146)
Total Bilirubin: 1.2 mg/dL (ref 0.2–1.2)
Total Protein: 6.8 g/dL (ref 6.1–8.1)
eGFR: 83 mL/min/{1.73_m2} (ref >=60)

## 2021-11-03 LAB — CBC WITH DIFFERENTIAL/PLATELET
Absolute Monocytes: 281 cells/uL (ref 200–950)
Basophils Absolute: 21 cells/uL (ref 0–200)
Basophils Relative: 0.4 %
Eosinophils Absolute: 120 cells/uL (ref 15–500)
Eosinophils Relative: 2.3 %
HCT: 46 % (ref 38.5–50.0)
Hemoglobin: 15.4 g/dL (ref 13.2–17.1)
Lymphs Abs: 785 cells/uL — ABNORMAL LOW (ref 850–3900)
MCH: 30.4 pg (ref 27.0–33.0)
MCHC: 33.5 g/dL (ref 32.0–36.0)
MCV: 90.9 fL (ref 80.0–100.0)
Monocytes Relative: 5.4 %
Neutro Abs: 3994 cells/uL (ref 1500–7800)
Neutrophils Relative %: 76.8 %
RBC: 5.06 10*6/uL (ref 4.20–5.80)
RDW: 14.7 % (ref 11.0–15.0)
Total Lymphocyte: 15.1 %
WBC: 5.2 10*3/uL (ref 3.8–10.8)

## 2021-11-03 LAB — HEMOGLOBIN A1C
Hgb A1c MFr Bld: 6.9 % of total Hgb — ABNORMAL HIGH (ref <5.7)
Mean Plasma Glucose: 151 mg/dL
eAG (mmol/L): 8.4 mmol/L

## 2021-11-03 LAB — LIPID PANEL
Cholesterol: 141 mg/dL (ref <200)
HDL: 33 mg/dL — ABNORMAL LOW (ref >=40)
LDL Cholesterol (Calc): 69 mg/dL (calc)
Non-HDL Cholesterol (Calc): 108 mg/dL (calc) (ref <130)
Total CHOL/HDL Ratio: 4.3 (calc) (ref <5.0)
Triglycerides: 325 mg/dL — ABNORMAL HIGH (ref <150)

## 2021-11-03 LAB — MICROALBUMIN, URINE: Microalb, Ur: 4.6 mg/dL

## 2021-11-03 MED ORDER — EMPAGLIFLOZIN 25 MG PO TABS
25.0000 mg | ORAL_TABLET | Freq: Every day | ORAL | 0 refills | Status: DC
Start: 2021-11-03 — End: 2021-11-29

## 2021-11-06 ENCOUNTER — Telehealth: Payer: Self-pay

## 2021-11-06 NOTE — Telephone Encounter (Signed)
Patient says Kenneth Brown is $45 for a 30 day supply.  Advised him to contact insurance to see what alternatives are available.

## 2021-11-06 NOTE — Telephone Encounter (Signed)
Pt's spouse called in wanting to know if there was another med other than empagliflozin (JARDIANCE) 25 MG. Pt's spouse stated that pt would like to try another med that would be less expensive. Please advise.  Cb#: [phone]

## 2021-11-10 ENCOUNTER — Other Ambulatory Visit: Payer: Self-pay

## 2021-11-10 ENCOUNTER — Telehealth: Payer: Self-pay | Admitting: Family Medicine

## 2021-11-10 DIAGNOSIS — E119 Type 2 diabetes mellitus without complications: Secondary | ICD-10-CM

## 2021-11-10 MED ORDER — ONETOUCH DELICA LANCING DEV MISC: 1.0000 | 12 refills | Status: DC

## 2021-11-10 MED ORDER — ONETOUCH ULTRA VI STRP: ORAL_STRIP | 12 refills | Status: DC

## 2021-11-10 MED ORDER — BLOOD GLUCOSE MONITOR KIT: PACK | 0 refills | Status: DC

## 2021-11-10 NOTE — Telephone Encounter (Signed)
Rx's sent to pharmacy as requested. 

## 2021-11-10 NOTE — Telephone Encounter (Signed)
Received call from Allen at [facility] to follow up on RX received for blood glucose meter kit and supplies.  Pharmacy requesting scripts for the following which are covered by patient's insurance:   - One Touch Ultra test strips  - One Touch Delica Lancets **Both come in quantities of 100.  Please advise at [phone].

## 2021-11-29 ENCOUNTER — Other Ambulatory Visit: Payer: Self-pay

## 2021-11-29 ENCOUNTER — Telehealth: Payer: Self-pay

## 2021-11-29 DIAGNOSIS — E119 Type 2 diabetes mellitus without complications: Secondary | ICD-10-CM

## 2021-11-29 MED ORDER — EMPAGLIFLOZIN 25 MG PO TABS: 25.0000 mg | ORAL_TABLET | Freq: Every day | ORAL | 3 refills | Status: DC

## 2021-11-29 NOTE — Telephone Encounter (Signed)
Rx was sent with no refills. Rx has been updated and sent to pharmacy.   Pt's wife has been advised. Nothing further needed at this time.

## 2021-11-29 NOTE — Telephone Encounter (Signed)
Pt's wife called in wanting to know if they should schedule an appt with pcp as the prescription for (JARDIANCE) 25 MG is almost out and does not have any refills. Pt's wife is concerned if pt runs out of this med. Please advise.  Cb#: [phone]

## 2022-03-21 NOTE — Patient Instructions (Incomplete)
[name] , Thank you for taking time to come for your Medicare Wellness Visit. I appreciate your ongoing commitment to your health goals. Please review the following plan we discussed and let me know if I can assist you in the future.   Screening recommendations/referrals: Colonoscopy: Done 06/12/2021. Repeat every 7 years.  Recommended yearly ophthalmology/optometry visit for glaucoma screening and checkup Recommended yearly dental visit for hygiene and checkup  Vaccinations: Influenza vaccine: Due Fall 2023. Pneumococcal vaccine: Done 01/24/2016 and 02/19/2017 Tdap vaccine: Done 05/20/2013 Repeat in 10 years  Shingles vaccine: Done 05/20/2013. Shingrix discussed.   Covid-19: Done 03/13/2021, 08/25/2020, 12/26/2019.  Advanced directives: Copy scanned into chart.  Conditions/risks identified: Aim for 30 minutes of exercise or brisk walking, 6-8 glasses of water, and 5 servings of fruits and vegetables each day.   Next appointment: Follow up in one year for your annual wellness visit. 2024.  Preventive Care 6 Years and Older, Male  Preventive care refers to lifestyle choices and visits with your health care provider that can promote health and wellness. What does preventive care include? A yearly physical exam. This is also called an annual well check. Dental exams once or twice a year. Routine eye exams. Ask your health care provider how often you should have your eyes checked. Personal lifestyle choices, including: Daily care of your teeth and gums. Regular physical activity. Eating a healthy diet. Avoiding tobacco and drug use. Limiting alcohol use. Practicing safe sex. Taking low doses of aspirin every day. Taking vitamin and mineral supplements as recommended by your health care provider. What happens during an annual well check? The services and screenings done by your health care provider during your annual well check will depend on your age, overall health, lifestyle risk  factors, and family history of disease. Counseling  Your health care provider may ask you questions about your: Alcohol use. Tobacco use. Drug use. Emotional well-being. Home and relationship well-being. Sexual activity. Eating habits. History of falls. Memory and ability to understand (cognition). Work and work Statistician. Screening  You may have the following tests or measurements: Height, weight, and BMI. Blood pressure. Lipid and cholesterol levels. These may be checked every 5 years, or more frequently if you are over 90 years old. Skin check. Lung cancer screening. You may have this screening every year starting at age 59 if you have a 30-pack-year history of smoking and currently smoke or have quit within the past 15 years. Fecal occult blood test (FOBT) of the stool. You may have this test every year starting at age 27. Flexible sigmoidoscopy or colonoscopy. You may have a sigmoidoscopy every 5 years or a colonoscopy every 10 years starting at age 51. Prostate cancer screening. Recommendations will vary depending on your family history and other risks. Hepatitis C blood test. Hepatitis B blood test. Sexually transmitted disease (STD) testing. Diabetes screening. This is done by checking your blood sugar (glucose) after you have not eaten for a while (fasting). You may have this done every 1-3 years. Abdominal aortic aneurysm (AAA) screening. You may need this if you are a current or former smoker. Osteoporosis. You may be screened starting at age 19 if you are at high risk. Talk with your health care provider about your test results, treatment options, and if necessary, the need for more tests. Vaccines  Your health care provider may recommend certain vaccines, such as: Influenza vaccine. This is recommended every year. Tetanus, diphtheria, and acellular pertussis (Tdap, Td) vaccine. You may need a  Td booster every 10 years. Zoster vaccine. You may need this after age  90. Pneumococcal 13-valent conjugate (PCV13) vaccine. One dose is recommended after age 16. Pneumococcal polysaccharide (PPSV23) vaccine. One dose is recommended after age 69. Talk to your health care provider about which screenings and vaccines you need and how often you need them. This information is not intended to replace advice given to you by your health care provider. Make sure you discuss any questions you have with your health care provider. Document Released: 11/11/2015 Document Revised: 07/04/2016 Document Reviewed: 08/16/2015 Elsevier Interactive Patient Education  2017 Kenneth Brown Prevention in the Home Falls can cause injuries. They can happen to people of all ages. There are many things you can do to make your home safe and to help prevent falls. What can I do on the outside of my home? Regularly fix the edges of walkways and driveways and fix any cracks. Remove anything that might make you trip as you walk through a door, such as a raised step or threshold. Trim any bushes or trees on the path to your home. Use bright outdoor lighting. Clear any walking paths of anything that might make someone trip, such as rocks or tools. Regularly check to see if handrails are loose or broken. Make sure that both sides of any steps have handrails. Any raised decks and porches should have guardrails on the edges. Have any leaves, snow, or ice cleared regularly. Use sand or salt on walking paths during winter. Clean up any spills in your garage right away. This includes oil or grease spills. What can I do in the bathroom? Use night lights. Install grab bars by the toilet and in the tub and shower. Do not use towel bars as grab bars. Use non-skid mats or decals in the tub or shower. If you need to sit down in the shower, use a plastic, non-slip stool. Keep the floor dry. Clean up any water that spills on the floor as soon as it happens. Remove soap buildup in the tub or shower  regularly. Attach bath mats securely with double-sided non-slip rug tape. Do not have throw rugs and other things on the floor that can make you trip. What can I do in the bedroom? Use night lights. Make sure that you have a light by your bed that is easy to reach. Do not use any sheets or blankets that are too big for your bed. They should not hang down onto the floor. Have a firm chair that has side arms. You can use this for support while you get dressed. Do not have throw rugs and other things on the floor that can make you trip. What can I do in the kitchen? Clean up any spills right away. Avoid walking on wet floors. Keep items that you use a lot in easy-to-reach places. If you need to reach something above you, use a strong step stool that has a grab bar. Keep electrical cords out of the way. Do not use floor polish or wax that makes floors slippery. If you must use wax, use non-skid floor wax. Do not have throw rugs and other things on the floor that can make you trip. What can I do with my stairs? Do not leave any items on the stairs. Make sure that there are handrails on both sides of the stairs and use them. Fix handrails that are broken or loose. Make sure that handrails are as long as the stairways. Check any  carpeting to make sure that it is firmly attached to the stairs. Fix any carpet that is loose or worn. Avoid having throw rugs at the top or bottom of the stairs. If you do have throw rugs, attach them to the floor with carpet tape. Make sure that you have a light switch at the top of the stairs and the bottom of the stairs. If you do not have them, ask someone to add them for you. What else can I do to help prevent falls? Wear shoes that: Do not have high heels. Have rubber bottoms. Are comfortable and fit you well. Are closed at the toe. Do not wear sandals. If you use a stepladder: Make sure that it is fully opened. Do not climb a closed stepladder. Make sure that  both sides of the stepladder are locked into place. Ask someone to hold it for you, if possible. Clearly mark and make sure that you can see: Any grab bars or handrails. First and last steps. Where the edge of each step is. Use tools that help you move around (mobility aids) if they are needed. These include: Canes. Walkers. Scooters. Crutches. Turn on the lights when you go into a dark area. Replace any light bulbs as soon as they burn out. Set up your furniture so you have a clear path. Avoid moving your furniture around. If any of your floors are uneven, fix them. If there are any pets around you, be aware of where they are. Review your medicines with your doctor. Some medicines can make you feel dizzy. This can increase your chance of falling. Ask your doctor what other things that you can do to help prevent falls. This information is not intended to replace advice given to you by your health care provider. Make sure you discuss any questions you have with your health care provider. Document Released: 08/11/2009 Document Revised: 03/22/2016 Document Reviewed: 11/19/2014 Elsevier Interactive Patient Education  2017 [employer].

## 2022-03-22 ENCOUNTER — Ambulatory Visit (INDEPENDENT_AMBULATORY_CARE_PROVIDER_SITE_OTHER): Payer: PPO

## 2022-03-22 VITALS — BP 128/76 | HR 70 | Ht 69.0 in | Wt 211.0 lb

## 2022-03-22 DIAGNOSIS — Z0000 Encounter for general adult medical examination without abnormal findings: Secondary | ICD-10-CM

## 2022-03-22 NOTE — Progress Notes (Signed)
Subjective:   Kenneth Brown is a 71 y.o. male who presents for Medicare Annual/Subsequent preventive examination.  Review of Systems     Cardiac Risk Factors include: advanced age (>108men, >52 women);diabetes mellitus;hypertension;dyslipidemia;male gender;sedentary lifestyle;obesity (BMI >30kg/m2)     Objective:    Today's Vitals   03/22/22 0850  BP: 128/76  Pulse: 70  SpO2: 99%  Weight: 211 lb (95.7 kg)  Height: $Remove'5\' 9"'ANItkrc$  (1.753 m)   Body mass index is 31.16 kg/m.     03/22/2022    9:56 AM 06/12/2021    9:23 AM 01/08/2018    8:51 AM 02/19/2017    1:13 PM 06/14/2016   10:00 AM 01/24/2016   10:23 AM 08/26/2015    2:19 PM  Advanced Directives  Does Patient Have a Medical Advance Directive? Yes Yes Yes Yes Yes Yes No  Type of Paramedic of [place];Living will [place];Living will [place];Living will Living will [place];Living will [place];Living will   Does patient want to make changes to medical advance directive?  No - Patient declined       Copy of [place] in Chart? Yes - validated most recent copy scanned in chart (See row information) Yes - validated most recent copy scanned in chart (See row information) No - copy requested      Would patient like information on creating a medical advance directive?       No - patient declined information    Current Medications (verified) Outpatient Encounter Medications as of 03/22/2022  Medication Sig   B Complex-C-Biotin-D-Zinc-FA (VITAL-D RX PO) Take 50 mg by mouth daily.   blood glucose meter kit and supplies KIT Dispense based on patient and insurance preference. Use up to four times daily as directed.   empagliflozin (JARDIANCE) 25 MG TABS tablet Take 1 tablet (25 mg total) by mouth daily before breakfast.   glucose blood (ONETOUCH ULTRA) test strip Use as instructed   Lancet Devices (ONE TOUCH DELICA  LANCING DEV) MISC 1 each by Does not apply route as directed.   Lancets (ONETOUCH DELICA PLUS [id number]) MISC Apply 1 each topically 4 (four) times daily.   No facility-administered encounter medications on file as of 03/22/2022.    Allergies (verified) Patient has no known allergies.   History: Past Medical History:  Diagnosis Date   Acute cystitis    Arthritis    ankle   Diabetes mellitus without complication ([place])    pre diabetic   Difficulty urinating    Gross hematuria    Platelets decreased ([facility])    Renal calculus, bilateral    UTI (urinary tract infection) 10/2014   Past Surgical History:  Procedure Laterality Date   ANKLE SURGERY  08/28/12   COLONOSCOPY N/A 06/12/2021   Procedure: COLONOSCOPY with BIOPSY;  Surgeon: Lucilla Lame, MD;  Location: [facility];  Service: Endoscopy;  Laterality: N/A;   COLONOSCOPY WITH PROPOFOL N/A 07/08/2015   Procedure: COLONOSCOPY WITH PROPOFOL;  Surgeon: Lucilla Lame, MD;  Location: [facility];  Service: Endoscopy;  Laterality: N/A;   ESOPHAGOGASTRODUODENOSCOPY (EGD) WITH PROPOFOL N/A 07/08/2015   Procedure: ESOPHAGOGASTRODUODENOSCOPY (EGD) WITH PROPOFOL;  Surgeon: Lucilla Lame, MD;  Location: [facility];  Service: Endoscopy;  Laterality: N/A;   FRACTURE SURGERY N/A    Phreesia 01/30/2021   JOINT REPLACEMENT N/A    Phreesia 01/30/2021   ORIF ANKLE FRACTURE Right    plate and screws, pt still  has   POLYPECTOMY  07/08/2015   Procedure: POLYPECTOMY;  Surgeon: Lucilla Lame, MD;  Location: [facility];  Service: Endoscopy;;   POLYPECTOMY N/A 06/12/2021   Procedure: POLYPECTOMY;  Surgeon: Lucilla Lame, MD;  Location: [facility];  Service: Endoscopy;  Laterality: N/A;   URETEROSCOPY WITH HOLMIUM LASER LITHOTRIPSY Right 03/21/2015   Procedure: URETEROSCOPY WITH HOLMIUM LASER LITHOTRIPSY,retrograde pyelogram,stent placement;  Surgeon: Collier Flowers, MD;  Location: [facility];  Service: Urology;  Laterality: Right;    Family History  Problem Relation Age of Onset   Hypertension Mother    Heart disease Mother    Stroke Mother    COPD Mother    Heart disease Father    Heart attack Father    Lung cancer Father    Skin cancer Father    Social History   Socioeconomic History   Marital status: Married    Spouse name: Not on file   Number of children: 1   Years of education: Not on file   Highest education level: Some college, no degree  Occupational History   Occupation: part time Architect  Tobacco Use   Smoking status: Never   Smokeless tobacco: Never  Vaping Use   Vaping Use: Never used  Substance and Sexual Activity   Alcohol use: Yes    Alcohol/week: 0.0 - 1.0 standard drinks    Comment: 1-2 drinks a month   Drug use: No   Sexual activity: Yes  Other Topics Concern   Not on file  Social History Narrative   Not on file   Social Determinants of Health   Financial Resource Strain: Low Risk    Difficulty of Paying Living Expenses: Not very hard  Food Insecurity: No Food Insecurity   Worried About Running Out of Food in the Last Year: Never true   Ran Out of Food in the Last Year: Never true  Transportation Needs: No Transportation Needs   Lack of Transportation (Medical): No   Lack of Transportation (Non-Medical): No  Physical Activity: Sufficiently Active   Days of Exercise per Week: 5 days   Minutes of Exercise per Session: 30 min  Stress: No Stress Concern Present   Feeling of Stress : Not at all  Social Connections: Socially Integrated   Frequency of Communication with Friends and Family: More than three times a week   Frequency of Social Gatherings with Friends and Family: More than three times a week   Attends Religious Services: More than 4 times per year   Active Member of [employer] or Organizations: Yes   Attends Music therapist: More than 4 times per year   Marital Status: Married    Tobacco Counseling Counseling given: Not Answered   Clinical  Intake:  Pre-visit preparation completed: Yes  Pain : No/denies pain     BMI - recorded: 31.16 Nutritional Status: BMI > 30  Obese Nutritional Risks: None Diabetes: Yes  How often do you need to have someone help you when you read instructions, pamphlets, or other written materials from your doctor or pharmacy?: 1 - Never  Diabetic?Nutrition Risk Assessment:  Has the patient had any N/V/D within the last 2 months?  No  Does the patient have any non-healing wounds?  No  Has the patient had any unintentional weight loss or weight gain?  No   Diabetes:  Is the patient diabetic?  Yes  If diabetic, was a CBG obtained today?  Yes  Did the patient bring in their glucometer from  home?  No  How often do you monitor your CBG's? Daily.   Financial Strains and Diabetes Management:  Are you having any financial strains with the device, your supplies or your medication? No .  Does the patient want to be seen by Chronic Care Management for management of their diabetes?  No  Would the patient like to be referred to a Nutritionist or for Diabetic Management?  No   Diabetic Exams:  Diabetic Eye Exam: Completed 2022.  Pt has been advised about the importance in completing this exam. Diabetic Foot Exam: Completed 11/02/2021. Pt has been advised about the importance in completing this exam.   Interpreter Needed?: No  Information entered by :: mj Hosanna Betley, lpn   Activities of Daily Living    03/22/2022    9:57 AM 06/15/2021   10:25 AM  In your present state of health, do you have any difficulty performing the following activities:  Hearing? 0 0  Vision? 0 0  Difficulty concentrating or making decisions? 0 0  Walking or climbing stairs? 0 0  Dressing or bathing? 0 0  Doing errands, shopping? 0 0  Preparing Food and eating ? N   Using the Toilet? N   In the past six months, have you accidently leaked urine? N   Do you have problems with loss of bowel control? N   Managing your  Medications? N   Managing your Finances? N   Housekeeping or managing your Housekeeping? N     Patient Care Team: Susy Frizzle, MD as PCP - General (Family Medicine) Lucilla Lame, MD as Consulting Physician (Gastroenterology) Lorelee Cover., MD as Consulting Physician (Ophthalmology) Diona Browner, Paulino Door, MD as Referring Physician (Orthopedic Surgery)  Indicate any recent Medical Services you may have received from other than [facility] providers in the past year (date may be approximate).     Assessment:   This is a routine wellness examination for Aahil.  Hearing/Vision screen Hearing Screening - Comments:: No hearing issues.  Vision Screening - Comments:: Readers. 2022.  Dietary issues and exercise activities discussed: Current Exercise Habits: Home exercise routine, Time (Minutes): 30, Frequency (Times/Week): 5, Weekly Exercise (Minutes/Week): 150, Intensity: Mild, Exercise limited by: cardiac condition(s)   Goals Addressed             This Visit's Progress    DIET - INCREASE WATER INTAKE   On track    Continue drinking 6-8 glasses of water a day and current diet regimen.       Exercise    On track    Recommend walking 3 days a week for 20-30 minutes.        Depression Screen    03/22/2022    9:54 AM 06/15/2021   10:24 AM 03/15/2021    9:52 AM 01/31/2021    9:16 AM 02/25/2018    9:22 AM 01/08/2018    8:52 AM 02/19/2017    1:13 PM  PHQ 2/9 Scores  PHQ - 2 Score 0 0 0 0 1 0 0  PHQ- 9 Score  0 1 1       Fall Risk    03/22/2022    9:56 AM 06/15/2021   10:24 AM 03/15/2021    9:51 AM 01/31/2021    9:16 AM 06/01/2019   12:10 PM  Fall Risk   Falls in the past year? 0 0 0 0 0  Comment     Emmi Telephone Survey: data to providers prior to load  Number falls in past  yr: 0 0 0    Injury with Fall? 0 0 0    Risk for fall due to : No Fall Risks      Follow up Falls prevention discussed Falls evaluation completed Falls evaluation completed Falls evaluation completed      [place]:  Any stairs in or around the home? No  If so, are there any without handrails? No  Home free of loose throw rugs in walkways, pet beds, electrical cords, etc? Yes  Adequate lighting in your home to reduce risk of falls? Yes   ASSISTIVE DEVICES UTILIZED TO PREVENT FALLS:  Life alert? No  Use of a cane, walker or w/c? No  Grab bars in the bathroom? Yes  Shower chair or bench in shower? Yes  Elevated toilet seat or a handicapped toilet? Yes   TIMED UP AND GO:  Was the test performed? Yes .  Length of time to ambulate 10 feet: 10 sec.   Gait steady and fast without use of assistive device  Cognitive Function:        03/22/2022    9:59 AM 03/15/2021    9:48 AM 02/19/2017    1:17 PM  6CIT Screen  What Year? 0 points 0 points 0 points  What month? 0 points 0 points 0 points  What time? 0 points 0 points 0 points  Count back from 20 0 points 0 points 0 points  Months in reverse 0 points 2 points 0 points  Repeat phrase 2 points 0 points 0 points  Total Score 2 points 2 points 0 points    Immunizations Immunization History  Administered Date(s) Administered   Fluad Quad(high Dose 65+) 08/10/2019   Influenza, High Dose Seasonal PF 10/03/2016, 08/26/2017, 09/03/2018   Influenza,inj,quad, With Preservative 07/29/2016   Moderna Sars-Covid-2 Vaccination 12/26/2019, 08/25/2020, 03/13/2021   Pneumococcal Conjugate-13 01/24/2016   Pneumococcal Polysaccharide-23 02/19/2017   Tdap 05/20/2013   Zoster, Live 05/20/2013    TDAP status: Up to date  Flu Vaccine status: Up to date  Pneumococcal vaccine status: Up to date  Covid-19 vaccine status: Completed vaccines  Qualifies for Shingles Vaccine? Yes   Zostavax completed Yes   Shingrix Completed?: No.    Education has been provided regarding the importance of this vaccine. Patient has been advised to call insurance company to determine out of pocket expense if they have not yet  received this vaccine. Advised may also receive vaccine at local pharmacy or Health Dept. Verbalized acceptance and understanding.  Screening Tests Health Maintenance  Topic Date Due   Zoster Vaccines- Shingrix (1 of 2) Never done   OPHTHALMOLOGY EXAM  06/20/2018   COVID-19 Vaccine (4 - Booster for Moderna series) 05/08/2021   HEMOGLOBIN A1C  05/02/2022   INFLUENZA VACCINE  05/29/2022   FOOT EXAM  11/02/2022   URINE MICROALBUMIN  11/02/2022   TETANUS/TDAP  05/21/2023   COLONOSCOPY (Pts 45-46yrs Insurance coverage will need to be confirmed)  06/12/2028   Pneumonia Vaccine 69+ Years old  Completed   Hepatitis C Screening  Completed   HPV VACCINES  Aged Out    Health Maintenance  Health Maintenance Due  Topic Date Due   Zoster Vaccines- Shingrix (1 of 2) Never done   OPHTHALMOLOGY EXAM  06/20/2018   COVID-19 Vaccine (4 - Booster for Moderna series) 05/08/2021    Colorectal cancer screening: Type of screening: Colonoscopy. Completed 06/12/2021. Repeat every 7 years  Lung Cancer Screening: (Low Dose CT Chest recommended if  Age 59-80 years, 30 pack-year currently smoking OR have quit w/in 15years.) does not qualify.  Additional Screening:  Hepatitis C Screening: does qualify; Completed 03/09/2021  Vision Screening: Recommended annual ophthalmology exams for early detection of glaucoma and other disorders of the eye. Is the patient up to date with their annual eye exam?  Yes  Who is the provider or what is the name of the office in which the patient attends annual eye exams? Dr. Gloriann Loan If pt is not established with a provider, would they like to be referred to a provider to establish care? No .   Dental Screening: Recommended annual dental exams for proper oral hygiene  Community Resource Referral / Chronic Care Management: CRR required this visit?  No   CCM required this visit?  No      Plan:     I have personally reviewed and noted the following in the patient's chart:    Medical and social history Use of alcohol, tobacco or illicit drugs  Current medications and supplements including opioid prescriptions. Patient is not currently taking opioid prescriptions. Functional ability and status Nutritional status Physical activity Advanced directives List of other physicians Hospitalizations, surgeries, and ER visits in previous 12 months Vitals Screenings to include cognitive, depression, and falls Referrals and appointments  In addition, I have reviewed and discussed with patient certain preventive protocols, quality metrics, and best practice recommendations. A written personalized care plan for preventive services as well as general preventive health recommendations were provided to patient.     Chriss Driver, LPN   12/17/7586   Nurse Notes: Discussed Shingrix and how to obtain.

## 2022-06-05 ENCOUNTER — Telehealth: Payer: Self-pay

## 2022-06-05 DIAGNOSIS — Z789 Other specified health status: Secondary | ICD-10-CM

## 2022-06-05 DIAGNOSIS — Z599 Problem related to housing and economic circumstances, unspecified: Secondary | ICD-10-CM

## 2022-06-05 DIAGNOSIS — E119 Type 2 diabetes mellitus without complications: Secondary | ICD-10-CM

## 2022-06-05 NOTE — Telephone Encounter (Signed)
Pt's wife, Jenny Reichmann, called yesterday and they are having issues with paying for pt's Jardiance '25mg'$ . I did give them a month's worth of samples to get them through, would you be able to help them with this? Thank you!

## 2022-06-05 NOTE — Telephone Encounter (Signed)
Hey I can certainly try.  If you can have a referral placed for Pharmacist with CCM I can get them scheduled!

## 2022-06-05 NOTE — Telephone Encounter (Signed)
Pt's wife, Jenny Reichmann called yesterday and pt is having issues with affording Jardiance 25 mg. Can I enter a CCM order for Pharmacy, [facility] Me[facility] to see if they qualify for assistance. I did give them some samples to help cover them until assistance could be established. Thank you.

## 2022-06-21 ENCOUNTER — Telehealth: Payer: Self-pay | Admitting: Family Medicine

## 2022-06-21 ENCOUNTER — Other Ambulatory Visit: Payer: Self-pay

## 2022-06-21 LAB — HM DIABETES EYE EXAM

## 2022-06-21 NOTE — Progress Notes (Signed)
  Chronic Care Management   Note  06/21/2022 Name: Kenneth Brown MRN: [id number] DOB: [birth year]  Kenneth Brown is a 70 y.o. year old male who is a primary care patient of Susy Frizzle, MD. I reached out to Koren Shiver by phone today in response to a referral sent by Kenneth Brown PCP, Susy Frizzle, MD.   [name] was given information about Chronic Care Management services today including:  CCM service includes personalized support from designated clinical staff supervised by his physician, including individualized plan of care and coordination with other care providers 24/7 contact phone numbers for assistance for urgent and routine care needs. Service will only be billed when office clinical staff spend 20 minutes or more in a month to coordinate care. Only one practitioner may furnish and bill the service in a calendar month. The patient may stop CCM services at any time (effective at the end of the month) by phone call to the office staff.   Patient agreed to services and verbal consent obtained.   Follow up plan: REFERRAL/ NO COPAY   Tatjana Dellinger Upstream Scheduler

## 2022-07-05 ENCOUNTER — Telehealth: Payer: Self-pay | Admitting: Pharmacist

## 2022-07-05 NOTE — Chronic Care Management (AMB) (Signed)
  Chronic Care Management   Outreach Note  07/05/2022 Name: Kenneth Brown MRN: [id number] DOB: 26-Dec-1950  Referred by: Susy Frizzle, MD Reason for referral : No chief complaint on file.  Patient approved for Jardiance patient assistance through 10/28/22.  His medication was received on 07/05/22.  Beverly Milch, PharmD, CPP Clinical Pharmacist Practitioner [place] [phone]

## 2022-07-12 ENCOUNTER — Ambulatory Visit (INDEPENDENT_AMBULATORY_CARE_PROVIDER_SITE_OTHER): Payer: PPO | Admitting: Pharmacist

## 2022-07-12 DIAGNOSIS — E782 Mixed hyperlipidemia: Secondary | ICD-10-CM

## 2022-07-12 DIAGNOSIS — E119 Type 2 diabetes mellitus without complications: Secondary | ICD-10-CM

## 2022-07-12 NOTE — Patient Instructions (Addendum)
Visit Information   Goals Addressed             This Visit's Progress    Monitor and Manage My Blood Sugar-Diabetes Type 2       Timeframe:  Long-Range Goal Priority:  High Start Date: 07/12/22                             Expected End Date: 01/09/22                       Follow Up Date 10/11/22    - check blood sugar at prescribed times    Why is this important?   Checking your blood sugar at home helps to keep it from getting very high or very low.  Writing the results in a diary or log helps the doctor know how to care for you.  Your blood sugar log should have the time, date and the results.  Also, write down the amount of insulin or other medicine that you take.  Other information, like what you ate, exercise done and how you were feeling, will also be helpful.     Notes:        Patient Care Plan: General Pharmacy (Adult)     Problem Identified: DM, HLD   Priority: High     Long-Range Goal: Patient-Specific Goal   Start Date: 07/12/2022  Expected End Date: 01/10/2023  This Visit's Progress: On track  Priority: High  Note:   Current Barriers:  Jardiance copay  Pharmacist Clinical Goal(s):  Patient will maintain control of glucose as evidenced by A1c  through collaboration with PharmD and provider.   Interventions: 1:1 collaboration with Susy Frizzle, MD regarding development and update of comprehensive plan of care as evidenced by provider attestation and co-signature Inter-disciplinary care team collaboration (see longitudinal plan of care) Comprehensive medication review performed; medication list updated in electronic medical record  Hyperlipidemia: (LDL goal < 100) -Controlled -Patient with history of cirrhosis -Current treatment: None -Medications previously tried: none noted  -Current dietary patterns: patient has quit snacking as much, limits drinks to water and zero sugar powerade -Current exercise habits: very active around the house, mown  a few lawns and has various landscaping tasks -Educated on Cholesterol goals;  Benefits of statin for ASCVD risk reduction; -Recommended continue current management, LDL controlled.  Agree with PCP, due to hx of cirrhosis would not start statin at this time.  Diabetes (A1c goal <7%) -Controlled -Current medications: Jardiance '25mg'$  Appropriate, Effective, Safe, Accessible -Medications previously tried: none noted  -Current home glucose readings fasting glucose: 90-110 post prandial glucose: not checking -Denies hypoglycemic/hyperglycemic symptoms -Current meal patterns:  breakfast: OJ  lunch:  dinner:  snacks: limited snacking now drinks: water, sugar free powerade -Current exercise: see HTN -Educated on A1c and blood sugar goals; Complications of diabetes including kidney damage, retinal damage, and cardiovascular disease; Benefits of weight loss; Prevention and management of hypoglycemic episodes; Benefits of routine self-monitoring of blood sugar; -Counseled to check feet daily and get yearly eye exams -Recommended to continue current medication Assessed patient finances. He has been approved for [employer] assistance program.  Will be due for renewal end of 2023.  Recommend recheck A1c.  He is also due for diabetic kidney panel.   Patient Goals/Self-Care Activities Patient will:  - take medications as prescribed as evidenced by patient report and record review focus on medication adherence by pill counts collaborate  with provider on medication access solutions  Follow Up Plan: The care management team will reach out to the patient again over the next 90 days.       Mr. Baria was given information about Chronic Care Management services today including:  CCM service includes personalized support from designated clinical staff supervised by his physician, including individualized plan of care and coordination with other care providers 24/7 contact phone numbers for  assistance for urgent and routine care needs. Standard insurance, coinsurance, copays and deductibles apply for chronic care management only during months in which we provide at least 20 minutes of these services. Most insurances cover these services at 100%, however patients may be responsible for any copay, coinsurance and/or deductible if applicable. This service may help you avoid the need for more expensive face-to-face services. Only one practitioner may furnish and bill the service in a calendar month. The patient may stop CCM services at any time (effective at the end of the month) by phone call to the office staff.  Patient agreed to services and verbal consent obtained.   The patient verbalized understanding of instructions, educational materials, and care plan provided today and agreed to receive a mailed copy of patient instructions, educational materials, and care plan.  Telephone follow up appointment with pharmacy team member scheduled for: 3 months  Edythe Clarity, [place], PharmD, [place] Clinical Pharmacist Practitioner [place] [phone]

## 2022-07-12 NOTE — Progress Notes (Signed)
Chronic Care Management Pharmacy Note  07/12/2022 Name:  Kenneth Brown MRN:  [id number] DOB:  15-Oct-1951  Summary: Initial visit with Pharmd.  Patient already approved for Jardiance assistance program.  Reports fasting sugars 95-110.  Denies hypoglycemia, has also lost considerable weight while taking it. Has improved dietary choices.  Recommendations/Changes made from today's visit: No changes - renew PAP in December  Plan: FU 3 months to renew   Subjective: Kenneth Brown is an 71 y.o. year old male who is a primary patient of Pickard, Cammie Mcgee, MD.  The CCM team was consulted for assistance with disease management and care coordination needs.    Engaged with patient face to face for initial visit in response to provider referral for pharmacy case management and/or care coordination services.   Consent to Services:  The patient was given the following information about Chronic Care Management services today, agreed to services, and gave verbal consent: 1. CCM service includes personalized support from designated clinical staff supervised by the primary care provider, including individualized plan of care and coordination with other care providers 2. 24/7 contact phone numbers for assistance for urgent and routine care needs. 3. Service will only be billed when office clinical staff spend 20 minutes or more in a month to coordinate care. 4. Only one practitioner may furnish and bill the service in a calendar month. 5.The patient may stop CCM services at any time (effective at the end of the month) by phone call to the office staff. 6. The patient will be responsible for cost sharing (co-pay) of up to 20% of the service fee (after annual deductible is met). Patient agreed to services and consent obtained.  Patient Care Team: Susy Frizzle, MD as PCP - General (Family Medicine) Lucilla Lame, MD as Consulting Physician (Gastroenterology) Lorelee Cover., MD as Consulting  Physician (Ophthalmology) Diona Browner Paulino Door, MD as Referring Physician (Orthopedic Surgery) Edythe Clarity, [facility] as Pharmacist (Pharmacist)  Recent office visits: 11/02/21 - Labs showed A1c elevated at 6.9% recommended he start Jardiance 15m daily.  Recent consult visits: None in the past 6 months  Hospital visits: None in previous 6 months   Objective:  Lab Results  Component Value Date   CREATININE 0.97 11/02/2021   BUN 13 11/02/2021   EGFR 83 11/02/2021   GFRNONAA 73 02/25/2018   GFRAA 85 02/25/2018   NA 136 11/02/2021   K 4.1 11/02/2021   CALCIUM 9.3 11/02/2021   CO2 26 11/02/2021   GLUCOSE 161 (H) 11/02/2021    Lab Results  Component Value Date/Time   HGBA1C 6.9 (H) 11/02/2021 10:01 AM   HGBA1C 6.4 (H) 06/08/2021 08:57 AM   MICROALBUR 4.6 11/02/2021 10:01 AM   MICROALBUR 850m05/18/2022 10:32 AM   MICROALBUR 50 05/07/2016 09:28 AM    Last diabetic Eye exam:  Lab Results  Component Value Date/Time   HMDIABEYEEXA No Retinopathy 06/21/2022 07:51 AM    Last diabetic Foot exam: No results found for: "HMDIABFOOTEX"   Lab Results  Component Value Date   CHOL 141 11/02/2021   HDL 33 (L) 11/02/2021   LDLCALC 69 11/02/2021   TRIG 325 (H) 11/02/2021   CHOLHDL 4.3 11/02/2021       Latest Ref Rng & Units 11/02/2021   10:01 AM 06/08/2021    8:57 AM 03/09/2021    8:37 AM  Hepatic Function  Total Protein 6.1 - 8.1 g/dL 6.8  6.7  6.8   Albumin 3.7 - 4.7 g/dL  4.5  4.3   AST 10 - 35 U/L 51  51  55   ALT 9 - 46 U/L 39  41  42   Alk Phosphatase 44 - 121 IU/L  85  82   Total Bilirubin 0.2 - 1.2 mg/dL 1.2  1.0  1.0     Lab Results  Component Value Date/Time   TSH 4.340 03/09/2021 08:37 AM   TSH 2.940 02/25/2018 10:30 AM       Latest Ref Rng & Units 11/02/2021   10:01 AM 06/08/2021    8:57 AM 03/09/2021    8:37 AM  CBC  WBC 3.8 - 10.8 Thousand/uL 5.2  5.5  5.0   Hemoglobin 13.2 - 17.1 g/dL 15.4  15.5  15.7   Hematocrit 38.5 - 50.0 % 46.0  45.4  46.6    Platelets  CANCELED  97  98     No results found for: "VD25OH"  Clinical ASCVD: No  The 10-year ASCVD risk score (Arnett DK, et al., 2019) is: 41.2%   Values used to calculate the score:     Age: 71 years     Sex: Male     Is Non-Hispanic African American: No     Diabetic: Yes     Tobacco smoker: No     Systolic Blood Pressure: 161 mmHg     Is BP treated: Yes     HDL Cholesterol: 33 mg/dL     Total Cholesterol: 141 mg/dL       03/22/2022    9:54 AM 06/15/2021   10:24 AM 03/15/2021    9:52 AM  Depression screen PHQ 2/9  Decreased Interest 0 0 0  Down, Depressed, Hopeless 0 0 0  PHQ - 2 Score 0 0 0  Altered sleeping  0 1  Tired, decreased energy  0 0  Change in appetite  0 0  Feeling bad or failure about yourself   0 0  Trouble concentrating  0 0  Moving slowly or fidgety/restless  0 0  Suicidal thoughts  0 0  PHQ-9 Score  0 1  Difficult doing work/chores   Not difficult at all     Social History   Tobacco Use  Smoking Status Never  Smokeless Tobacco Never   BP Readings from Last 3 Encounters:  03/22/22 128/76  11/02/21 (!) 142/88  06/15/21 139/71   Pulse Readings from Last 3 Encounters:  03/22/22 70  11/02/21 86  06/15/21 86   Wt Readings from Last 3 Encounters:  03/22/22 211 lb (95.7 kg)  11/02/21 235 lb (106.6 kg)  06/15/21 231 lb 14.4 oz (105.2 kg)   BMI Readings from Last 3 Encounters:  03/22/22 31.16 kg/m  11/02/21 34.70 kg/m  06/15/21 34.25 kg/m    Assessment/Interventions: Review of patient past medical history, allergies, medications, health status, including review of consultants reports, laboratory and other test data, was performed as part of comprehensive evaluation and provision of chronic care management services.   SDOH:  (Social Determinants of Health) assessments and interventions performed: Yes Financial Resource Strain: Low Risk  (07/12/2022)   Overall Financial Resource Strain (CARDIA)    Difficulty of Paying Living Expenses:  Not very hard   Food Insecurity: No Food Insecurity (07/12/2022)   Hunger Vital Sign    Worried About Running Out of Food in the Last Year: Never true    Ran Out of Food in the Last Year: Never true    SDOH Interventions    Flowsheet Row Clinical Support from 03/22/2022  in [place] Interventions   Food Insecurity Interventions Intervention Not Indicated  Housing Interventions Intervention Not Indicated  Transportation Interventions Intervention Not Indicated  Financial Strain Interventions Intervention Not Indicated  Physical Activity Interventions Intervention Not Indicated  Stress Interventions Intervention Not Indicated  Social Connections Interventions Intervention Not Indicated      SDOH Screenings   Food Insecurity: No Food Insecurity (07/12/2022)  Housing: Low Risk  (03/22/2022)  Transportation Needs: No Transportation Needs (03/22/2022)  Alcohol Screen: Low Risk  (03/22/2022)  Depression (PHQ2-9): Low Risk  (03/22/2022)  Financial Resource Strain: Low Risk  (07/12/2022)  Physical Activity: Sufficiently Active (03/22/2022)  Social Connections: Socially Integrated (03/22/2022)  Stress: No Stress Concern Present (03/22/2022)  Tobacco Use: Low Risk  (03/22/2022)    [place]  No Known Allergies  Medications Reviewed Today     Reviewed by Edythe Clarity, [facility] (Pharmacist) on 07/12/22 at 1045  Med List Status: <None>   Medication Order Taking? Sig Documenting Provider Last Dose Status Informant  B Complex-C-Biotin-D-Zinc-FA (VITAL-D RX PO) [id number] Yes Take 50 mg by mouth daily. [provider] Taking Active   blood glucose meter kit and supplies KIT [id number] Yes Dispense based on patient and insurance preference. Use up to four times daily as directed. Susy Frizzle, MD Taking Active   empagliflozin (JARDIANCE) 25 MG TABS tablet [id number] Yes Take 1 tablet (25 mg total) by mouth daily before breakfast. Susy Frizzle, MD Taking  Active   glucose blood [facility] ULTRA) test strip [id number] Yes Use as instructed Susy Frizzle, MD Taking Active   Lancet Devices ([place] LANCING DEV) [place] [id number] Yes 1 each by Does not apply route as directed. Susy Frizzle, MD Taking Active   Lancets (ONETOUCH DELICA PLUS [id number]) [place] [id number] Yes Apply 1 each topically 4 (four) times daily. 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/2017   Primary localized osteoarthrosis of ankle and foot 05/28/2017   B12 deficiency 05/07/2015   Splenomegaly 05/06/2015   Thrombocytopenia ([place]) 04/17/2015   Abnormal liver enzymes 03/02/2015   Allergic rhinitis 03/02/2015   Blood pressure elevated 03/02/2015   Borderline diabetes 03/02/2015   History of colon polyps 03/02/2015   Personal history of traumatic fracture 03/02/2015   H/O disease 03/02/2015   H/O renal calculi 03/02/2015   Personal history of infectious and parasitic disease 03/02/2015   HLD (hyperlipidemia) 03/02/2015   Type 2 diabetes mellitus without complication, without long-term current use of insulin ([place]) 03/02/2015   Adiposity 03/02/2015   Arthritis 10/12/2014    Immunization History  Administered Date(s) Administered   Fluad Quad(high Dose 65+) 08/10/2019   Influenza, High Dose Seasonal PF 10/03/2016, 08/26/2017, 09/03/2018   Influenza,inj,quad, With Preservative 07/29/2016   Moderna  Sars-Covid-2 Vaccination 12/26/2019, 08/25/2020, 03/13/2021   Pneumococcal Conjugate-13 01/24/2016   Pneumococcal Polysaccharide-23 02/19/2017   Tdap 05/20/2013   Zoster, Live  05/20/2013    Conditions to be addressed/monitored:  Hyperlipidemia, Diabetes, and Allergic Rhinitis  Care Plan : General Pharmacy (Adult)  Updates made by Edythe Clarity, RPH since 07/12/2022 12:00 AM     Problem: DM, HLD   Priority: High     Long-Range Goal: Patient-Specific Goal   Start Date: 07/12/2022  Expected End Date: 01/10/2023  This Visit's Progress: On track  Priority: High  Note:   Current Barriers:  Jardiance copay  Pharmacist Clinical Goal(s):  Patient will maintain control of glucose as evidenced by A1c  through collaboration with PharmD and provider.   Interventions: 1:1 collaboration with Susy Frizzle, MD regarding development and update of comprehensive plan of care as evidenced by provider attestation and co-signature Inter-disciplinary care team collaboration (see longitudinal plan of care) Comprehensive medication review performed; medication list updated in electronic medical record  Hyperlipidemia: (LDL goal < 100) -Controlled -Patient with history of cirrhosis -Current treatment: None -Medications previously tried: none noted  -Current dietary patterns: patient has quit snacking as much, limits drinks to water and zero sugar powerade -Current exercise habits: very active around the house, mown a few lawns and has various landscaping tasks -Educated on Cholesterol goals;  Benefits of statin for ASCVD risk reduction; -Recommended continue current management, LDL controlled.  Agree with PCP, due to hx of cirrhosis would not start statin at this time.  Diabetes (A1c goal <7%) -Controlled -Current medications: Jardiance 34m Appropriate, Effective, Safe, Accessible -Medications previously tried: none noted  -Current home glucose readings fasting glucose: 90-110 post  prandial glucose: not checking -Denies hypoglycemic/hyperglycemic symptoms -Current meal patterns:  breakfast: OJ  lunch:  dinner:  snacks: limited snacking now drinks: water, sugar free powerade -Current exercise: see HTN -Educated on A1c and blood sugar goals; Complications of diabetes including kidney damage, retinal damage, and cardiovascular disease; Benefits of weight loss; Prevention and management of hypoglycemic episodes; Benefits of routine self-monitoring of blood sugar; -Counseled to check feet daily and get yearly eye exams -Recommended to continue current medication Assessed patient finances. He has been approved for [employer]istance program.  Will be due for renewal end of 2023.  Recommend recheck A1c.  He is also due for diabetic kidney panel.   Patient Goals/Self-Care Activities Patient will:  - take medications as prescribed as evidenced by patient report and record review focus on medication adherence by pill counts collaborate with provider on medication access solutions  Follow Up Plan: The care management team will reach out to the patient again over the next 90 days.        Medication Assistance:  Jardiance obtained through [employer] assistance program.  Enrollment ends 10/28/22  Compliance/Adherence/Medication fill history: Care Gaps: A1c Kidney panel  Star-Rating Drugs: None  Patient's [facility] pharmacy is:  [facility] [address], [place]Phone: [phone]: [phone] [place]Phone: [phone]: [phone] Uses pill box? No - only taking Jardiance Pt endorses 100% compliance  We discussed: Benefits of medication synchronization, packaging and delivery as well as enhanced pharmacist oversight with [facility]. Patient decided to:  Utilize [facility] pharmacy for medication synchronization, packaging and delivery  Care Plan and Follow Up Patient Decision:  Patient agrees to Care Plan and Follow-up.  Plan: The care management team will reach out to the patient again over the next 90 days.  Beverly Milch, PharmD, CPP Clinical Pharmacist Practitioner [place] [phone]

## 2022-07-24 DIAGNOSIS — H40003 Preglaucoma, unspecified, bilateral: Secondary | ICD-10-CM | POA: Diagnosis not present

## 2022-07-28 DIAGNOSIS — Z7984 Long term (current) use of oral hypoglycemic drugs: Secondary | ICD-10-CM

## 2022-07-28 DIAGNOSIS — E1169 Type 2 diabetes mellitus with other specified complication: Secondary | ICD-10-CM

## 2022-07-28 DIAGNOSIS — E785 Hyperlipidemia, unspecified: Secondary | ICD-10-CM

## 2022-09-07 DIAGNOSIS — H2512 Age-related nuclear cataract, left eye: Secondary | ICD-10-CM | POA: Diagnosis not present

## 2022-09-07 DIAGNOSIS — H25013 Cortical age-related cataract, bilateral: Secondary | ICD-10-CM | POA: Diagnosis not present

## 2022-09-07 DIAGNOSIS — H25043 Posterior subcapsular polar age-related cataract, bilateral: Secondary | ICD-10-CM | POA: Diagnosis not present

## 2022-09-07 DIAGNOSIS — H18413 Arcus senilis, bilateral: Secondary | ICD-10-CM | POA: Diagnosis not present

## 2022-09-07 DIAGNOSIS — H2513 Age-related nuclear cataract, bilateral: Secondary | ICD-10-CM | POA: Diagnosis not present

## 2022-11-05 DIAGNOSIS — H2512 Age-related nuclear cataract, left eye: Secondary | ICD-10-CM | POA: Diagnosis not present

## 2022-11-06 DIAGNOSIS — H2511 Age-related nuclear cataract, right eye: Secondary | ICD-10-CM | POA: Diagnosis not present

## 2022-11-13 ENCOUNTER — Encounter: Payer: Self-pay | Admitting: Family Medicine

## 2022-11-13 ENCOUNTER — Ambulatory Visit (INDEPENDENT_AMBULATORY_CARE_PROVIDER_SITE_OTHER): Payer: PPO | Admitting: Family Medicine

## 2022-11-13 VITALS — BP 124/78 | HR 75 | Ht 69.0 in | Wt 201.0 lb

## 2022-11-13 DIAGNOSIS — K7460 Unspecified cirrhosis of liver: Secondary | ICD-10-CM | POA: Diagnosis not present

## 2022-11-13 DIAGNOSIS — E119 Type 2 diabetes mellitus without complications: Secondary | ICD-10-CM

## 2022-11-13 NOTE — Progress Notes (Signed)
Subjective:    Patient ID: Kenneth Brown, male    DOB: 08/17/1951, 72 y.o.   MRN: [id number]  HPI  Patient is a very pleasant 72 year old Caucasian gentleman who presents today for follow up.  Past medical history is significant for cirrhosis.  This was seen on ultrasound of his liver.  He does have a protuberant abdomen a large diastases recti as well as an umbilical hernia.  There is no jaundice.  He was screened for hepatitis C all found to be negative for hepatitis C in May.  He denies any alcohol consumption.  Therefore I think that his cirrhosis is due to fatty liver.  He also has a history of borderline type 2 diabetes mellitus.   He is currently on Jardiance.  Fasting blood sugars are outstanding.  The vast majority are in the 90s.  He denies any hypoglycemic episodes.  He denies any neuropathy in his feet.  He recently had cataract surgery so his diabetic eye exam is up-to-date.  Diabetic foot exam was performed today and was normal.  He denies any chest pain shortness of breath or dyspnea on exertion.  Past Medical History:  Diagnosis Date   Acute cystitis    Arthritis    ankle   Diabetes mellitus without complication ([place])    pre diabetic   Difficulty urinating    Gross hematuria    Platelets decreased (HCC)    Renal calculus, bilateral    UTI (urinary tract infection) 10/2014   Past Surgical History:  Procedure Laterality Date   ANKLE SURGERY  08/28/12   COLONOSCOPY N/A 06/12/2021   Procedure: COLONOSCOPY with BIOPSY;  Surgeon: Lucilla Lame, MD;  Location: [facility];  Service: Endoscopy;  Laterality: N/A;   COLONOSCOPY WITH PROPOFOL N/A 07/08/2015   Procedure: COLONOSCOPY WITH PROPOFOL;  Surgeon: Lucilla Lame, MD;  Location: [facility];  Service: Endoscopy;  Laterality: N/A;   ESOPHAGOGASTRODUODENOSCOPY (EGD) WITH PROPOFOL N/A 07/08/2015   Procedure: ESOPHAGOGASTRODUODENOSCOPY (EGD) WITH PROPOFOL;  Surgeon: Lucilla Lame, MD;  Location: [facility];  Service: Endoscopy;  Laterality: N/A;   FRACTURE SURGERY N/A    Phreesia 01/30/2021   JOINT REPLACEMENT N/A    Phreesia 01/30/2021   ORIF ANKLE FRACTURE Right    plate and screws, pt still has   POLYPECTOMY  07/08/2015   Procedure: POLYPECTOMY;  Surgeon: Lucilla Lame, MD;  Location: [facility];  Service: Endoscopy;;   POLYPECTOMY N/A 06/12/2021   Procedure: POLYPECTOMY;  Surgeon: Lucilla Lame, MD;  Location: [facility];  Service: Endoscopy;  Laterality: N/A;   URETEROSCOPY WITH HOLMIUM LASER LITHOTRIPSY Right 03/21/2015   Procedure: URETEROSCOPY WITH HOLMIUM LASER LITHOTRIPSY,retrograde pyelogram,stent placement;  Surgeon: Collier Flowers, MD;  Location: [facility];  Service: Urology;  Laterality: Right;   Current Outpatient Medications on File Prior to Visit  Medication Sig Dispense Refill   B Complex-C-Biotin-D-Zinc-FA (VITAL-D RX PO) Take 50 mg by mouth daily.     blood glucose meter kit and supplies KIT Dispense based on patient and insurance preference. Use up to four times daily as directed. 1 each 0   empagliflozin (JARDIANCE) 25 MG TABS tablet Take 1 tablet (25 mg total) by mouth daily before breakfast. 90 tablet 3   glucose blood (ONETOUCH ULTRA) test strip Use as instructed 100 each 12   Lancet Devices (ONE TOUCH DELICA LANCING DEV) MISC 1 each by Does not apply route as directed. 100 each 12   Lancets (ONETOUCH DELICA PLUS [id number]) [place] Apply  1 each topically 4 (four) times daily.     No current facility-administered medications on file prior to visit.   [place] Kitchenall Social History   Socioeconomic History   Marital status: Married    Spouse name: Not on file   Number of children: 1   Years of education: Not on file   Highest education level: Some college, no degree  Occupational History   Occupation: part time Architect  Tobacco Use   Smoking status: Never   Smokeless tobacco: Never  Vaping Use   Vaping Use: Never used  Substance and Sexual Activity    Alcohol use: Yes    Alcohol/week: 0.0 - 1.0 standard drinks of alcohol    Comment: 1-2 drinks a month   Drug use: No   Sexual activity: Yes  Other Topics Concern   Not on file  Social History Narrative   Not on file   Social Determinants of Health   Financial Resource Strain: Low Risk  (07/12/2022)   Overall Financial Resource Strain (CARDIA)    Difficulty of Paying Living Expenses: Not very hard  Food Insecurity: No Food Insecurity (07/12/2022)   Hunger Vital Sign    Worried About Running Out of Food in the Last Year: Never true    Ran Out of Food in the Last Year: Never true  Transportation Needs: No Transportation Needs (03/22/2022)   PRAPARE - Hydrologist (Medical): No    Lack of Transportation (Non-Medical): No  Physical Activity: Sufficiently Active (03/22/2022)   Exercise Vital Sign    Days of Exercise per Week: 5 days    Minutes of Exercise per Session: 30 min  Stress: No Stress Concern Present (03/22/2022)   [place]    Feeling of Stress : Not at all  Social Connections: [place] (03/22/2022)   Social Connection and Isolation Panel [NHANES]    Frequency of Communication with Friends and Family: More than three times a week    Frequency of Social Gatherings with Friends and Family: More than three times a week    Attends Religious Services: More than 4 times per year    Active Member of [employer] or Organizations: Yes    Attends Music therapist: More than 4 times per year    Marital Status: Married  Human resources officer Violence: Not At Risk (03/22/2022)   Humiliation, Afraid, Rape, and Kick questionnaire    Fear of Current or Ex-Partner: No    Emotionally Abused: No    Physically Abused: No    Sexually Abused: No   Family History  Problem Relation Age of Onset   Hypertension Mother    Heart disease Mother    Stroke Mother    COPD Mother    Heart  disease Father    Heart attack Father    Lung cancer Father    Skin cancer Father      Review of Systems  All other systems reviewed and are negative.      Objective:   Physical Exam Vitals reviewed.  Constitutional:      General: He is not in acute distress.    Appearance: Normal appearance. He is obese. He is not ill-appearing, toxic-appearing or diaphoretic.  HENT:     Head: Normocephalic and atraumatic.     Right Ear: Tympanic membrane and ear canal normal.     Left Ear: Tympanic membrane and ear canal normal.     Mouth/Throat:  Mouth: Mucous membranes are moist.     Pharynx: Oropharynx is clear. No oropharyngeal exudate or posterior oropharyngeal erythema.  Eyes:     Extraocular Movements: Extraocular movements intact.     Conjunctiva/sclera: Conjunctivae normal.     Pupils: Pupils are equal, round, and reactive to light.  Neck:     Vascular: No carotid bruit.  Cardiovascular:     Rate and Rhythm: Normal rate and regular rhythm.     Pulses: Normal pulses.     Heart sounds: Normal heart sounds. No murmur heard.    No friction rub. No gallop.  Pulmonary:     Effort: Pulmonary effort is normal. No respiratory distress.     Breath sounds: Normal breath sounds. No wheezing, rhonchi or rales.  Abdominal:     General: There is distension.     Palpations: Abdomen is soft.     Tenderness: There is no abdominal tenderness. There is no guarding.     Hernia: A hernia is present.  Musculoskeletal:        General: No swelling.     Cervical back: Normal range of motion and neck supple. No tenderness.     Right lower leg: No edema.     Left lower leg: No edema.  Lymphadenopathy:     Cervical: No cervical adenopathy.  Skin:    Coloration: Skin is not jaundiced or pale.     Findings: No bruising, erythema, lesion or rash.  Neurological:     General: No focal deficit present.     Mental Status: He is alert and oriented to person, place, and time. Mental status is at  baseline.     Cranial Nerves: No cranial nerve deficit.     Sensory: No sensory deficit.     Motor: No weakness.     Coordination: Coordination normal.     Gait: Gait normal.  Psychiatric:        Mood and Affect: Mood normal.        Behavior: Behavior normal.        Thought Content: Thought content normal.        Judgment: Judgment normal.           Assessment & Plan:  Cirrhosis of liver without ascites, unspecified hepatic cirrhosis type ([place]) - Plan: CBC with Differential/Platelet, Lipid panel, COMPLETE METABOLIC PANEL WITH GFR, Hemoglobin A1c, Protein / Creatinine Ratio, Urine  Type 2 diabetes mellitus without complication, without long-term current use of insulin (HCC) - Plan: CBC with Differential/Platelet, Lipid panel, COMPLETE METABOLIC PANEL WITH GFR, Hemoglobin A1c, Protein / Creatinine Ratio, Urine Patient has cirrhosis of the liver presumed due to fatty liver disease.  I will obtain a ultrasound to evaluate for any mass.  Strongly recommended a low-fat diet with aerobic exercise.  If A1c is outstanding, could consider switching Jardiance to Ozempic to try to facilitate weight loss to help address fatty liver disease.  Immunizations up-to-date.  Did admit most recent COVID booster.  Check a urine protein annual shows a fasting lipid panel.  Goal A1c is less than 6.5, goal LDL is less than 100.

## 2022-11-14 LAB — CBC WITH DIFFERENTIAL/PLATELET
Absolute Monocytes: 293 cells/uL (ref 200–950)
Basophils Absolute: 19 cells/uL (ref 0–200)
Basophils Relative: 0.4 %
Eosinophils Absolute: 139 cells/uL (ref 15–500)
Eosinophils Relative: 2.9 %
HCT: 49.8 % (ref 38.5–50.0)
Hemoglobin: 16.7 g/dL (ref 13.2–17.1)
Lymphs Abs: 869 cells/uL (ref 850–3900)
MCH: 30 pg (ref 27.0–33.0)
MCHC: 33.5 g/dL (ref 32.0–36.0)
MCV: 89.6 fL (ref 80.0–100.0)
MPV: 10.1 fL (ref 7.5–12.5)
Monocytes Relative: 6.1 %
Neutro Abs: 3480 cells/uL (ref 1500–7800)
Neutrophils Relative %: 72.5 %
Platelets: 64 10*3/uL — ABNORMAL LOW (ref 140–400)
RBC: 5.56 10*6/uL (ref 4.20–5.80)
RDW: 14.5 % (ref 11.0–15.0)
Total Lymphocyte: 18.1 %
WBC: 4.8 10*3/uL (ref 3.8–10.8)

## 2022-11-14 LAB — COMPLETE METABOLIC PANEL WITH GFR
AG Ratio: 1.8 (calc) (ref 1.0–2.5)
ALT: 34 U/L (ref 9–46)
AST: 41 U/L — ABNORMAL HIGH (ref 10–35)
Albumin: 4.6 g/dL (ref 3.6–5.1)
Alkaline phosphatase (APISO): 85 U/L (ref 35–144)
BUN: 14 mg/dL (ref 7–25)
CO2: 26 mmol/L (ref 20–32)
Calcium: 9.5 mg/dL (ref 8.6–10.3)
Chloride: 103 mmol/L (ref 98–110)
Creat: 1 mg/dL (ref 0.70–1.28)
Globulin: 2.5 g/dL (calc) (ref 1.9–3.7)
Glucose, Bld: 93 mg/dL (ref 65–99)
Potassium: 3.6 mmol/L (ref 3.5–5.3)
Sodium: 139 mmol/L (ref 135–146)
Total Bilirubin: 1.2 mg/dL (ref 0.2–1.2)
Total Protein: 7.1 g/dL (ref 6.1–8.1)
eGFR: 80 mL/min/{1.73_m2} (ref >=60)

## 2022-11-14 LAB — HEMOGLOBIN A1C
Hgb A1c MFr Bld: 5.8 % of total Hgb — ABNORMAL HIGH (ref <5.7)
Mean Plasma Glucose: 120 mg/dL
eAG (mmol/L): 6.6 mmol/L

## 2022-11-14 LAB — LIPID PANEL
Cholesterol: 174 mg/dL (ref <200)
HDL: 47 mg/dL (ref >=40)
LDL Cholesterol (Calc): 101 mg/dL (calc) — ABNORMAL HIGH
Non-HDL Cholesterol (Calc): 127 mg/dL (calc) (ref <130)
Total CHOL/HDL Ratio: 3.7 (calc) (ref <5.0)
Triglycerides: 160 mg/dL — ABNORMAL HIGH (ref <150)

## 2022-11-14 LAB — PROTEIN / CREATININE RATIO, URINE
Creatinine, Urine: 70 mg/dL (ref 20–320)
Protein/Creat Ratio: 114 mg/g creat (ref 25–148)
Protein/Creatinine Ratio: 0.114 mg/mg creat (ref 0.025–0.148)
Total Protein, Urine: 8 mg/dL (ref 5–25)

## 2022-11-20 ENCOUNTER — Telehealth: Payer: Self-pay | Admitting: Gastroenterology

## 2022-11-20 NOTE — Telephone Encounter (Signed)
Pt medical records from 2016 to current were picked up by pt  on 11/20/2022

## 2022-11-22 ENCOUNTER — Telehealth: Payer: Self-pay

## 2022-11-22 NOTE — Telephone Encounter (Signed)
Note from labs on 11/15/22: Diabetes test is outstanding, labs show low platelets likely due to cirrhosis. Await [place] of liver, avoid aspirin.  Pt's wife Jenny Reichmann called and states pt does not want to do the liver u/s at this time due to financial reasons. Jenny Reichmann brought by copies of previous scans and labs for you to review. Thank you.

## 2022-11-22 NOTE — Telephone Encounter (Signed)
Note from labs on 11/15/22: Diabetes test is outstanding, labs show low platelets likely due to cirrhosis. Await [place] of liver, avoid aspirin.   Pt's wife Kenneth Brown called and states pt does not want to do the liver u/s at this time due to financial reasons. Kenneth Brown brought by copies of previous scans and labs for you to review. Thank you.       Copies of scans in green folder. Thank you.

## 2022-11-23 ENCOUNTER — Encounter: Payer: Self-pay | Admitting: Family Medicine

## 2022-11-23 DIAGNOSIS — K7460 Unspecified cirrhosis of liver: Secondary | ICD-10-CM | POA: Insufficient documentation

## 2022-12-03 DIAGNOSIS — H2511 Age-related nuclear cataract, right eye: Secondary | ICD-10-CM | POA: Diagnosis not present

## 2022-12-04 ENCOUNTER — Ambulatory Visit (INDEPENDENT_AMBULATORY_CARE_PROVIDER_SITE_OTHER): Payer: PPO | Admitting: Family Medicine

## 2022-12-04 ENCOUNTER — Ambulatory Visit (HOSPITAL_COMMUNITY)
Admission: RE | Admit: 2022-12-04 | Discharge: 2022-12-04 | Disposition: A | Discharge status: Home / Self Care | Payer: PPO | Source: Ambulatory Visit | Attending: Family Medicine | Admitting: Family Medicine

## 2022-12-04 ENCOUNTER — Other Ambulatory Visit: Payer: Self-pay | Admitting: Family Medicine

## 2022-12-04 ENCOUNTER — Encounter: Payer: Self-pay | Admitting: Family Medicine

## 2022-12-04 VITALS — BP 126/72 | HR 77 | Temp 98.3°F | Ht 69.0 in | Wt 206.8 lb

## 2022-12-04 DIAGNOSIS — R091 Pleurisy: Secondary | ICD-10-CM

## 2022-12-04 DIAGNOSIS — J90 Pleural effusion, not elsewhere classified: Secondary | ICD-10-CM

## 2022-12-04 DIAGNOSIS — R911 Solitary pulmonary nodule: Secondary | ICD-10-CM

## 2022-12-04 MED ORDER — HYDROCODONE-ACETAMINOPHEN 5-325 MG PO TABS: 1.0000 | ORAL_TABLET | Freq: Four times a day (QID) | ORAL | 0 refills | Status: DC | PRN

## 2022-12-04 NOTE — Progress Notes (Signed)
Subjective:    Patient ID: Kenneth Brown, male    DOB: 06-13-1951, 72 y.o.   MRN: [id number]  Abdominal Pain  Tender to left flank and side for the 2-day ago.  It hurts to take a deep breath in.  He has been having some chills but he denies any cough.  He denies any shortness of breath although he does report that the pain hurts so bad when he takes a deep breath it will "take his breath away".  He denies any hemoptysis.  He denies any melena or hematochezia.  He denies any nausea or vomiting.  He denies any substernal chest pain.  Palpation of the ribs does not elicit any pain.  There is no visible rash.  However he does have left-sided crackles which are asymmetric in the area where he is experiencing the pleurisy Past Medical History:  Diagnosis Date   Acute cystitis    Arthritis    ankle   Cirrhosis of liver ([place])    Diabetes mellitus without complication ([place])    pre diabetic   Difficulty urinating    Gross hematuria    Platelets decreased ([facility])    Renal calculus, bilateral    UTI (urinary tract infection) 10/2014   Past Surgical History:  Procedure Laterality Date   ANKLE SURGERY  08/28/12   COLONOSCOPY N/A 06/12/2021   Procedure: COLONOSCOPY with BIOPSY;  Surgeon: Lucilla Lame, MD;  Location: [place];  Service: Endoscopy;  Laterality: N/A;   COLONOSCOPY WITH PROPOFOL N/A 07/08/2015   Procedure: COLONOSCOPY WITH PROPOFOL;  Surgeon: Lucilla Lame, MD;  Location: [place];  Service: Endoscopy;  Laterality: N/A;   ESOPHAGOGASTRODUODENOSCOPY (EGD) WITH PROPOFOL N/A 07/08/2015   Procedure: ESOPHAGOGASTRODUODENOSCOPY (EGD) WITH PROPOFOL;  Surgeon: Lucilla Lame, MD;  Location: [place];  Service: Endoscopy;  Laterality: N/A;   FRACTURE SURGERY N/A    Phreesia 01/30/2021   JOINT REPLACEMENT N/A    Phreesia 01/30/2021   ORIF ANKLE FRACTURE Right    plate and screws, pt still has   POLYPECTOMY  07/08/2015   Procedure: POLYPECTOMY;  Surgeon: Lucilla Lame, MD;  Location: [place];  Service: Endoscopy;;   POLYPECTOMY N/A 06/12/2021   Procedure: POLYPECTOMY;  Surgeon: Lucilla Lame, MD;  Location: [place];  Service: Endoscopy;  Laterality: N/A;   URETEROSCOPY WITH HOLMIUM LASER LITHOTRIPSY Right 03/21/2015   Procedure: URETEROSCOPY WITH HOLMIUM LASER LITHOTRIPSY,retrograde pyelogram,stent placement;  Surgeon: Collier Flowers, MD;  Location: [facility];  Service: Urology;  Laterality: Right;   Current Outpatient Medications on File Prior to Visit  Medication Sig Dispense Refill   B Complex-C-Biotin-D-Zinc-FA (VITAL-D RX PO) Take 50 mg by mouth daily.     blood glucose meter kit and supplies KIT Dispense based on patient and insurance preference. Use up to four times daily as directed. 1 each 0   empagliflozin (JARDIANCE) 25 MG TABS tablet Take 1 tablet (25 mg total) by mouth daily before breakfast. 90 tablet 3   glucose blood (ONETOUCH ULTRA) test strip Use as instructed 100 each 12   Lancet Devices (ONE TOUCH DELICA LANCING DEV) MISC 1 each by Does not apply route as directed. 100 each 12   Lancets (ONETOUCH DELICA PLUS [id number]) MISC Apply 1 each topically 4 (four) times daily.     No current facility-administered medications on file prior to visit.   Marland Kitchenall Social History   Socioeconomic History   Marital status: Married    Spouse name: Not on file  Number of children: 1   Years of education: Not on file   Highest education level: Some college, no degree  Occupational History   Occupation: part time Architect  Tobacco Use   Smoking status: Never   Smokeless tobacco: Never  Vaping Use   Vaping Use: Never used  Substance and Sexual Activity   Alcohol use: Yes    Alcohol/week: 0.0 - 1.0 standard drinks of alcohol    Comment: 1-2 drinks a month   Drug use: No   Sexual activity: Yes  Other Topics Concern   Not on file  Social History Narrative   Not on file   Social Determinants of Health   Financial  Resource Strain: Low Risk  (07/12/2022)   Overall Financial Resource Strain (CARDIA)    Difficulty of Paying Living Expenses: Not very hard  Food Insecurity: No Food Insecurity (07/12/2022)   Hunger Vital Sign    Worried About Running Out of Food in the Last Year: Never true    Ran Out of Food in the Last Year: Never true  Transportation Needs: No Transportation Needs (03/22/2022)   PRAPARE - Hydrologist (Medical): No    Lack of Transportation (Non-Medical): No  Physical Activity: Sufficiently Active (03/22/2022)   Exercise Vital Sign    Days of Exercise per Week: 5 days    Minutes of Exercise per Session: 30 min  Stress: No Stress Concern Present (03/22/2022)   [place]    Feeling of Stress : Not at all  Social Connections: [place] (03/22/2022)   Social Connection and Isolation Panel [NHANES]    Frequency of Communication with Friends and Family: More than three times a week    Frequency of Social Gatherings with Friends and Family: More than three times a week    Attends Religious Services: More than 4 times per year    Active Member of [employer] or Organizations: Yes    Attends Music therapist: More than 4 times per year    Marital Status: Married  Human resources officer Violence: Not At Risk (03/22/2022)   Humiliation, Afraid, Rape, and Kick questionnaire    Fear of Current or Ex-Partner: No    Emotionally Abused: No    Physically Abused: No    Sexually Abused: No   Family History  Problem Relation Age of Onset   Hypertension Mother    Heart disease Mother    Stroke Mother    COPD Mother    Heart disease Father    Heart attack Father    Lung cancer Father    Skin cancer Father      Review of Systems  Gastrointestinal:  Positive for abdominal pain.  All other systems reviewed and are negative.      Objective:   Physical Exam Vitals reviewed.   Constitutional:      General: He is not in acute distress.    Appearance: Normal appearance. He is obese. He is not ill-appearing, toxic-appearing or diaphoretic.  HENT:     Head: Normocephalic and atraumatic.     Right Ear: Tympanic membrane and ear canal normal.     Left Ear: Tympanic membrane and ear canal normal.     Mouth/Throat:     Mouth: Mucous membranes are moist.     Pharynx: Oropharynx is clear. No oropharyngeal exudate or posterior oropharyngeal erythema.  Eyes:     Extraocular Movements: Extraocular movements intact.  Conjunctiva/sclera: Conjunctivae normal.     Pupils: Pupils are equal, round, and reactive to light.  Neck:     Vascular: No carotid bruit.  Cardiovascular:     Rate and Rhythm: Normal rate and regular rhythm.     Pulses: Normal pulses.     Heart sounds: Normal heart sounds. No murmur heard.    No friction rub. No gallop.  Pulmonary:     Effort: Pulmonary effort is normal. No respiratory distress.     Breath sounds: Examination of the left-middle field reveals rales. Examination of the left-lower field reveals rales. Rales present. No wheezing or rhonchi.    Chest:    Abdominal:     General: There is distension.     Palpations: Abdomen is soft.     Tenderness: There is no abdominal tenderness. There is no guarding.     Hernia: A hernia is present.  Musculoskeletal:        General: No swelling.     Cervical back: Normal range of motion and neck supple. No tenderness.     Right lower leg: No edema.     Left lower leg: No edema.  Lymphadenopathy:     Cervical: No cervical adenopathy.  Skin:    Coloration: Skin is not jaundiced or pale.     Findings: No bruising, erythema, lesion or rash.  Neurological:     General: No focal deficit present.     Mental Status: He is alert and oriented to person, place, and time. Mental status is at baseline.     Cranial Nerves: No cranial nerve deficit.     Sensory: No sensory deficit.     Motor: No  weakness.     Coordination: Coordination normal.     Gait: Gait normal.  Psychiatric:        Mood and Affect: Mood normal.        Behavior: Behavior normal.        Thought Content: Thought content normal.        Judgment: Judgment normal.           Assessment & Plan:  Pleurisy - Plan: DG Chest 2 View, CBC with Differential/Platelet, D-dimer, quantitative Very unusual presentation.  Begin by obtaining a chest x-ray.  Consider the patient has good exam symptoms do not suggest that.  If the chest x-ray is normal, I would also be concerned about possible pulmonary embolism given the severity of his pleurisy.  I will check a D-dimer along with a CBC.  Check a CMP.  If elevated liver function test, consider evaluating for portal vein thrombosis with a possible pleural effusion.  Does not sound musculoskeletal.  His ribs are nontender to palpation.  Does not sound renal.  He denies any hematuria or dysuria

## 2022-12-05 ENCOUNTER — Ambulatory Visit
Admission: RE | Admit: 2022-12-05 | Discharge: 2022-12-05 | Disposition: A | Discharge status: Home / Self Care | Payer: PPO | Source: Ambulatory Visit | Attending: Family Medicine | Admitting: Family Medicine

## 2022-12-05 DIAGNOSIS — R161 Splenomegaly, not elsewhere classified: Secondary | ICD-10-CM | POA: Diagnosis not present

## 2022-12-05 DIAGNOSIS — J9811 Atelectasis: Secondary | ICD-10-CM | POA: Diagnosis not present

## 2022-12-05 DIAGNOSIS — J90 Pleural effusion, not elsewhere classified: Secondary | ICD-10-CM | POA: Diagnosis not present

## 2022-12-05 DIAGNOSIS — R911 Solitary pulmonary nodule: Secondary | ICD-10-CM

## 2022-12-05 DIAGNOSIS — I700 Atherosclerosis of aorta: Secondary | ICD-10-CM | POA: Diagnosis not present

## 2022-12-05 LAB — CBC WITH DIFFERENTIAL/PLATELET
Absolute Monocytes: 675 cells/uL (ref 200–950)
Basophils Absolute: 27 cells/uL (ref 0–200)
Basophils Relative: 0.3 %
Eosinophils Absolute: 171 cells/uL (ref 15–500)
Eosinophils Relative: 1.9 %
HCT: 46.8 % (ref 38.5–50.0)
Hemoglobin: 16.2 g/dL (ref 13.2–17.1)
Lymphs Abs: 972 cells/uL (ref 850–3900)
MCH: 31 pg (ref 27.0–33.0)
MCHC: 34.6 g/dL (ref 32.0–36.0)
MCV: 89.5 fL (ref 80.0–100.0)
MPV: 10.2 fL (ref 7.5–12.5)
Monocytes Relative: 7.5 %
Neutro Abs: 7155 cells/uL (ref 1500–7800)
Neutrophils Relative %: 79.5 %
Platelets: 87 10*3/uL — ABNORMAL LOW (ref 140–400)
RBC: 5.23 10*6/uL (ref 4.20–5.80)
RDW: 14.5 % (ref 11.0–15.0)
Total Lymphocyte: 10.8 %
WBC: 9 10*3/uL (ref 3.8–10.8)

## 2022-12-05 LAB — D-DIMER, QUANTITATIVE: D-Dimer, Quant: 0.94 mcg/mL FEU — ABNORMAL HIGH (ref <0.50)

## 2022-12-05 MED ORDER — IOPAMIDOL (ISOVUE-300) INJECTION 61%
75.0000 mL | Freq: Once | INTRAVENOUS | Status: AC | PRN
  Administered 2022-12-05: 75 mL via INTRAVENOUS

## 2022-12-06 ENCOUNTER — Other Ambulatory Visit: Payer: Self-pay | Admitting: Family Medicine

## 2022-12-06 MED ORDER — AMOXICILLIN-POT CLAVULANATE 875-125 MG PO TABS: 1.0000 | ORAL_TABLET | Freq: Two times a day (BID) | ORAL | 0 refills | Status: DC

## 2022-12-06 MED ORDER — AZITHROMYCIN 250 MG PO TABS: ORAL_TABLET | ORAL | 0 refills | Status: DC

## 2022-12-11 ENCOUNTER — Ambulatory Visit: Payer: PPO | Admitting: Family Medicine

## 2022-12-11 ENCOUNTER — Encounter: Payer: Self-pay | Admitting: Family Medicine

## 2022-12-11 ENCOUNTER — Ambulatory Visit (INDEPENDENT_AMBULATORY_CARE_PROVIDER_SITE_OTHER): Payer: PPO | Admitting: Family Medicine

## 2022-12-11 ENCOUNTER — Ambulatory Visit (HOSPITAL_COMMUNITY)
Admission: RE | Admit: 2022-12-11 | Discharge: 2022-12-11 | Disposition: A | Discharge status: Home / Self Care | Payer: PPO | Source: Ambulatory Visit | Attending: Family Medicine | Admitting: Family Medicine

## 2022-12-11 VITALS — BP 136/82 | HR 82 | Temp 98.4°F | Ht 69.0 in | Wt 206.0 lb

## 2022-12-11 DIAGNOSIS — J90 Pleural effusion, not elsewhere classified: Secondary | ICD-10-CM | POA: Insufficient documentation

## 2022-12-11 DIAGNOSIS — R0781 Pleurodynia: Secondary | ICD-10-CM

## 2022-12-11 DIAGNOSIS — J9811 Atelectasis: Secondary | ICD-10-CM | POA: Diagnosis not present

## 2022-12-11 NOTE — Progress Notes (Signed)
Subjective:    Patient ID: Kenneth Brown, male    DOB: 1951/10/03, 72 y.o.   MRN: [id number] 12/04/22 Tender to left flank and side for the 2-day ago.  It hurts to take a deep breath in.  He has been having some chills but he denies any cough.  He denies any shortness of breath although he does report that the pain hurts so bad when he takes a deep breath it will "take his breath away".  He denies any hemoptysis.  He denies any melena or hematochezia.  He denies any nausea or vomiting.  He denies any substernal chest pain.  Palpation of the ribs does not elicit any pain.  There is no visible rash.  However he does have left-sided crackles which are asymmetric in the area where he is experiencing the pleurisy.  At that  time, my plan was: Very unusual presentation.  Begin by obtaining a chest x-ray.  Consider the patient has good exam symptoms do not suggest that.  If the chest x-ray is normal, I would also be concerned about possible pulmonary embolism given the severity of his pleurisy.  I will check a D-dimer along with a CBC.  Check a CMP.  If elevated liver function test, consider evaluating for portal vein thrombosis with a possible pleural effusion.  Does not sound musculoskeletal.  His ribs are nontender to palpation.  Does not sound renal.  He denies any hematuria or dysuria  12/11/22 IMPRESSION: 1. Dense left lower lobe atelectasis. Underlying pneumonia is not excluded. Associated small left pleural effusion including fluid extending into the major fissure as well as additional posterior lingular atelectasis creates the appearance of a pseudo lesion and the rounded area of by chest x-ray. No discrete pulmonary mass is identified. 2. Extensive calcified coronary artery plaque. 3. Mildly dilated central pulmonary arteries may be consistent with pulmonary hypertension. 4. Hepatic steatosis and probable early cirrhosis of the liver. Associated splenomegaly. 5. Probable small renal  calculi in the upper poles of both kidneys. 6. Aortic atherosclerosis. Chest x-ray showed a left-sided pleural effusion with a possible mass.  Therefore we got the CT scan which is dictated above.  I suspect the patient is dealing with left-sided pneumonia so I started him on Augmentin and Z-Pak.  He presents today stating that he feels much better.  He states that his pain and pleurisy is more than 50% better after only having the antibiotics now for 4 days.  He denies any shortness of breath.  He still has some mild chest pain with inspiration however his lungs are much clearer today on exam.  Diminished breath sounds have stated that he now does have some faint left-sided crackles but this is actually an improvement given the fact previously I was unable to hear any decent breath sounds on the lower left side Past Medical History:  Diagnosis Date   Acute cystitis    Arthritis    ankle   Cirrhosis of liver ([place])    Diabetes mellitus without complication ([place])    pre diabetic   Difficulty urinating    Gross hematuria    Platelets decreased ([place])    Renal calculus, bilateral    UTI (urinary tract infection) 10/2014   Past Surgical History:  Procedure Laterality Date   ANKLE SURGERY  08/28/12   COLONOSCOPY N/A 06/12/2021   Procedure: COLONOSCOPY with BIOPSY;  Surgeon: Lucilla Lame, MD;  Location: [place];  Service: Endoscopy;  Laterality: N/A;   COLONOSCOPY WITH  PROPOFOL N/A 07/08/2015   Procedure: COLONOSCOPY WITH PROPOFOL;  Surgeon: Lucilla Lame, MD;  Location: [place];  Service: Endoscopy;  Laterality: N/A;   ESOPHAGOGASTRODUODENOSCOPY (EGD) WITH PROPOFOL N/A 07/08/2015   Procedure: ESOPHAGOGASTRODUODENOSCOPY (EGD) WITH PROPOFOL;  Surgeon: Lucilla Lame, MD;  Location: [place];  Service: Endoscopy;  Laterality: N/A;   FRACTURE SURGERY N/A    Phreesia 01/30/2021   JOINT REPLACEMENT N/A    Phreesia 01/30/2021   ORIF ANKLE FRACTURE Right    plate and screws,  pt still has   POLYPECTOMY  07/08/2015   Procedure: POLYPECTOMY;  Surgeon: Lucilla Lame, MD;  Location: [place];  Service: Endoscopy;;   POLYPECTOMY N/A 06/12/2021   Procedure: POLYPECTOMY;  Surgeon: Lucilla Lame, MD;  Location: [place];  Service: Endoscopy;  Laterality: N/A;   URETEROSCOPY WITH HOLMIUM LASER LITHOTRIPSY Right 03/21/2015   Procedure: URETEROSCOPY WITH HOLMIUM LASER LITHOTRIPSY,retrograde pyelogram,stent placement;  Surgeon: Collier Flowers, MD;  Location: [facility];  Service: Urology;  Laterality: Right;   Current Outpatient Medications on File Prior to Visit  Medication Sig Dispense Refill   amoxicillin-clavulanate (AUGMENTIN) 875-125 MG tablet Take 1 tablet by mouth 2 (two) times daily. 20 tablet 0   B Complex-C-Biotin-D-Zinc-FA (VITAL-D RX PO) Take 50 mg by mouth daily.     blood glucose meter kit and supplies KIT Dispense based on patient and insurance preference. Use up to four times daily as directed. 1 each 0   empagliflozin (JARDIANCE) 25 MG TABS tablet Take 1 tablet (25 mg total) by mouth daily before breakfast. 90 tablet 3   glucose blood (ONETOUCH ULTRA) test strip Use as instructed 100 each 12   HYDROcodone-acetaminophen (NORCO) 5-325 MG tablet Take 1 tablet by mouth every 6 (six) hours as needed for moderate pain. 30 tablet 0   Lancet Devices (ONE TOUCH DELICA LANCING DEV) MISC 1 each by Does not apply route as directed. 100 each 12   Lancets (ONETOUCH DELICA PLUS [id number]) MISC Apply 1 each topically 4 (four) times daily.     No current facility-administered medications on file prior to visit.   Marland Kitchenall Social History   Socioeconomic History   Marital status: Married    Spouse name: Not on file   Number of children: 1   Years of education: Not on file   Highest education level: Some college, no degree  Occupational History   Occupation: part time Architect  Tobacco Use   Smoking status: Never   Smokeless tobacco: Never  Vaping Use    Vaping Use: Never used  Substance and Sexual Activity   Alcohol use: Yes    Alcohol/week: 0.0 - 1.0 standard drinks of alcohol    Comment: 1-2 drinks a month   Drug use: No   Sexual activity: Yes  Other Topics Concern   Not on file  Social History Narrative   Not on file   Social Determinants of Health   Financial Resource Strain: Low Risk  (07/12/2022)   Overall Financial Resource Strain (CARDIA)    Difficulty of Paying Living Expenses: Not very hard  Food Insecurity: No Food Insecurity (07/12/2022)   Hunger Vital Sign    Worried About Running Out of Food in the Last Year: Never true    Ran Out of Food in the Last Year: Never true  Transportation Needs: No Transportation Needs (03/22/2022)   PRAPARE - Hydrologist (Medical): No    Lack of Transportation (Non-Medical): No  Physical Activity: Sufficiently  Active (03/22/2022)   Exercise Vital Sign    Days of Exercise per Week: 5 days    Minutes of Exercise per Session: 30 min  Stress: No Stress Concern Present (03/22/2022)   [place]    Feeling of Stress : Not at all  Social Connections: [place] (03/22/2022)   Social Connection and Isolation Panel [NHANES]    Frequency of Communication with Friends and Family: More than three times a week    Frequency of Social Gatherings with Friends and Family: More than three times a week    Attends Religious Services: More than 4 times per year    Active Member of [employer] or Organizations: Yes    Attends Music therapist: More than 4 times per year    Marital Status: Married  Human resources officer Violence: Not At Risk (03/22/2022)   Humiliation, Afraid, Rape, and Kick questionnaire    Fear of Current or Ex-Partner: No    Emotionally Abused: No    Physically Abused: No    Sexually Abused: No   Family History  Problem Relation Age of Onset   Hypertension Mother    Heart disease  Mother    Stroke Mother    COPD Mother    Heart disease Father    Heart attack Father    Lung cancer Father    Skin cancer Father      Review of Systems  Gastrointestinal:  Positive for abdominal pain.  All other systems reviewed and are negative.      Objective:   Physical Exam Vitals reviewed.  Constitutional:      General: He is not in acute distress.    Appearance: Normal appearance. He is obese. He is not ill-appearing, toxic-appearing or diaphoretic.  HENT:     Head: Normocephalic and atraumatic.     Right Ear: Tympanic membrane and ear canal normal.     Left Ear: Tympanic membrane and ear canal normal.     Mouth/Throat:     Mouth: Mucous membranes are moist.     Pharynx: Oropharynx is clear. No oropharyngeal exudate or posterior oropharyngeal erythema.  Eyes:     Extraocular Movements: Extraocular movements intact.     Conjunctiva/sclera: Conjunctivae normal.     Pupils: Pupils are equal, round, and reactive to light.  Neck:     Vascular: No carotid bruit.  Cardiovascular:     Rate and Rhythm: Normal rate and regular rhythm.     Pulses: Normal pulses.     Heart sounds: Normal heart sounds. No murmur heard.    No friction rub. No gallop.  Pulmonary:     Effort: Pulmonary effort is normal. No respiratory distress.     Breath sounds: Normal air entry. Examination of the left-lower field reveals rales. Rales present. No wheezing or rhonchi.    Chest:    Abdominal:     General: There is distension.     Palpations: Abdomen is soft.     Tenderness: There is no abdominal tenderness. There is no guarding.     Hernia: A hernia is present.  Musculoskeletal:        General: No swelling.     Cervical back: Normal range of motion and neck supple. No tenderness.     Right lower leg: No edema.     Left lower leg: No edema.  Lymphadenopathy:     Cervical: No cervical adenopathy.  Skin:    Coloration: Skin is not jaundiced  or pale.     Findings: No bruising,  erythema, lesion or rash.  Neurological:     General: No focal deficit present.     Mental Status: He is alert and oriented to person, place, and time. Mental status is at baseline.     Cranial Nerves: No cranial nerve deficit.     Sensory: No sensory deficit.     Motor: No weakness.     Coordination: Coordination normal.     Gait: Gait normal.  Psychiatric:        Mood and Affect: Mood normal.        Behavior: Behavior normal.        Thought Content: Thought content normal.        Judgment: Judgment normal.           Assessment & Plan:  Pleural effusion, left - Plan: DG Chest 2 View  I suspect that the patient has a pleural effusion related to community-acquired pneumonia.  Clinically he is improving.  Therefore I will repeat a chest x-ray today.  My tentative plan if the chest x-ray shows improvement is to repeat a CT scan in 1 month to ensure resolution given the abnormal dense atelectasis seen on the CT scan previously

## 2022-12-13 ENCOUNTER — Ambulatory Visit: Payer: PPO | Admitting: Family Medicine

## 2022-12-13 NOTE — Addendum Note (Signed)
Addended by: Randal Buba K on: 12/13/2022 11:28 AM   Modules accepted: Orders

## 2022-12-24 ENCOUNTER — Other Ambulatory Visit: Payer: Self-pay

## 2022-12-24 DIAGNOSIS — E119 Type 2 diabetes mellitus without complications: Secondary | ICD-10-CM

## 2022-12-24 MED ORDER — ONETOUCH ULTRA VI STRP: ORAL_STRIP | 12 refills | Status: DC

## 2022-12-24 MED ORDER — ONETOUCH DELICA LANCING DEV MISC: 1.0000 | 12 refills | Status: DC

## 2023-01-10 ENCOUNTER — Ambulatory Visit
Admission: RE | Admit: 2023-01-10 | Discharge: 2023-01-10 | Disposition: A | Discharge status: Home / Self Care | Payer: PPO | Source: Ambulatory Visit | Attending: Family Medicine | Admitting: Family Medicine

## 2023-01-10 DIAGNOSIS — J90 Pleural effusion, not elsewhere classified: Secondary | ICD-10-CM

## 2023-01-10 DIAGNOSIS — J984 Other disorders of lung: Secondary | ICD-10-CM | POA: Diagnosis not present

## 2023-01-10 DIAGNOSIS — R0781 Pleurodynia: Secondary | ICD-10-CM

## 2023-01-10 DIAGNOSIS — I700 Atherosclerosis of aorta: Secondary | ICD-10-CM | POA: Diagnosis not present

## 2023-01-10 DIAGNOSIS — R161 Splenomegaly, not elsewhere classified: Secondary | ICD-10-CM | POA: Diagnosis not present

## 2023-01-10 DIAGNOSIS — J949 Pleural condition, unspecified: Secondary | ICD-10-CM | POA: Diagnosis not present

## 2023-01-10 MED ORDER — IOPAMIDOL (ISOVUE-300) INJECTION 61%
75.0000 mL | Freq: Once | INTRAVENOUS | Status: AC | PRN
  Administered 2023-01-10: 75 mL via INTRAVENOUS

## 2023-01-17 DIAGNOSIS — M25671 Stiffness of right ankle, not elsewhere classified: Secondary | ICD-10-CM | POA: Diagnosis not present

## 2023-01-17 DIAGNOSIS — Z96661 Presence of right artificial ankle joint: Secondary | ICD-10-CM | POA: Diagnosis not present

## 2023-01-17 DIAGNOSIS — M12571 Traumatic arthropathy, right ankle and foot: Secondary | ICD-10-CM | POA: Diagnosis not present

## 2023-01-21 ENCOUNTER — Other Ambulatory Visit: Payer: Self-pay | Admitting: Orthopedic Surgery

## 2023-01-21 ENCOUNTER — Encounter: Payer: Self-pay | Admitting: Orthopedic Surgery

## 2023-01-21 DIAGNOSIS — M25671 Stiffness of right ankle, not elsewhere classified: Secondary | ICD-10-CM

## 2023-01-30 ENCOUNTER — Ambulatory Visit
Admission: RE | Admit: 2023-01-30 | Discharge: 2023-01-30 | Disposition: A | Discharge status: Home / Self Care | Payer: PPO | Source: Ambulatory Visit | Attending: Orthopedic Surgery | Admitting: Orthopedic Surgery

## 2023-01-30 DIAGNOSIS — Z471 Aftercare following joint replacement surgery: Secondary | ICD-10-CM | POA: Diagnosis not present

## 2023-01-30 DIAGNOSIS — Z96661 Presence of right artificial ankle joint: Secondary | ICD-10-CM | POA: Diagnosis not present

## 2023-01-30 DIAGNOSIS — M7731 Calcaneal spur, right foot: Secondary | ICD-10-CM | POA: Diagnosis not present

## 2023-01-30 DIAGNOSIS — I739 Peripheral vascular disease, unspecified: Secondary | ICD-10-CM | POA: Diagnosis not present

## 2023-01-30 DIAGNOSIS — M25671 Stiffness of right ankle, not elsewhere classified: Secondary | ICD-10-CM

## 2023-02-01 DIAGNOSIS — M545 Low back pain, unspecified: Secondary | ICD-10-CM | POA: Diagnosis not present

## 2023-02-01 DIAGNOSIS — S46812A Strain of other muscles, fascia and tendons at shoulder and upper arm level, left arm, initial encounter: Secondary | ICD-10-CM | POA: Diagnosis not present

## 2023-02-04 DIAGNOSIS — M25671 Stiffness of right ankle, not elsewhere classified: Secondary | ICD-10-CM | POA: Diagnosis not present

## 2023-02-04 DIAGNOSIS — M12571 Traumatic arthropathy, right ankle and foot: Secondary | ICD-10-CM | POA: Diagnosis not present

## 2023-02-04 DIAGNOSIS — Z96661 Presence of right artificial ankle joint: Secondary | ICD-10-CM | POA: Diagnosis not present

## 2023-02-05 ENCOUNTER — Ambulatory Visit (HOSPITAL_COMMUNITY)
Admission: RE | Admit: 2023-02-05 | Discharge: 2023-02-05 | Disposition: A | Discharge status: Home / Self Care | Payer: PPO | Source: Ambulatory Visit | Attending: Family Medicine | Admitting: Family Medicine

## 2023-02-05 ENCOUNTER — Encounter: Payer: Self-pay | Admitting: Family Medicine

## 2023-02-05 ENCOUNTER — Ambulatory Visit (INDEPENDENT_AMBULATORY_CARE_PROVIDER_SITE_OTHER): Payer: PPO | Admitting: Family Medicine

## 2023-02-05 VITALS — BP 132/76 | HR 69 | Temp 98.2°F | Ht 69.0 in | Wt 209.0 lb

## 2023-02-05 DIAGNOSIS — M542 Cervicalgia: Secondary | ICD-10-CM | POA: Diagnosis not present

## 2023-02-05 DIAGNOSIS — S134XXA Sprain of ligaments of cervical spine, initial encounter: Secondary | ICD-10-CM

## 2023-02-05 DIAGNOSIS — M545 Low back pain, unspecified: Secondary | ICD-10-CM

## 2023-02-05 DIAGNOSIS — M48061 Spinal stenosis, lumbar region without neurogenic claudication: Secondary | ICD-10-CM | POA: Diagnosis not present

## 2023-02-05 DIAGNOSIS — M47816 Spondylosis without myelopathy or radiculopathy, lumbar region: Secondary | ICD-10-CM | POA: Diagnosis not present
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
## 2023-02-11 DIAGNOSIS — E113393 Type 2 diabetes mellitus with moderate nonproliferative diabetic retinopathy without macular edema, bilateral: Secondary | ICD-10-CM | POA: Diagnosis not present

## 2023-04-03 ENCOUNTER — Encounter: Payer: PPO | Admitting: Family Medicine

## 2023-05-07 ENCOUNTER — Encounter: Payer: Self-pay | Admitting: Family Medicine

## 2023-05-07 ENCOUNTER — Ambulatory Visit (INDEPENDENT_AMBULATORY_CARE_PROVIDER_SITE_OTHER): Payer: PPO | Admitting: Family Medicine

## 2023-05-07 VITALS — BP 128/84 | HR 80 | Temp 97.6°F | Ht 69.0 in | Wt 206.0 lb

## 2023-05-07 DIAGNOSIS — Z0000 Encounter for general adult medical examination without abnormal findings: Secondary | ICD-10-CM | POA: Diagnosis not present

## 2023-05-07 MED ORDER — NAPROXEN 500 MG PO TBEC: 500.0000 mg | DELAYED_RELEASE_TABLET | Freq: Two times a day (BID) | ORAL | 0 refills | Status: DC

## 2023-05-07 NOTE — Progress Notes (Signed)
Subjective:   Kenneth Brown is a 72 y.o. male who presents for Medicare Annual/Subsequent preventive examination.  Visit Complete: In person  Patient Medicare AWV questionnaire was completed by the patient on 05/07/2023; I have confirmed that all information answered by patient is correct and no changes since this date.  Review of Systems    negative Cardiac Risk Factors include: advanced age (>38men, >8 women);male gender;obesity (BMI >30kg/m2)     Objective:    Today's Vitals   05/07/23 1133  BP: 128/84  Pulse: 80  Temp: 97.6 F (36.4 C)  TempSrc: Oral  SpO2: 97%  Weight: 206 lb (93.4 kg)  Height: 5\' 9"  (1.753 m)   Body mass index is 30.42 kg/m.     03/22/2022    9:56 AM 06/12/2021    9:23 AM 01/08/2018    8:51 AM 02/19/2017    1:13 PM 06/14/2016   10:00 AM 01/24/2016   10:23 AM 08/26/2015    2:19 PM  Advanced Directives  Does Patient Have a Medical Advance Directive? Yes Yes Yes Yes Yes Yes No  Type of Estate agent of [place];Living will Healthcare Power of [place];Living will Healthcare Power of [place];Living will Living will Healthcare Power of [place];Living will Healthcare Power of [place];Living will   Does patient want to make changes to medical advance directive?  No - Patient declined       Copy of Healthcare Power of Attorney in Chart? Yes - validated most recent copy scanned in chart (See row information) Yes - validated most recent copy scanned in chart (See row information) No - copy requested      Would patient like information on creating a medical advance directive?       No - patient declined information    Current Medications (verified) Outpatient Encounter Medications as of 05/07/2023  Medication Sig   B Complex-C-Biotin-D-Zinc-FA (VITAL-D RX PO) Take 50 mg by mouth daily.   blood glucose meter kit and supplies KIT Dispense based on patient and insurance preference. Use up to four times daily as directed.    empagliflozin (JARDIANCE) 25 MG TABS tablet Take 1 tablet (25 mg total) by mouth daily before breakfast.   glucose blood (ONETOUCH ULTRA) test strip Use as instructed   Lancet Devices (ONE TOUCH DELICA LANCING DEV) MISC 1 each by Does not apply route as directed.   Lancets (ONETOUCH DELICA PLUS LANCET33G) MISC Apply 1 each topically 4 (four) times daily.   [DISCONTINUED] naproxen (EC NAPROSYN) 500 MG EC tablet Take 500 mg by mouth 2 (two) times daily with a meal.   baclofen (LIORESAL) 10 MG tablet Take 10 mg by mouth 3 (three) times daily. (Patient not taking: Reported on 05/07/2023)   naproxen (EC NAPROSYN) 500 MG EC tablet Take 1 tablet (500 mg total) by mouth 2 (two) times daily with a meal.   [DISCONTINUED] HYDROcodone-acetaminophen (NORCO) 5-325 MG tablet Take 1 tablet by mouth every 6 (six) hours as needed for moderate pain. (Patient not taking: Reported on 05/07/2023)   No facility-administered encounter medications on file as of 05/07/2023.    Allergies (verified) Patient has no known allergies.   History: Past Medical History:  Diagnosis Date   Acute cystitis    Arthritis    ankle   Cirrhosis of liver (HCC)    Diabetes mellitus without complication (HCC)    pre diabetic   Difficulty urinating    Gross hematuria    Platelets decreased (HCC)    Renal calculus, bilateral  UTI (urinary tract infection) 10/2014   Past Surgical History:  Procedure Laterality Date   ANKLE SURGERY  08/28/2012   COLONOSCOPY N/A 06/12/2021   Procedure: COLONOSCOPY with BIOPSY;  Surgeon: Midge Minium, MD;  Location: [facility];  Service: Endoscopy;  Laterality: N/A;   COLONOSCOPY WITH PROPOFOL N/A 07/08/2015   Procedure: COLONOSCOPY WITH PROPOFOL;  Surgeon: Midge Minium, MD;  Location: [facility];  Service: Endoscopy;  Laterality: N/A;   ESOPHAGOGASTRODUODENOSCOPY (EGD) WITH PROPOFOL N/A 07/08/2015   Procedure: ESOPHAGOGASTRODUODENOSCOPY (EGD) WITH PROPOFOL;  Surgeon: Midge Minium, MD;   Location: [facility];  Service: Endoscopy;  Laterality: N/A;   FRACTURE SURGERY N/A    Phreesia 01/30/2021   JOINT REPLACEMENT N/A    Phreesia 01/30/2021   ORIF ANKLE FRACTURE Right    plate and screws, pt still has   POLYPECTOMY  07/08/2015   Procedure: POLYPECTOMY;  Surgeon: Midge Minium, MD;  Location: [facility];  Service: Endoscopy;;   POLYPECTOMY N/A 06/12/2021   Procedure: POLYPECTOMY;  Surgeon: Midge Minium, MD;  Location: [facility];  Service: Endoscopy;  Laterality: N/A;   URETEROSCOPY WITH HOLMIUM LASER LITHOTRIPSY Right 03/21/2015   Procedure: URETEROSCOPY WITH HOLMIUM LASER LITHOTRIPSY,retrograde pyelogram,stent placement;  Surgeon: Lorraine Lax, MD;  Location: [facility] OR[facility];  Service: Urology;  Laterality: Right;   Family History  Problem Relation Age of Onset   Hypertension Mother    Heart disease Mother    Stroke Mother    COPD Mother    Heart disease Father    Heart attack Father    Lung cancer Father    Skin cancer Father    Social History   Socioeconomic History   Marital status: Married    Spouse name: Not on file   Number of children: 1   Years of education: Not on file   Highest education level: Some college, no degree  Occupational History   Occupation: part time Holiday representative  Tobacco Use   Smoking status: Never   Smokeless tobacco: Never  Vaping Use   Vaping Use: Never used  Substance and Sexual Activity   Alcohol use: Yes    Alcohol/week: 0.0 - 1.0 standard drinks of alcohol    Comment: 1-2 drinks a month   Drug use: No   Sexual activity: Yes  Other Topics Concern   Not on file  Social History Narrative   Not on file   Social Determinants of Health   Financial Resource Strain: Low Risk  (05/07/2023)   Overall Financial Resource Strain (CARDIA)    Difficulty of Paying Living Expenses: Not hard at all  Food Insecurity: No Food Insecurity (05/07/2023)   Hunger Vital Sign    Worried About Running Out of Food in the  Last Year: Never true    Ran Out of Food in the Last Year: Never true  Transportation Needs: No Transportation Needs (05/07/2023)   PRAPARE - Administrator, Civil Service (Medical): No    Lack of Transportation (Non-Medical): No  Physical Activity: Sufficiently Active (05/07/2023)   Exercise Vital Sign    Days of Exercise per Week: 5 days    Minutes of Exercise per Session: 30 min  Stress: No Stress Concern Present (05/07/2023)   [employer] of Occupational Health - Occupational Stress Questionnaire    Feeling of Stress : Not at all  Social Connections: Moderately Integrated (05/07/2023)   Social Connection and Isolation Panel [NHANES]    Frequency of Communication with Friends and Family: More than  three times a week    Frequency of Social Gatherings with Friends and Family: More than three times a week    Attends Religious Services: More than 4 times per year    Active Member of [employer] or Organizations: No    Attends Banker Meetings: Never    Marital Status: Married    Tobacco Counseling Counseling given: Yes   Clinical Intake:  Pre-visit preparation completed: No        Nutritional Risks: None Diabetes: No            Activities of Daily Living    05/07/2023   11:49 AM  In your present state of health, do you have any difficulty performing the following activities:  Hearing? 0  Vision? 0  Difficulty concentrating or making decisions? 0  Walking or climbing stairs? 0  Dressing or bathing? 0  Doing errands, shopping? 0  Preparing Food and eating ? N  Using the Toilet? N  In the past six months, have you accidently leaked urine? N  Do you have problems with loss of bowel control? N  Managing your Medications? N  Managing your Finances? N  Housekeeping or managing your Housekeeping? N    Patient Care Team: Donita Brooks, MD as PCP - General (Family Medicine) Midge Minium, MD as Consulting Physician (Gastroenterology) Irene Limbo., MD as Consulting Physician (Ophthalmology) Winfred Leeds Basilia Jumbo, MD as Referring Physician (Orthopedic Surgery) Erroll Luna, [facility] (Inactive) as Pharmacist (Pharmacist)  Indicate any recent Medical Services you may have received from other than [facility] providers in the past year (date may be approximate).     Assessment:   This is a routine wellness examination for Jamelle.  Hearing/Vision screen No results found.  Dietary issues and exercise activities discussed:     Goals Addressed             This Visit's Progress    DIET - INCREASE WATER INTAKE   On track    Continue drinking 6-8 glasses of water a day and current diet regimen.      Exercise    On track    Recommend walking 3 days a week for 20-30 minutes.     Monitor and Manage My Blood Sugar-Diabetes Type 2   On track    Timeframe:  Long-Range Goal Priority:  High Start Date: 07/12/22                             Expected End Date: 01/09/22                       Follow Up Date 10/11/22    - check blood sugar at prescribed times    Why is this important?   Checking your blood sugar at home helps to keep it from getting very high or very low.  Writing the results in a diary or log helps the doctor know how to care for you.  Your blood sugar log should have the time, date and the results.  Also, write down the amount of insulin or other medicine that you take.  Other information, like what you ate, exercise done and how you were feeling, will also be helpful.     Notes:        Depression Screen    05/07/2023   11:53 AM 12/04/2022    9:10 AM 11/13/2022    8:53 AM 03/22/2022  9:54 AM 06/15/2021   10:24 AM 03/15/2021    9:52 AM 01/31/2021    9:16 AM  PHQ 2/9 Scores  PHQ - 2 Score 0 0 0 0 0 0 0  PHQ- 9 Score     0 1 1    Fall Risk    12/04/2022    9:10 AM 11/13/2022    8:53 AM 03/22/2022    9:56 AM 06/15/2021   10:24 AM 03/15/2021    9:51 AM  Fall Risk   Falls in the past year? 0 0 0 0 0  Number  falls in past yr: 0 0 0 0 0  Injury with Fall? 0 0 0 0 0  Risk for fall due to : No Fall Risks No Fall Risks No Fall Risks    Follow up Falls prevention discussed Falls prevention discussed Falls prevention discussed Falls evaluation completed Falls evaluation completed    MEDICARE RISK AT HOME:   TIMED UP AND GO:  Was the test performed?  Yes  Length of time to ambulate 10 feet: 10 sec Gait steady and fast without use of assistive device    Cognitive Function:        03/22/2022    9:59 AM 03/15/2021    9:48 AM 02/19/2017    1:17 PM  6CIT Screen  What Year? 0 points 0 points 0 points  What month? 0 points 0 points 0 points  What time? 0 points 0 points 0 points  Count back from 20 0 points 0 points 0 points  Months in reverse 0 points 2 points 0 points  Repeat phrase 2 points 0 points 0 points  Total Score 2 points 2 points 0 points    Immunizations Immunization History  Administered Date(s) Administered   Fluad Quad(high Dose 65+) 08/10/2019   Influenza, High Dose Seasonal PF 10/03/2016, 08/26/2017, 09/03/2018   Influenza,inj,quad, With Preservative 07/29/2016   Moderna Sars-Covid-2 Vaccination 12/26/2019, 08/25/2020, 03/13/2021   Pneumococcal Conjugate-13 01/24/2016   Pneumococcal Polysaccharide-23 02/19/2017   Tdap 05/20/2013   Zoster, Live 05/20/2013    TDAP status: Up to date  Flu Vaccine status: Up to date  Pneumococcal vaccine status: Up to date  Covid-19 vaccine status: Information provided on how to obtain vaccines.   Qualifies for Shingles Vaccine? Yes   Zostavax completed Yes  at [facility] Shingrix Completed?: Yes  Screening Tests Health Maintenance  Topic Date Due   COVID-19 Vaccine (4 - 2023-24 season) 06/29/2022   HEMOGLOBIN A1C  05/14/2023   DTaP/Tdap/Td (2 - Td or Tdap) 05/21/2023   INFLUENZA VACCINE  05/30/2023   OPHTHALMOLOGY EXAM  06/22/2023   Diabetic kidney evaluation - eGFR measurement  11/14/2023   Diabetic kidney  evaluation - Urine ACR  11/14/2023   FOOT EXAM  11/14/2023   Medicare Annual Wellness (AWV)  05/06/2024   Colonoscopy  06/12/2028   Pneumonia Vaccine 32+ Years old  Completed   Hepatitis C Screening  Completed   HPV VACCINES  Aged Out   Zoster Vaccines- Shingrix  Discontinued    Health Maintenance  Health Maintenance Due  Topic Date Due   COVID-19 Vaccine (4 - 2023-24 season) 06/29/2022    Colorectal cancer screening: Type of screening: Colonoscopy. Completed 2022. Repeat every 7 years  Lung Cancer Screening: (Low Dose CT Chest recommended if Age 45-80 years, 20 pack-year currently smoking OR have quit w/in 15years.) does not qualify.   Lung Cancer Screening Referral: n/a  Additional Screening:  Hepatitis C Screening: does qualify; Completed  2022  Vision Screening: Recommended annual ophthalmology exams for early detection of glaucoma and other disorders of the eye. Is the patient up to date with their annual eye exam?  Yes  Who is the provider or what is the name of the office in which the patient attends annual eye exams? [facility] If pt is not established with a provider, would they like to be referred to a provider to establish care? No .   Dental Screening: Recommended annual dental exams for proper oral hygiene  Diabetic Foot Exam: Diabetic Foot Exam: Completed 11-13-2022  Community Resource Referral / Chronic Care Management: CRR required this visit?  No   CCM required this visit?  No     Plan:     I have personally reviewed and noted the following in the patient's chart:   Medical and social history Use of alcohol, tobacco or illicit drugs  Current medications and supplements including opioid prescriptions. Patient is currently taking opioid prescriptions. Information provided to patient regarding non-opioid alternatives. Patient advised to discuss non-opioid treatment plan with their provider. Functional ability and status Nutritional status Physical  activity Advanced directives List of other physicians Hospitalizations, surgeries, and ER visits in previous 12 months Vitals Screenings to include cognitive, depression, and falls Referrals and appointments  In addition, I have reviewed and discussed with patient certain preventive protocols, quality metrics, and best practice recommendations. A written personalized care plan for preventive services as well as general preventive health recommendations were provided to patient.     Park Meo, FNP   05/07/2023   After Visit Summary: Provided  Nurse Notes: none

## 2023-05-13 ENCOUNTER — Other Ambulatory Visit: Payer: Self-pay | Admitting: Family Medicine

## 2023-05-13 ENCOUNTER — Telehealth: Payer: Self-pay | Admitting: Family Medicine

## 2023-05-13 MED ORDER — NIRMATRELVIR/RITONAVIR (PAXLOVID)TABLET: 3.0000 | ORAL_TABLET | Freq: Two times a day (BID) | ORAL | 0 refills | Status: AC

## 2023-05-13 NOTE — Telephone Encounter (Signed)
Patient called to report positive COVID test taken yesterday afternoon. Requesting for provider to call in prescription.  Pharmacy listed as:  [facility] - [place], [place] - [address], [place] [address] Phone: [phone]  Fax: [phone]   Please advise patient when script sent in at [phone].

## 2023-07-11 ENCOUNTER — Encounter: Payer: Self-pay | Admitting: Family Medicine

## 2023-07-11 ENCOUNTER — Ambulatory Visit (INDEPENDENT_AMBULATORY_CARE_PROVIDER_SITE_OTHER): Payer: PPO | Admitting: Family Medicine

## 2023-07-11 VITALS — BP 130/80 | HR 81 | Temp 97.8°F | Ht 69.0 in | Wt 210.0 lb

## 2023-07-11 DIAGNOSIS — S50912A Unspecified superficial injury of left forearm, initial encounter: Secondary | ICD-10-CM | POA: Diagnosis not present

## 2023-07-11 DIAGNOSIS — R58 Hemorrhage, not elsewhere classified: Secondary | ICD-10-CM | POA: Insufficient documentation

## 2023-07-11 NOTE — Progress Notes (Signed)
Subjective:  HPI: Kenneth Brown is a 72 y.o. male presenting on 07/11/2023 for Acute Visit (L-arm bleeding from bruises per wife)   HPI Patient is in today for left arm injury. He reports some bleeding from a bruise on his left forearm yesterday and today. 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 No swelling, warmth, or purulent drainage. Has tried vaseline.  Review of Systems  All other systems reviewed and are negative.   Relevant past medical history reviewed and updated as indicated.   Past Medical History:  Diagnosis Date   Acute cystitis    Arthritis    ankle   Cirrhosis of liver (HCC)    Diabetes mellitus without complication (HCC)    pre diabetic   Difficulty urinating    Gross hematuria    Platelets decreased (HCC)    Renal calculus, bilateral    UTI (urinary tract infection) 10/2014     Past Surgical History:  Procedure Laterality Date   ANKLE SURGERY  08/28/2012   COLONOSCOPY N/A 06/12/2021   Procedure: COLONOSCOPY with BIOPSY;  Surgeon: Midge Minium, MD;  Location: [facility];  Service: Endoscopy;  Laterality: N/A;   COLONOSCOPY WITH PROPOFOL N/A 07/08/2015   Procedure: COLONOSCOPY WITH PROPOFOL;  Surgeon: Midge Minium, MD;  Location: [facility];  Service: Endoscopy;  Laterality: N/A;   ESOPHAGOGASTRODUODENOSCOPY (EGD) WITH PROPOFOL N/A 07/08/2015   Procedure: ESOPHAGOGASTRODUODENOSCOPY (EGD) WITH PROPOFOL;  Surgeon: Midge Minium, MD;  Location: [facility];  Service: Endoscopy;  Laterality: N/A;   FRACTURE SURGERY N/A    Phreesia 01/30/2021   JOINT REPLACEMENT N/A    Phreesia 01/30/2021   ORIF ANKLE FRACTURE Right    plate and screws, pt still has   POLYPECTOMY  07/08/2015   Procedure: POLYPECTOMY;  Surgeon: Midge Minium, MD;  Location: [facility];  Service: Endoscopy;;   POLYPECTOMY N/A 06/12/2021   Procedure: POLYPECTOMY;  Surgeon: Midge Minium,  MD;  Location: [facility];  Service: Endoscopy;  Laterality: N/A;   URETEROSCOPY WITH HOLMIUM LASER LITHOTRIPSY Right 03/21/2015   Procedure: URETEROSCOPY WITH HOLMIUM LASER LITHOTRIPSY,retrograde pyelogram,stent placement;  Surgeon: Lorraine Lax, MD;  Location: [facility];  Service: Urology;  Laterality: Right;    Allergies and medications reviewed and updated.   Current Outpatient Medications:    B Complex-C-Biotin-D-Zinc-FA (VITAL-D RX PO), Take 50 mg by mouth daily., Disp: , Rfl:    blood glucose meter kit and supplies KIT, Dispense based on patient and insurance preference. Use up to four times daily as directed., Disp: 1 each, Rfl: 0   empagliflozin (JARDIANCE) 25 MG TABS tablet, Take 1 tablet (25 mg total) by mouth daily before breakfast., Disp: 90 tablet, Rfl: 3   glucose blood (ONETOUCH ULTRA) test strip, Use as instructed, Disp: 100 each, Rfl: 12   Lancet Devices (ONE TOUCH DELICA LANCING DEV) MISC, 1 each by Does not apply route as directed., Disp: 100 each, Rfl: 12   Lancets (ONETOUCH DELICA PLUS LANCET33G) MISC, Apply 1 each topically 4 (four) times daily., Disp: , Rfl:    naproxen (EC NAPROSYN) 500 MG EC tablet, Take 1 tablet (500 mg total) by mouth 2 (two) times daily with a meal., Disp: 30 tablet, Rfl: 0   baclofen (LIORESAL) 10 MG tablet, Take 10 mg by mouth 3 (three) times daily. (Patient not taking: Reported on 05/07/2023), Disp: , Rfl:   No Known Allergies  Objective:   BP 130/80   Pulse 81   Temp 97.8 F (36.6 C) (Oral)   Ht 5\' 9"  (1.753 m)   Wt 210 lb (95.3 kg)   SpO2 95%   BMI 31.01 kg/m      07/11/2023    4:16 PM 05/07/2023   11:33 AM 02/05/2023    9:14 AM  Vitals with BMI  Height 5\' 9"  5\' 9"  5\' 9"   Weight 210 lbs 206 lbs 209 lbs  BMI 31 30.41 30.85  Systolic 130 128 161  Diastolic 80 84 76  Pulse 81 80 69     Physical Exam Vitals and nursing note reviewed.  Constitutional:      Appearance: Normal appearance. He is normal weight.  HENT:      Head: Normocephalic and atraumatic.  Skin:    General: Skin is warm and dry.     Capillary Refill: Capillary refill takes less than 2 seconds.     Findings: Ecchymosis present.       Neurological:     General: No focal deficit present.     Mental Status: He is alert and oriented to person, place, and time. Mental status is at baseline.  Psychiatric:        Mood and Affect: Mood normal.        Behavior: Behavior normal.        Thought Content: Thought content normal.        Judgment: Judgment normal.     Assessment & Plan:  Ecchymosis of forearm Assessment & Plan: Patient is here today with bruising and scant sanguinous drainage from a punctate opening on his left forearm. I cleansed the area with saline and covered with triple antibiotic ointment and a Mepitel. I then covered this with gauze and wrapped with Kerlix to protect his clothing. Return to office for swelling, warmth, redness, purulent drainage. Did have CBC earlier this year and he denies recurrent bleeding.      Follow up plan: Return if symptoms worsen or fail to improve.  Park Meo, FNP

## 2023-07-11 NOTE — Assessment & Plan Note (Signed)
Patient is here today with bruising and scant sanguinous drainage from a punctate opening on his left forearm. I cleansed the area with saline and covered with triple antibiotic ointment and a Mepitel. I then covered this with gauze and wrapped with Kerlix to protect his clothing. Return to office for swelling, warmth, redness, purulent drainage. Did have CBC earlier this year and he denies recurrent bleeding.

## 2023-08-01 DIAGNOSIS — E113393 Type 2 diabetes mellitus with moderate nonproliferative diabetic retinopathy without macular edema, bilateral: Secondary | ICD-10-CM | POA: Diagnosis not present

## 2023-08-01 LAB — HM DIABETES EYE EXAM

## 2023-08-20 ENCOUNTER — Telehealth: Payer: Self-pay

## 2023-08-20 NOTE — Telephone Encounter (Signed)
Pt's wife called in to ask for info to contact someone for Patient Assistance Program please.  Cb#: [phone] Ok to lvm with info

## 2023-08-22 ENCOUNTER — Telehealth: Payer: Self-pay

## 2023-08-22 ENCOUNTER — Other Ambulatory Visit: Payer: Self-pay

## 2023-08-22 DIAGNOSIS — E119 Type 2 diabetes mellitus without complications: Secondary | ICD-10-CM

## 2023-08-22 NOTE — Progress Notes (Signed)
   Care Guide Note  08/22/2023 Name: Kenneth Brown MRN: [id number] DOB: 06/13/1951  Referred by: Donita Brooks, MD Reason for referral : Care Coordination (Outreach to schedule with pharm d )   Kenneth Brown is a 72 y.o. year old male who is a primary care patient of Tanya Nones, Priscille Heidelberg, MD. [name] was referred to the pharmacist for assistance related to DM.    Successful contact was made with the patient to discuss pharmacy services including being ready for the pharmacist to call at least 5 minutes before the scheduled appointment time, to have medication bottles and any blood sugar or blood pressure readings ready for review. The patient agreed to meet with the pharmacist via with the pharmacist via telephone visit on (date/time).  09/11/2023  Penne Lash, RMA Care Guide [facility]  [place], [place] [address] Direct Dial: [phone] [email or web address]

## 2023-09-11 ENCOUNTER — Encounter: Payer: Self-pay | Admitting: Pharmacist

## 2023-09-11 ENCOUNTER — Telehealth: Payer: Self-pay

## 2023-09-11 ENCOUNTER — Other Ambulatory Visit: Payer: PPO | Admitting: Pharmacist

## 2023-09-11 NOTE — Progress Notes (Signed)
09/11/2023 Name: Kenneth Brown MRN: [id number] DOB: 26-Dec-1950  Chief Complaint  Patient presents with   Medication Assistance    Jardiance    Kenneth Brown is a 72 y.o. year old male who presented for a telephone visit.   They were referred to the pharmacist by their PCP for assistance in managing medication access.    Subjective:  Care Team: Primary Care Provider: Donita Brooks, MD   Medication Access/Adherence  Current Pharmacy:  [facility] - [place], [place] - [phone], [address] Kenneth Brown [place] [address] Phone: [phone] Fax: [phone]  [facility] - [place], [place] - [address] [place] [address] Phone: [phone] Fax: [phone]   Patient reports affordability concerns with their medications: Yes  Patient reports access/transportation concerns to their pharmacy: No  Patient reports adherence concerns with their medications:  No    Diabetes:  Current medications: Jardiance Medications tried in the past:   Current glucose readings: reports controlled; wife states he is doing well on Jardiance  Current physical activity: active; encouraged as able  Current medication access support: BI cares --needs to re-enroll   Objective:  Lab Results  Component Value Date   HGBA1C 5.8 (H) 11/13/2022    Lab Results  Component Value Date   CREATININE 1.00 11/13/2022   BUN 14 11/13/2022   NA 139 11/13/2022   K 3.6 11/13/2022   CL 103 11/13/2022   CO2 26 11/13/2022    Lab Results  Component Value Date   CHOL 174 11/13/2022   HDL 47 11/13/2022   LDLCALC 101 (H) 11/13/2022   TRIG 160 (H) 11/13/2022   CHOLHDL 3.7 11/13/2022    Medications Reviewed Today     Reviewed by Kenneth Brown, [facility] (Pharmacist) on 09/11/23 at 1504  Med List Status: <None>   Medication Order Taking? Sig Documenting Provider Last Dose Status Informant  B  Complex-C-Biotin-D-Zinc-FA (VITAL-D RX PO) [id number] No Take 50 mg by mouth daily. [provider] Taking Active   baclofen (LIORESAL) 10 MG tablet [id number] No Take 10 mg by mouth 3 (three) times daily.  Patient not taking: Reported on 05/07/2023   [provider] Not Taking Active   blood glucose meter kit and supplies KIT [id number] No Dispense based on patient and insurance preference. Use up to four times daily as directed. Kenneth Brooks, MD Taking Active   empagliflozin (JARDIANCE) 25 MG TABS tablet [id number] No Take 1 tablet (25 mg total) by mouth daily before breakfast. Kenneth Brooks, MD Taking Active   glucose blood [facility] ULTRA) test strip [id number] No Use as instructed Kenneth Brooks, MD Taking Active   Lancet Devices (ONE [facility] DELICA LANCING DEV) MISC [id number] No 1 each by Does not apply route as directed. Kenneth Brooks, MD Taking Active   Lancets [facility] PLUS [place]) MISC [id number] No Apply 1 each topically 4 (four) times daily. [provider] Taking Active   naproxen (EC NAPROSYN) 500 MG EC tablet [id number] No Take 1 tablet (500 mg total) by mouth 2 (two) times daily with a meal. Kenneth Meo, FNP Taking Active               Assessment/Plan:   Diabetes: - Currently controlled---A1c<6% - answered questions re: BI cares patient assistance for Jardiance  - patient to return PAP to PCP office - Will have PCP escribe jardiance to [facility] for BI cares PAP  Follow Up  Plan: as needed  Kenneth Brown, PharmD, BCACP, CPP Clinical Pharmacist, [facility]

## 2023-09-11 NOTE — Telephone Encounter (Signed)
Pt came in to bring Patient Assistance forms to be completed by pcp. Please call pt once ppw completed and faxed. Intake form and ppw placed in nurse's folder.  Cb#: [phone]

## 2023-09-18 NOTE — Telephone Encounter (Signed)
Forms completed and signed at no charge.  Successfully faxed to [employer] with a confirmation time stamp of 09-18-2023 09:40.  Also successfully faxed to [facility] with a confirmation time stamp of 09-18-2023 09:45.  Outbound call placed to patient to advise. Left message to return call to find out if patient wants to pick up originals or have [place] send them to him through carrier mail. Forms placed in folder at front desk.  Awaiting call back.

## 2023-09-18 NOTE — Telephone Encounter (Signed)
Patient's daughter returned call; stated she needs to add additional paperwork to the forms we have so everything can be faxed together.   Stated she will bring the other forms up here either today or tomorrow; advised her of our office hours.

## 2023-09-19 NOTE — Telephone Encounter (Signed)
Patient's daughter brought additional paperwork to add to Patient assistance application.  All forms (8 pages) successfully faxed to [employer] [phone]) with a confirmation time stamp of 09-19-2023 10:11.  Also successfully faxed all forms (12 pages) to [facility] with a confirmation time stamp of 09-19-2023 10:17.  Gave all original paperwork to patient's daughter.

## 2023-11-18 ENCOUNTER — Encounter: Payer: Self-pay | Admitting: Family Medicine

## 2023-11-18 ENCOUNTER — Ambulatory Visit (INDEPENDENT_AMBULATORY_CARE_PROVIDER_SITE_OTHER): Payer: PPO | Admitting: Family Medicine

## 2023-11-18 VITALS — BP 122/76 | HR 86 | Temp 100.6°F | Ht 69.0 in | Wt 211.4 lb

## 2023-11-18 DIAGNOSIS — J101 Influenza due to other identified influenza virus with other respiratory manifestations: Secondary | ICD-10-CM

## 2023-11-18 DIAGNOSIS — Z20828 Contact with and (suspected) exposure to other viral communicable diseases: Secondary | ICD-10-CM

## 2023-11-18 LAB — INFLUENZA A AND B AG, IMMUNOASSAY
INFLUENZA A ANTIGEN: DETECTED — AB
INFLUENZA B ANTIGEN: NOT DETECTED

## 2023-11-18 MED ORDER — HYDROCODONE BIT-HOMATROP MBR 5-1.5 MG/5ML PO SOLN
5.0000 mL | Freq: Three times a day (TID) | ORAL | 0 refills | Status: DC | PRN
Start: 2023-11-18 — End: 2024-04-07

## 2023-11-18 MED ORDER — BENZONATATE 100 MG PO CAPS
100.0000 mg | ORAL_CAPSULE | Freq: Three times a day (TID) | ORAL | 0 refills | Status: DC | PRN
Start: 2023-11-18 — End: 2024-04-07

## 2023-11-18 MED ORDER — OSELTAMIVIR PHOSPHATE 75 MG PO CAPS
75.0000 mg | ORAL_CAPSULE | Freq: Two times a day (BID) | ORAL | 0 refills | Status: AC
Start: 2023-11-18 — End: 2023-11-23

## 2023-11-18 NOTE — Progress Notes (Unsigned)
Patient Office Visit  Assessment & Plan:  Type A influenza -     Influenza A and B Ag, Immunoassay -     Oseltamivir Phosphate; Take 1 capsule (75 mg total) by mouth 2 (two) times daily for 5 days.  Dispense: 10 capsule; Refill: 0 -     Benzonatate; Take 1 capsule (100 mg total) by mouth 3 (three) times daily as needed for cough.  Dispense: 30 capsule; Refill: 0 -     HYDROcodone Bit-Homatrop MBr; Take 5 mLs by mouth every 8 (eight) hours as needed for cough.  Dispense: 120 mL; Refill: 0  Exposure to the flu -     Influenza A and B Ag, Immunoassay -     Oseltamivir Phosphate; Take 1 capsule (75 mg total) by mouth 2 (two) times daily for 5 days.  Dispense: 10 capsule; Refill: 0 -     Benzonatate; Take 1 capsule (100 mg total) by mouth 3 (three) times daily as needed for cough.  Dispense: 30 capsule; Refill: 0 -     HYDROcodone Bit-Homatrop MBr; Take 5 mLs by mouth every 8 (eight) hours as needed for cough.  Dispense: 120 mL; Refill: 0    Return if symptoms worsen or fail to improve.  Meds sent to pharmacy, if not better he will contact us.   Subjective:    Patient ID: Kenneth Brown, male    DOB: October 26, 1951  Age: 73 y.o. MRN: [id number]  Chief Complaint  Patient presents with   Cough    Symptoms started late Friday evening.   Nasal Congestion    Symptoms started late Friday evening    HPI Possible flu type A Exposure to Flu from grand daughter this past weekend.  Having fever, chills, sore throat,congestion,  fatigue and myalgias since Friday. Grand daughter dx with Flu Type A today. Pt got flu shot this season. Having productive cough since Friday, no wheezing, no SOB.  Took OTC Equate cold and cough few hours ago to help with fever.  Normal temp at home. Pt did not sleep very much over the weekend. Took rock and rye with sugar and honey and was able to sleep last night.  Good appetite and is able to eat OK. No nausea or vomiting but having diarrhea today 3-4 times (today).  No tobacco use. Pt did have   The 10-year ASCVD risk score (Arnett DK, et al., 2019) is: 34.6%    ROS    Objective:    BP 122/76 (BP Location: Left Arm)   Pulse 86   Temp (!) 100.6 F (38.1 C)   Ht 5\' 9"  (1.753 m)   Wt 211 lb 6 oz (95.9 kg)   SpO2 98%   BMI 31.21 kg/m  BP Readings from Last 3 Encounters:  11/18/23 122/76  07/11/23 130/80  05/07/23 128/84   Wt Readings from Last 3 Encounters:  11/18/23 211 lb 6 oz (95.9 kg)  07/11/23 210 lb (95.3 kg)  05/07/23 206 lb (93.4 kg)    Physical Exam Vitals and nursing note reviewed.  Constitutional:      Appearance: Normal appearance.  HENT:     Head: Normocephalic.     Right Ear: Tympanic membrane, ear canal and external ear normal.     Left Ear: Tympanic membrane, ear canal and external ear normal.     Nose: Congestion present.     Mouth/Throat:     Comments: Postnasal drip Eyes:     Extraocular Movements: Extraocular movements intact.  Conjunctiva/sclera: Conjunctivae normal.     Pupils: Pupils are equal, round, and reactive to light.  Cardiovascular:     Rate and Rhythm: Normal rate and regular rhythm.     Heart sounds: Normal heart sounds.  Pulmonary:     Effort: Pulmonary effort is normal. No respiratory distress.     Breath sounds: Normal breath sounds. No wheezing.  Musculoskeletal:     Right lower leg: No edema.     Left lower leg: No edema.  Neurological:     General: No focal deficit present.     Mental Status: He is alert and oriented to person, place, and time.  Psychiatric:        Mood and Affect: Mood normal.        Behavior: Behavior normal.        Thought Content: Thought content normal.      Results for orders placed or performed in visit on 11/18/23  Influenza A and B Ag, Immunoassay  Result Value Ref Range   Source: NASOPHARYNX    INFLUENZA A ANTIGEN DETECTED (A) NOT DETECTED   INFLUENZA B ANTIGEN NOT DETECTED NOT DETECTED

## 2023-11-19 ENCOUNTER — Encounter: Payer: Self-pay | Admitting: Family Medicine

## 2023-12-26 ENCOUNTER — Other Ambulatory Visit: Payer: Self-pay | Admitting: Family Medicine

## 2023-12-26 DIAGNOSIS — E119 Type 2 diabetes mellitus without complications: Secondary | ICD-10-CM

## 2023-12-26 NOTE — Telephone Encounter (Signed)
 Copied from CRM [id number]. Topic: Clinical - Medication Refill >> Dec 26, 2023 10:14 AM Maxwell Marion wrote: Most Recent Primary Care Visit:  Provider: Bernadette Hoit  Department: [facility]  Visit Type: ACUTE  Date: 11/18/2023  Medication: blood glucose meter kit and supplies KIT (OneTouch Ultra 2 Device Kit)  Has the patient contacted their pharmacy? Yes (Agent: If no, request that the patient contact the pharmacy for the refill. If patient does not wish to contact the pharmacy document the reason why and proceed with request.) (Agent: If yes, when and what did the pharmacy advise?) advised patient contact doctor for new prescription  Is this the correct pharmacy for this prescription? Yes If no, delete pharmacy and type the correct one.  This is the patient's preferred pharmacy:  [facility] - [place], [place] - [address] [place] [address] Phone: [phone] Fax: [phone]   Has the prescription been filled recently? No  Is the patient out of the medication? Yes  Has the patient been seen for an appointment in the last year OR does the patient have an upcoming appointment? Yes  Can we respond through MyChart? Yes  Agent: Please be advised that Rx refills may take up to 3 business days. We ask that you follow-up with your pharmacy.

## 2023-12-27 MED ORDER — BLOOD GLUCOSE MONITOR KIT
PACK | 0 refills | Status: DC
Start: 2023-12-27 — End: 2024-01-02

## 2023-12-27 NOTE — Telephone Encounter (Signed)
 Requested Prescriptions  Pending Prescriptions Disp Refills   blood glucose meter kit and supplies KIT 1 each 0    Sig: Dispense based on patient and insurance preference. Use up to four times daily as directed.     Endocrinology: Diabetes - Testing Supplies Failed - 12/27/2023 11:39 AM      Failed - Valid encounter within last 12 months    Recent Outpatient Visits           2 years ago Type 2 diabetes mellitus without complication, without long-term current use of insulin (HCC)   [facility], Priscille Heidelberg, MD   5 years ago URI, acute   [facility] [place], [place]   5 years ago Borderline diabetes   [facility] Maple Hudson., MD   5 years ago Pre-op exam   [facility] Maple Hudson., MD   5 years ago Borderline diabetes   [facility] Maple Hudson., MD

## 2023-12-30 ENCOUNTER — Telehealth: Payer: Self-pay | Admitting: Family Medicine

## 2023-12-30 ENCOUNTER — Other Ambulatory Visit: Payer: Self-pay

## 2023-12-30 DIAGNOSIS — E119 Type 2 diabetes mellitus without complications: Secondary | ICD-10-CM

## 2023-12-30 MED ORDER — ONETOUCH ULTRA VI STRP
ORAL_STRIP | 12 refills | Status: AC
Start: 2023-12-30 — End: ?

## 2023-12-30 MED ORDER — ONETOUCH DELICA LANCING DEV MISC
12 refills | Status: AC
Start: 2023-12-30 — End: ?

## 2023-12-30 NOTE — Telephone Encounter (Signed)
 [facility] called about Diabetes supplies. Pharmacy tech states they never received an order from 12/27/2023 for strips and lancets. Patient called this morning and he is completed out.

## 2023-12-30 NOTE — Telephone Encounter (Signed)
 Copied from CRM [id number]. Topic: Clinical - Prescription Issue >> Dec 30, 2023  8:26 AM Franchot Heidelberg wrote: Reason for CRM: Pt called back, EPIC will not let me addend the telephone encounter I created from today. I attached the refill request from last week but after copying and pasting it did not transfer the medication/pharmacy information. EPIC has never done this before. And now I can't addend the telephone encounter, it wont let me edit it at all.

## 2023-12-30 NOTE — Telephone Encounter (Unsigned)
 Copied from CRM [id number]. Topic: Clinical - Medication Refill >> Dec 30, 2023  8:17 AM Franchot Heidelberg wrote: Pt's wife Arline Asp called reporting that this was never sent to the pharmacy although it was approved by PCP. Please submit to the pharmacy listed below. Pt has expired supplies currently so he is completely out.         Most Recent Primary Care Visit:  Provider: Bernadette Hoit  Department: [facility]  Visit Type: ACUTE  Date: 11/18/2023  Medication: ***  Has the patient contacted their pharmacy? {yes/no:20286} (Agent: If no, request that the patient contact the pharmacy for the refill. If patient does not wish to contact the pharmacy document the reason why and proceed with request.) (Agent: If yes, when and what did the pharmacy advise?)  Is this the correct pharmacy for this prescription? {yes/no:20286} If no, delete pharmacy and type the correct one.  This is the patient's preferred pharmacy:  [facility] - [place], [place] - [address] [place] [address] Phone: [phone] Fax: [phone]   Has the prescription been filled recently? {yes/no:20286}  Is the patient out of the medication? {yes/no:20286}  Has the patient been seen for an appointment in the last year OR does the patient have an upcoming appointment? {yes/no:20286}  Can we respond through MyChart? {yes/no:20286}  Agent: Please be advised that Rx refills may take up to 3 business days. We ask that you follow-up with your pharmacy.

## 2024-01-02 ENCOUNTER — Other Ambulatory Visit: Payer: Self-pay

## 2024-01-02 DIAGNOSIS — E119 Type 2 diabetes mellitus without complications: Secondary | ICD-10-CM

## 2024-01-02 MED ORDER — BLOOD GLUCOSE MONITOR KIT
PACK | 0 refills | Status: DC
Start: 2024-01-02 — End: 2024-01-03

## 2024-01-03 ENCOUNTER — Telehealth: Payer: Self-pay

## 2024-01-03 ENCOUNTER — Other Ambulatory Visit: Payer: Self-pay

## 2024-01-03 DIAGNOSIS — E119 Type 2 diabetes mellitus without complications: Secondary | ICD-10-CM

## 2024-01-03 MED ORDER — BLOOD GLUCOSE MONITOR KIT: PACK | 0 refills | Status: DC

## 2024-01-03 MED ORDER — BLOOD GLUCOSE MONITOR KIT: PACK | 0 refills | Status: AC

## 2024-01-03 NOTE — Telephone Encounter (Signed)
 Patient wife calling back concerning the issue with prescription for a glucose monitor . Called [facility] and spoke with Ms. Nanette and they sre going to get it sent it now and I relayed that information to the patient . And told her to check with the pharmacy in the next 30 minutes . And patient said ok . Hope everything is worked out now

## 2024-01-03 NOTE — Telephone Encounter (Signed)
 Pt needs this equipment: blood glucose meter kit and supplies KIT [[id number]]  resent to [facility], [place] - [address] [place], [place] [address] Phone: [phone]  Fax: [phone]  Please call pt's wife when this is sent in please at [phone]

## 2024-01-22 DIAGNOSIS — E113393 Type 2 diabetes mellitus with moderate nonproliferative diabetic retinopathy without macular edema, bilateral: Secondary | ICD-10-CM | POA: Diagnosis not present

## 2024-03-09 ENCOUNTER — Ambulatory Visit: Payer: Self-pay

## 2024-03-09 ENCOUNTER — Encounter: Payer: Self-pay | Admitting: Family Medicine

## 2024-03-09 ENCOUNTER — Ambulatory Visit (INDEPENDENT_AMBULATORY_CARE_PROVIDER_SITE_OTHER): Admitting: Family Medicine

## 2024-03-09 VITALS — BP 126/70 | HR 79 | Temp 97.9°F | Ht 69.0 in | Wt 205.4 lb

## 2024-03-09 DIAGNOSIS — R197 Diarrhea, unspecified: Secondary | ICD-10-CM | POA: Diagnosis not present

## 2024-03-09 NOTE — Telephone Encounter (Signed)
 Chief Complaint: diarrhea and vomiting  Symptoms: diarrhea   Disposition: [] ED /[] Urgent Care (no appt availability in office) / [x] Appointment(In office/virtual)/ []  [facility]/ [] Home Care/ [] Refused Recommended Disposition /[] [facility]/ []  Follow-up with PCP Additional Notes: Pt wife, Carmelina Chinchilla,  called with concerns of vomiting and diarrhea since Friday evening. Pt ate at a restaurant Friday lunch and had smoked [place], mac-n-cheese with bacon, and hush puppies and a few hours later he ate a hamburger. Pt was vomiting and diarrhea soon after. Vomiting lasted a day, but diarrhea has been constant. Carmelina Chinchilla stated the has gone to bathroom at least 10 times in 24 hours and says "it smells worse than normal." Pt's liquid intake has been 80-100 oz of electrolyte replacement drinks. Pt does have dry mouth but denies weakness standing/dizziness. Pt has appt with NP Gwen Lek today at 1445. RN gave care advice and Carmelina Chinchilla verbalized understanding.            Copied from CRM [id number]. Topic: Clinical - Red Word Triage >> Mar 09, 2024  8:27 AM Baldomero Bone wrote: Red Word that prompted transfer to Nurse Triage: Carmelina Chinchilla, wife, states that late Friday, patient starting throwing up and diarrhea. Throwing up has stopped but continual diarrhea. Barely any food since Friday. Callback number is [phone] Reason for Disposition  [1] SEVERE diarrhea (e.g., 7 or more times / day more than normal) AND [2] age > 60 years  Answer Assessment - Initial Assessment Questions 1. DIARRHEA SEVERITY: "How bad is the diarrhea?" "How many more stools have you had in the past 24 hours than normal?"    - NO DIARRHEA (SCALE 0)   - MILD (SCALE 1-3): Few loose or mushy BMs; increase of 1-3 stools over normal daily number of stools; mild increase in ostomy output.   -  MODERATE (SCALE 4-7): Increase of 4-6 stools daily over normal; moderate increase in ostomy output.   -  SEVERE (SCALE 8-10; OR "WORST  POSSIBLE"): Increase of 7 or more stools daily over normal; moderate increase in ostomy output; incontinence.     10 or more 2. ONSET: "When did the diarrhea begin?"      Friday 3. BM CONSISTENCY: "How loose or watery is the diarrhea?"      Watery  4. VOMITING: "Are you also vomiting?" If Yes, ask: "How many times in the past 24 hours?"      no 5. ABDOMEN PAIN: "Are you having any abdomen pain?" If Yes, ask: "What does it feel like?" (e.g., crampy, dull, intermittent, constant)      Sore from vomiting  6. ABDOMEN PAIN SEVERITY: If present, ask: "How bad is the pain?"  (e.g., Scale 1-10; mild, moderate, or severe)   - MILD (1-3): doesn't interfere with normal activities, abdomen soft and not tender to touch    - MODERATE (4-7): interferes with normal activities or awakens from sleep, abdomen tender to touch    - SEVERE (8-10): excruciating pain, doubled over, unable to do any normal activities       Ribs sore- no number give  7. ORAL INTAKE: If vomiting, "Have you been able to drink liquids?" "How much liquids have you had in the past 24 hours?"     80-100 oz 8. HYDRATION: "Any signs of dehydration?" (e.g., dry mouth [not just dry lips], too weak to stand, dizziness, new weight loss) "When did you last urinate?"     Dry mouth 9. EXPOSURE: "Have you traveled to a foreign country recently?" "  Have you been exposed to anyone with diarrhea?" "Could you have eaten any food that was spoiled?"     Denies  10. ANTIBIOTIC USE: "Are you taking antibiotics now or have you taken antibiotics in the past 2 months?"       Denies  11. OTHER SYMPTOMS: "Do you have any other symptoms?" (e.g., fever, blood in stool)       Denies  Protocols used: Diarrhea-A-AH

## 2024-03-09 NOTE — Progress Notes (Signed)
 Subjective:  HPI: Kenneth Brown is a 73 y.o. male presenting on 03/09/2024 for Diarrhea and Emesis   HPI Patient is in today for vomiting and diarrhea since Saturday. Vomiting has subsided but continues to have diarrhea. Described as having had 5-6 watery, foul smelling bowel movements today. Is able to tolerate PO intake and is pushing electrolyte fluids. No recent antibiotic use, new medications, fever, body aches, abdominal pain, hematochezia, or melena. No known sick contacts. Has tried Zofran .  Symptoms overall improving  Review of Systems  All other systems reviewed and are negative.   Relevant past medical history reviewed and updated as indicated.   Past Medical History:  Diagnosis Date   Acute cystitis    Arthritis    ankle   Cirrhosis of liver (HCC)    Diabetes mellitus without complication (HCC)    pre diabetic   Difficulty urinating    Gross hematuria    Platelets decreased (HCC)    Renal calculus, bilateral    UTI (urinary tract infection) 10/2014     Past Surgical History:  Procedure Laterality Date   ANKLE SURGERY  08/28/2012   COLONOSCOPY N/A 06/12/2021   Procedure: COLONOSCOPY with BIOPSY;  Surgeon: Marnee Sink, MD;  Location: [facility];  Service: Endoscopy;  Laterality: N/A;   COLONOSCOPY WITH PROPOFOL  N/A 07/08/2015   Procedure: COLONOSCOPY WITH PROPOFOL ;  Surgeon: Marnee Sink, MD;  Location: [facility];  Service: Endoscopy;  Laterality: N/A;   ESOPHAGOGASTRODUODENOSCOPY (EGD) WITH PROPOFOL  N/A 07/08/2015   Procedure: ESOPHAGOGASTRODUODENOSCOPY (EGD) WITH PROPOFOL ;  Surgeon: Marnee Sink, MD;  Location: [facility];  Service: Endoscopy;  Laterality: N/A;   FRACTURE SURGERY N/A    [employer]rees[employer] 01/30/2021   JOINT REPLACEMENT N/A    [employer]reesia 01/30/2021   ORIF ANKLE FRACTURE Right    plate and screws, pt still has   POLYPECTOMY  07/08/2015   Procedure: POLYPECTOMY;  Surgeon: Marnee Sink, MD;  Location: [facility];  Service: Endoscopy;;   POLYPECTOMY N/A 06/12/2021   Procedure: POLYPECTOMY;  Surgeon: Marnee Sink, MD;  Location: [facility];  Service: Endoscopy;  Laterality: N/A;   URETEROSCOPY WITH HOLMIUM LASER LITHOTRIPSY Right 03/21/2015   Procedure: URETEROSCOPY WITH HOLMIUM LASER LITHOTRIPSY,retrograde pyelogram,stent placement;  Surgeon: Michelina Aho, MD;  Location: [facility] ORS;  Service: Urology;  Laterality: Right;    Allergies and medications reviewed and updated.   Current Outpatient Medications:    B Complex-C-Biotin-D-Zinc-FA (VITAL-D RX PO), Take 50 mg by mouth daily., Disp: , Rfl:    benzonatate  (TESSALON  PERLES) 100 MG capsule, Take 1 capsule (100 mg total) by mouth 3 (three) times daily as needed for cough., Disp: 30 capsule, Rfl: 0   blood glucose meter kit and supplies KIT, Dispense based on patient and insurance preference. Use up to four times daily as directed., Disp: 1 each, Rfl: 0   empagliflozin  (JARDIANCE ) 25 MG TABS tablet, Take 1 tablet (25 mg total) by mouth daily before breakfast., Disp: 90 tablet, Rfl: 3   glucose blood (ONETOUCH ULTRA) test strip, Use to check blood sugar four times per day., Disp: 100 each, Rfl: 12   HYDROcodone  bit-homatropine (HYCODAN) 5-1.5 MG/5ML syrup, Take 5 mLs by mouth every 8 (eight) hours as needed for cough., Disp: 120 mL, Rfl: 0   Lancet Devices (ONE TOUCH DELICA LANCING DEV) MISC, Use to check blood sugars 4 times per day., Disp: 100 each, Rfl: 12   Lancets (ONETOUCH DELICA PLUS LANCET33G) MISC, Apply 1 each topically 4 (four)  times daily., Disp: , Rfl:    naproxen  (EC NAPROSYN ) 500 MG EC tablet, Take 1 tablet (500 mg total) by mouth 2 (two) times daily with a meal., Disp: 30 tablet, Rfl: 0   baclofen (LIORESAL) 10 MG tablet, Take 10 mg by mouth 3 (three) times daily. (Patient not taking: Reported on 05/07/2023), Disp: , Rfl:   No Known Allergies  Objective:   BP 126/70   Pulse 79   Temp 97.9 F (36.6 C) (Oral)   Ht 5\' 9"   (1.753 m)   Wt 205 lb 6 oz (93.2 kg)   SpO2 97%   BMI 30.33 kg/m      03/09/2024    2:45 PM 11/18/2023    3:51 PM 07/11/2023    4:16 PM  Vitals with BMI  Height 5\' 9"  5\' 9"  5\' 9"   Weight 205 lbs 6 oz 211 lbs 6 oz 210 lbs  BMI 30.31 31.2 31  Systolic 126 122 161  Diastolic 70 76 80  Pulse 79 86 81     Physical Exam Vitals and nursing note reviewed.  Constitutional:      Appearance: Normal appearance. He is normal weight.  HENT:     Head: Normocephalic and atraumatic.  Cardiovascular:     Rate and Rhythm: Normal rate and regular rhythm.     Pulses: Normal pulses.     Heart sounds: Normal heart sounds.  Pulmonary:     Effort: Pulmonary effort is normal.     Breath sounds: Normal breath sounds.  Abdominal:     General: Bowel sounds are normal.     Palpations: Abdomen is soft.     Tenderness: There is no abdominal tenderness.     Hernia: A hernia is present.  Skin:    General: Skin is warm and dry.     Capillary Refill: Capillary refill takes less than 2 seconds.  Neurological:     General: No focal deficit present.     Mental Status: He is alert and oriented to person, place, and time. Mental status is at baseline.  Psychiatric:        Mood and Affect: Mood normal.        Behavior: Behavior normal.        Thought Content: Thought content normal.        Judgment: Judgment normal.     Assessment & Plan:  Diarrhea, unspecified type Assessment & Plan: CMP today, will obtain stool O&P, c.diff, and pathogen panel. Encouraged to continue pushing electrolyte fluids and PO intake as tolerated. Seek medical care if symptoms persist or worsen.  Orders: -     Comprehensive metabolic panel with GFR -     Gastrointestinal Pathogen Pnl RT, PCR -     Clostridium difficile culture-fecal -     Ova and parasite examination     Follow up plan: Return if symptoms worsen or fail to improve.  Jenelle Mis, FNP

## 2024-03-09 NOTE — Assessment & Plan Note (Signed)
 CMP today, will obtain stool O&P, c.diff, and pathogen panel. Encouraged to continue pushing electrolyte fluids and PO intake as tolerated. Seek medical care if symptoms persist or worsen.

## 2024-03-10 DIAGNOSIS — R197 Diarrhea, unspecified: Secondary | ICD-10-CM | POA: Diagnosis not present

## 2024-03-10 LAB — COMPREHENSIVE METABOLIC PANEL WITH GFR
AG Ratio: 1.6 (calc) (ref 1.0–2.5)
ALT: 27 U/L (ref 9–46)
AST: 40 U/L — ABNORMAL HIGH (ref 10–35)
Albumin: 3.9 g/dL (ref 3.6–5.1)
Alkaline phosphatase (APISO): 74 U/L (ref 35–144)
BUN: 15 mg/dL (ref 7–25)
CO2: 25 mmol/L (ref 20–32)
Calcium: 8.7 mg/dL (ref 8.6–10.3)
Chloride: 104 mmol/L (ref 98–110)
Creat: 1.04 mg/dL (ref 0.70–1.28)
Globulin: 2.4 g/dL (ref 1.9–3.7)
Glucose, Bld: 101 mg/dL — ABNORMAL HIGH (ref 65–99)
Potassium: 4.1 mmol/L (ref 3.5–5.3)
Sodium: 138 mmol/L (ref 135–146)
Total Bilirubin: 1.5 mg/dL — ABNORMAL HIGH (ref 0.2–1.2)
Total Protein: 6.3 g/dL (ref 6.1–8.1)
eGFR: 75 mL/min/{1.73_m2} (ref >=60)

## 2024-03-14 LAB — GASTROINTESTINAL PATHOGEN PNL
CampyloBacter Group: NOT DETECTED
Norovirus GI/GII: DETECTED — AB
Rotavirus A: NOT DETECTED
Salmonella species: NOT DETECTED
Shiga Toxin 1: NOT DETECTED
Shiga Toxin 2: NOT DETECTED
Shigella Species: NOT DETECTED
Vibrio Group: NOT DETECTED
Yersinia enterocolitica: NOT DETECTED

## 2024-03-14 LAB — OVA AND PARASITE EXAMINATION
CONCENTRATE RESULT:: NONE SEEN
MICRO NUMBER:: 16448666
SPECIMEN QUALITY:: ADEQUATE
TRICHROME RESULT:: NONE SEEN

## 2024-03-15 LAB — CLOSTRIDIUM DIFFICILE CULTURE-FECAL

## 2024-03-16 ENCOUNTER — Ambulatory Visit: Payer: Self-pay | Admitting: Family Medicine

## 2024-03-16 ENCOUNTER — Telehealth: Payer: Self-pay

## 2024-03-16 NOTE — Telephone Encounter (Signed)
 Pt call wanting to see Dr. Ole Berkeley due to spouse having a  hernia.  I explain that Dr. Ole Berkeley  was no longer doing O.V.  I gave pt [facility] number and explained that Dr. Baldomero Bone and Antony Baumgartner would be seeing pt there starting June .1st , I explain pt need to see MD before procedure could be done.

## 2024-03-18 ENCOUNTER — Telehealth: Payer: Self-pay

## 2024-03-18 NOTE — Telephone Encounter (Signed)
 Left message on voicemail  GI does not handle hernia, pt will need to see general surgery

## 2024-03-18 NOTE — Telephone Encounter (Signed)
 Copied from CRM [id number]. Topic: Referral - Question >> Mar 18, 2024  2:39 PM Stanly Early wrote: Reason for CRM: wife is calling about a referral for her husband for a hernia. Notes stated it would be general surgery. Please give her a call  [phone]

## 2024-03-18 NOTE — Telephone Encounter (Signed)
 Spouse wanted guidance regarding pt's hernia

## 2024-03-30 ENCOUNTER — Ambulatory Visit: Payer: Self-pay | Admitting: Surgery

## 2024-03-30 DIAGNOSIS — M6208 Separation of muscle (nontraumatic), other site: Secondary | ICD-10-CM | POA: Diagnosis not present

## 2024-03-30 DIAGNOSIS — K429 Umbilical hernia without obstruction or gangrene: Secondary | ICD-10-CM | POA: Diagnosis not present

## 2024-03-30 NOTE — H&P (Signed)
 Subjective    Chief Complaint: Umbilical Hernia       History of Present Illness: Kenneth Brown is a 73 y.o. male who is seen today as an office consultation at the request of Dr. Cheril Cork for evaluation of Umbilical Hernia .     This is a 73 year old male who presents with several years of a slight protrusion at his umbilicus.  This has become larger and is beginning to cause some discomfort.  Over the last couple years, he has also noticed some bulging in the upper abdomen when he is trying to sit up.  The patient denies any obstructive symptoms.  Appetite and digestion seem to be normal.  The patient is now being seen for this umbilical hernia as well as the rectus diastases.   A couple of years ago, the patient had some mildly elevated liver function tests.  Imaging was consistent with cirrhosis.  However, the patient has not required any treatment.     Review of Systems: A complete review of systems was obtained from the patient.  I have reviewed this information and discussed as appropriate with the patient.  See HPI as well for other ROS.   Review of Systems  Constitutional: Negative.   HENT: Negative.    Eyes: Negative.   Respiratory: Negative.    Cardiovascular: Negative.   Gastrointestinal:  Positive for abdominal pain.  Genitourinary: Negative.   Musculoskeletal: Negative.   Skin: Negative.   Neurological: Negative.   Endo/Heme/Allergies: Negative.   Psychiatric/Behavioral: Negative.          Medical History: Past Medical History      Past Medical History:  Diagnosis Date   Arthritis     Diabetes mellitus without complication (CMS/HHS-HCC)     Hyperlipidemia          Problem List     Patient Active Problem List  Diagnosis   Abnormal liver enzymes   Adiposity   Allergic rhinitis   Arthritis   B12 deficiency   Borderline diabetes   Thrombocytopenia ()   History of colon polyps   Presence of right artificial ankle joint   Lumbar radiculopathy   HLD  (hyperlipidemia)   Primary localized osteoarthrosis of ankle and foot        Past Surgical History       Past Surgical History:  Procedure Laterality Date   CATARACT EXTRACTION       JOINT REPLACEMENT            Allergies  No Known Allergies     Medications Ordered Prior to Encounter        Current Outpatient Medications on File Prior to Visit  Medication Sig Dispense Refill   acetaminophen  (TYLENOL ) 500 MG tablet Take by mouth every 4 (four) hours as needed for Pain       baclofen (LIORESAL) 10 MG tablet Take 10 mg by mouth 3 (three) times daily       benzonatate  (TESSALON ) 100 MG capsule Take 100 mg by mouth 3 (three) times daily as needed       blood glucose diagnostic (ONETOUCH ULTRA TEST) test strip Use as instructed.       cyanocobalamin  (VITAMIN B12) 1,000 mcg/mL injection Inject into the muscle monthly       HYDROcodone -acetaminophen  (NORCO) 5-325 mg tablet Take one tablet at night for pain; may take up to every 6 hours as needed for pain if not working or driving 20 tablet 0   HYDROcodone -homatropine (HYCODAN) 5-1.5 mg/5 mL syrup  Take 5 mLs by mouth       JARDIANCE  25 mg tablet         lancets Use       naproxen  (EC NAPROSYN ) 500 MG EC tablet Take 500 mg by mouth 2 (two) times daily with meals       ONETOUCH DELICA PLUS LANCET         ONETOUCH ULTRA TEST test strip          No current facility-administered medications on file prior to visit.        Family History       Family History  Problem Relation Age of Onset   Coronary Artery Disease (Blocked arteries around heart) Father     Coronary Artery Disease (Blocked arteries around heart) Brother          Tobacco Use History  Social History       Tobacco Use  Smoking Status Never  Smokeless Tobacco Never        Social History  Social History        Socioeconomic History   Marital status: Unknown  Tobacco Use   Smoking status: Never   Smokeless tobacco: Never  Vaping Use   Vaping status: Never  Used  Substance and Sexual Activity   Alcohol use: Not Currently   Drug use: Never   Sexual activity: Defer    Social Drivers of Health        Financial Resource Strain: Low Risk  (05/07/2023)    Received from [facility]    Overall Financial Resource Strain (CARDIA)     Difficulty of Paying Living Expenses: Not hard at all  Food Insecurity: No Food Insecurity (05/07/2023)    Received from [facility]    Hunger Vital Sign     Worried About Running Out of Food in the Last Year: Never true     Ran Out of Food in the Last Year: Never true  Transportation Needs: No Transportation Needs (05/07/2023)    Received from [facility] - Transportation     Lack of Transportation (Medical): No     Lack of Transportation (Non-Medical): No  Physical Activity: Sufficiently Active (05/07/2023)    Received from [facility]    Exercise Vital Sign     Days of Exercise per Week: 5 days     Minutes of Exercise per Session: 30 min  Stress: No Stress Concern Present (05/07/2023)    Received from [facility] - Occupational Stress Questionnaire     Feeling of Stress : Not at all  Social Connections: Moderately Integrated (05/07/2023)    Received from [facility]    Social Connection and Isolation Panel [NHANES]     Frequency of Communication with Friends and Family: More than three times a week     Frequency of Social Gatherings with Friends and Family: More than three times a week     Attends Religious Services: More than 4 times per year     Active Member of Clubs or Organizations: No     Attends Banker Meetings: Never     Marital Status: Married  Housing Stability: Unknown (03/30/2024)    Housing Stability Vital Sign     Homeless in the Last Year: No        Objective:         Vitals:    03/30/24 1022  PainSc: 0-No pain  There is no height or weight on file to calculate BMI.   Physical Exam    Constitutional:  WDWN in NAD,  conversant, no obvious deformities; lying in bed comfortably Eyes:  Pupils equal, round; sclera anicteric; moist conjunctiva; no lid lag HENT:  Oral mucosa moist; good dentition  Neck:  No masses palpated, trachea midline; no thyromegaly Lungs:  CTA bilaterally; normal respiratory effort CV:  Regular rate and rhythm; no murmurs; extremities well-perfused with no edema Abd:  +bowel sounds, soft, non-tender, no palpable organomegaly; large upper midline rectus diastases when the patient is transitioning from supine to sitting position.  No palpable hernia defects.  The patient has a reducible umbilical hernia.  The hernia bulge is approximately 3 cm but the fascial defect is approximately 1.5 cm. Musc: Normal gait; no apparent clubbing or cyanosis in extremities Lymphatic:  No palpable cervical or axillary lymphadenopathy Skin:  Warm, dry; no sign of jaundice Psychiatric - alert and oriented x 4; calm mood and affect       Assessment and Plan:  Diagnoses and all orders for this visit:   Umbilical hernia without obstruction or gangrene   Rectus diastasis     I reassured the patient and his wife that he does not need to have surgery to repair the rectus diastases.  Recommend umbilical hernia repair with mesh.The surgical procedure has been discussed with the patient.  Potential risks, benefits, alternative treatments, and expected outcomes have been explained.  All of the patient's questions at this time have been answered.  The likelihood of reaching the patient's treatment goal is good.  The patient understands the proposed surgical procedure and wishes to proceed.     Kenneth Snead Jannelle Memory, MD  03/30/2024 10:42 AM

## 2024-04-07 ENCOUNTER — Ambulatory Visit (INDEPENDENT_AMBULATORY_CARE_PROVIDER_SITE_OTHER): Admitting: Family Medicine

## 2024-04-07 ENCOUNTER — Encounter: Payer: Self-pay | Admitting: Family Medicine

## 2024-04-07 VITALS — BP 139/78 | HR 70 | Ht 69.0 in | Wt 205.0 lb

## 2024-04-07 DIAGNOSIS — E119 Type 2 diabetes mellitus without complications: Secondary | ICD-10-CM

## 2024-04-07 DIAGNOSIS — K760 Fatty (change of) liver, not elsewhere classified: Secondary | ICD-10-CM | POA: Diagnosis not present

## 2024-04-07 DIAGNOSIS — Z125 Encounter for screening for malignant neoplasm of prostate: Secondary | ICD-10-CM

## 2024-04-07 DIAGNOSIS — K7460 Unspecified cirrhosis of liver: Secondary | ICD-10-CM

## 2024-04-07 DIAGNOSIS — Z7984 Long term (current) use of oral hypoglycemic drugs: Secondary | ICD-10-CM

## 2024-04-07 NOTE — Progress Notes (Signed)
 Subjective:    Patient ID: Kenneth Brown, male    DOB: [birth year], 73 y.o.   MRN: [id number]  Patient has a history of type 2 diabetes mellitus.  He is currently on Jardiance  25 mg daily.  His fasting blood sugar have been benign 110 consistently.  Denies any polyuria, polydipsia, blurry vision.  He has a history of cirrhosis of liver presumed due to fatty liver disease.  Recent lab work in the office did show a mild elevation of his ALT and also mild elevation of his bilirubin.  In 2022 he weighed 231 pounds.  He is down to 206 pounds now however his weight loss is stalled.  He has not lost any additional weight in 1 year.  Past Medical History:  Diagnosis Date   Acute cystitis    Arthritis    ankle   Cirrhosis of liver (HCC)    Diabetes mellitus without complication (HCC)    pre diabetic   Difficulty urinating    Gross hematuria    Platelets decreased (HCC)    Renal calculus, bilateral    UTI (urinary tract infection) 10/2014   Past Surgical History:  Procedure Laterality Date   ANKLE SURGERY  08/28/2012   COLONOSCOPY N/A 06/12/2021   Procedure: COLONOSCOPY with BIOPSY;  Surgeon: Marnee Sink, MD;  Location: [facility];  Service: Endoscopy;  Laterality: N/A;   COLONOSCOPY WITH PROPOFOL  N/A 07/08/2015   Procedure: COLONOSCOPY WITH PROPOFOL ;  Surgeon: Marnee Sink, MD;  Location: [facility];  Service: Endoscopy;  Laterality: N/A;   ESOPHAGOGASTRODUODENOSCOPY (EGD) WITH PROPOFOL  N/A 07/08/2015   Procedure: ESOPHAGOGASTRODUODENOSCOPY (EGD) WITH PROPOFOL ;  Surgeon: Marnee Sink, MD;  Location: [facility];  Service: Endoscopy;  Laterality: N/A;   FRACTURE SURGERY N/A    Phreesia 01/30/2021   JOINT REPLACEMENT N/A    Phreesia 01/30/2021   ORIF ANKLE FRACTURE Right    plate and screws, pt still has   POLYPECTOMY  07/08/2015   Procedure: POLYPECTOMY;  Surgeon: Marnee Sink, MD;  Location: [facility];  Service: Endoscopy;;   POLYPECTOMY N/A  06/12/2021   Procedure: POLYPECTOMY;  Surgeon: Marnee Sink, MD;  Location: [facility];  Service: Endoscopy;  Laterality: N/A;   URETEROSCOPY WITH HOLMIUM LASER LITHOTRIPSY Right 03/21/2015   Procedure: URETEROSCOPY WITH HOLMIUM LASER LITHOTRIPSY,retrograde pyelogram,stent placement;  Surgeon: Michelina Aho, MD;  Location: [facility] OR[facility];  Service: Urology;  Laterality: Right;   Current Outpatient Medications on File Prior to Visit  Medication Sig Dispense Refill   blood glucose meter kit and supplies KIT Dispense based on patient and insurance preference. Use up to four times daily as directed. 1 each 0   empagliflozin  (JARDIANCE ) 25 MG TABS tablet Take 1 tablet (25 mg total) by mouth daily before breakfast. 90 tablet 3   glucose blood (ONETOUCH ULTRA) test strip Use to check blood sugar four times per day. 100 each 12   Lancet Devices (ONE TOUCH DELICA LANCING DEV) MISC Use to check blood sugars 4 times per day. 100 each 12   Lancets (ONETOUCH DELICA PLUS LANCET33G) MISC Apply 1 each topically 4 (four) times daily.     vitamin B-12 (CYANOCOBALAMIN ) 100 MCG tablet Take 100 mcg by mouth daily.     No current facility-administered medications on file prior to visit.   Aaron Aasall Social History   Socioeconomic History   Marital status: Married    Spouse name: Not on file   Number of children: 1   Years of education: Not on  file   Highest education level: Some college, no degree  Occupational History   Occupation: part time Holiday representative  Tobacco Use   Smoking status: Never   Smokeless tobacco: Never  Vaping Use   Vaping status: Never Used  Substance and Sexual Activity   Alcohol use: Yes    Alcohol/week: 0.0 - 1.0 standard drinks of alcohol    Comment: 1-2 drinks a month   Drug use: No   Sexual activity: Yes  Other Topics Concern   Not on file  Social History Narrative   Not on file   Social Drivers of Health   Financial Resource Strain: Low Risk  (05/07/2023)   Overall  Financial Resource Strain (CARDIA)    Difficulty of Paying Living Expenses: Not hard at all  Food Insecurity: No Food Insecurity (05/07/2023)   Hunger Vital Sign    Worried About Running Out of Food in the Last Year: Never true    Ran Out of Food in the Last Year: Never true  Transportation Needs: No Transportation Needs (05/07/2023)   PRAPARE - Administrator, Civil Service (Medical): No    Lack of Transportation (Non-Medical): No  Physical Activity: Sufficiently Active (05/07/2023)   Exercise Vital Sign    Days of Exercise per Week: 5 days    Minutes of Exercise per Session: 30 min  Stress: No Stress Concern Present (05/07/2023)   [employer] of Occupational Health - Occupational Stress Questionnaire    Feeling of Stress : Not at all  Social Connections: Moderately Integrated (05/07/2023)   Social Connection and Isolation Panel [NHANES]    Frequency of Communication with Friends and Family: More than three times a week    Frequency of Social Gatherings with Friends and Family: More than three times a week    Attends Religious Services: More than 4 times per year    Active Member of [employer] or Organizations: No    Attends Banker Meetings: Never    Marital Status: Married  Catering manager Violence: Not At Risk (05/07/2023)   Humiliation, Afraid, Rape, and Kick questionnaire    Fear of Current or Ex-Partner: No    Emotionally Abused: No    Physically Abused: No    Sexually Abused: No   Family History  Problem Relation Age of Onset   Hypertension Mother    Heart disease Mother    Stroke Mother    COPD Mother    Heart disease Father    Heart attack Father    Lung cancer Father    Skin cancer Father      Review of Systems  Gastrointestinal:  Positive for abdominal pain.  All other systems reviewed and are negative.      Objective:   Physical Exam Vitals reviewed.  Constitutional:      General: He is not in acute distress.    Appearance: Normal  appearance. He is obese. He is not ill-appearing, toxic-appearing or diaphoretic.  HENT:     Head: Normocephalic and atraumatic.     Right Ear: Tympanic membrane and ear canal normal.     Left Ear: Tympanic membrane and ear canal normal.     Mouth/Throat:     Mouth: Mucous membranes are moist.     Pharynx: Oropharynx is clear. No oropharyngeal exudate or posterior oropharyngeal erythema.  Eyes:     Extraocular Movements: Extraocular movements intact.     Conjunctiva/sclera: Conjunctivae normal.     Pupils: Pupils are equal, round, and reactive to  light.  Neck:     Vascular: No carotid bruit.  Cardiovascular:     Rate and Rhythm: Normal rate and regular rhythm.     Pulses: Normal pulses.     Heart sounds: Normal heart sounds. No murmur heard.    No friction rub. No gallop.  Pulmonary:     Effort: Pulmonary effort is normal. No respiratory distress.     Breath sounds: Normal air entry. Examination of the left-lower field reveals rales. Rales present. No wheezing or rhonchi.    Chest:    Abdominal:     General: There is distension.     Palpations: Abdomen is soft.     Tenderness: There is no abdominal tenderness. There is no guarding.     Hernia: A hernia is present.  Musculoskeletal:        General: No swelling.     Cervical back: Normal range of motion and neck supple. No tenderness.     Right lower leg: No edema.     Left lower leg: No edema.  Lymphadenopathy:     Cervical: No cervical adenopathy.  Skin:    Coloration: Skin is not jaundiced or pale.     Findings: No bruising, erythema, lesion or rash.  Neurological:     General: No focal deficit present.     Mental Status: He is alert and oriented to person, place, and time. Mental status is at baseline.     Cranial Nerves: No cranial nerve deficit.     Sensory: No sensory deficit.     Motor: No weakness.     Coordination: Coordination normal.     Gait: Gait normal.  Psychiatric:        Mood and Affect: Mood normal.         Behavior: Behavior normal.        Thought Content: Thought content normal.        Judgment: Judgment normal.           Assessment & Plan:  Type 2 diabetes mellitus without complication, without long-term current use of insulin (HCC) - Plan: Hemoglobin A1c, CBC with Differential/Platelet, Comprehensive metabolic panel with GFR, Lipid panel, Microalbumin/Creatinine Ratio, Urine  Prostate cancer screening - Plan: PSA  Cirrhosis of liver without ascites, unspecified hepatic cirrhosis type (HCC)  NAFLD (nonalcoholic fatty liver disease) Patient has metabolic function of nonalcoholic liver disease.  He would benefit from switching from Jardiance  to Mounjaro or Ozempic.  However he is currently receiving the Jardiance  for free and his family is hesitant to change to Mounjaro due to fear of cost.  They will check on the cost of the medication and let me know if a change supply meanwhile I would like him to return fasting for CBC a CMP a lipid panel a urine protein creatinine ratio along with a hemoglobin A1c.  I would like to see his A1c less than 6.5, his LDL cholesterol less than 409.  Check a PSA to screen for prostate cancer.

## 2024-04-10 ENCOUNTER — Other Ambulatory Visit

## 2024-04-10 DIAGNOSIS — Z125 Encounter for screening for malignant neoplasm of prostate: Secondary | ICD-10-CM | POA: Diagnosis not present

## 2024-04-10 DIAGNOSIS — E119 Type 2 diabetes mellitus without complications: Secondary | ICD-10-CM | POA: Diagnosis not present

## 2024-04-11 LAB — COMPREHENSIVE METABOLIC PANEL WITH GFR
AG Ratio: 1.8 (calc) (ref 1.0–2.5)
ALT: 29 U/L (ref 9–46)
AST: 35 U/L (ref 10–35)
Albumin: 4.2 g/dL (ref 3.6–5.1)
Alkaline phosphatase (APISO): 76 U/L (ref 35–144)
BUN: 13 mg/dL (ref 7–25)
CO2: 26 mmol/L (ref 20–32)
Calcium: 9.1 mg/dL (ref 8.6–10.3)
Chloride: 105 mmol/L (ref 98–110)
Creat: 1 mg/dL (ref 0.70–1.28)
Globulin: 2.4 g/dL (ref 1.9–3.7)
Glucose, Bld: 110 mg/dL — ABNORMAL HIGH (ref 65–99)
Potassium: 4 mmol/L (ref 3.5–5.3)
Sodium: 138 mmol/L (ref 135–146)
Total Bilirubin: 0.9 mg/dL (ref 0.2–1.2)
Total Protein: 6.6 g/dL (ref 6.1–8.1)
eGFR: 79 mL/min/{1.73_m2} (ref >=60)

## 2024-04-11 LAB — CBC WITH DIFFERENTIAL/PLATELET
Absolute Lymphocytes: 628 {cells}/uL — ABNORMAL LOW (ref 850–3900)
Absolute Monocytes: 264 {cells}/uL (ref 200–950)
Basophils Absolute: 12 {cells}/uL (ref 0–200)
Basophils Relative: 0.3 %
Eosinophils Absolute: 112 {cells}/uL (ref 15–500)
Eosinophils Relative: 2.8 %
HCT: 47.7 % (ref 38.5–50.0)
Hemoglobin: 15.6 g/dL (ref 13.2–17.1)
MCH: 29.8 pg (ref 27.0–33.0)
MCHC: 32.7 g/dL (ref 32.0–36.0)
MCV: 91.2 fL (ref 80.0–100.0)
MPV: 10 fL (ref 7.5–12.5)
Monocytes Relative: 6.6 %
Neutro Abs: 2984 {cells}/uL (ref 1500–7800)
Neutrophils Relative %: 74.6 %
Platelets: 52 10*3/uL — ABNORMAL LOW (ref 140–400)
RBC: 5.23 10*6/uL (ref 4.20–5.80)
RDW: 15.2 % — ABNORMAL HIGH (ref 11.0–15.0)
Total Lymphocyte: 15.7 %
WBC: 4 10*3/uL (ref 3.8–10.8)

## 2024-04-11 LAB — LIPID PANEL
Cholesterol: 145 mg/dL (ref <200)
HDL: 44 mg/dL (ref >=40)
LDL Cholesterol (Calc): 78 mg/dL
Non-HDL Cholesterol (Calc): 101 mg/dL (ref <130)
Total CHOL/HDL Ratio: 3.3 (calc) (ref <5.0)
Triglycerides: 125 mg/dL (ref <150)

## 2024-04-11 LAB — HEMOGLOBIN A1C
Hgb A1c MFr Bld: 5.8 % — ABNORMAL HIGH (ref <5.7)
Mean Plasma Glucose: 120 mg/dL
eAG (mmol/L): 6.6 mmol/L

## 2024-04-11 LAB — MICROALBUMIN / CREATININE URINE RATIO
Creatinine, Urine: 89 mg/dL (ref 20–320)
Microalb Creat Ratio: 11 mg/g{creat} (ref <30)
Microalb, Ur: 1 mg/dL

## 2024-04-11 LAB — PSA: PSA: 0.42 ng/mL (ref <=4.00)

## 2024-04-13 ENCOUNTER — Ambulatory Visit: Payer: Self-pay | Admitting: Family Medicine

## 2024-05-13 ENCOUNTER — Ambulatory Visit

## 2024-05-13 VITALS — Ht 69.0 in | Wt 205.0 lb

## 2024-05-13 DIAGNOSIS — Z0000 Encounter for general adult medical examination without abnormal findings: Secondary | ICD-10-CM

## 2024-05-13 NOTE — Progress Notes (Signed)
 Subjective:   Kenneth Brown is a 72 y.o. who presents for a Medicare Wellness preventive visit.  As a reminder, Annual Wellness Visits don't include a physical exam, and some assessments may be limited, especially if this visit is performed virtually. We may recommend an in-person follow-up visit with your provider if needed.  Visit Complete: Virtual I connected with  Kenneth Brown on 05/13/24 by a audio enabled telemedicine application and verified that I am speaking with the correct person using two identifiers.  Patient Location: Home  Provider Location: Home Office  I discussed the limitations of evaluation and management by telemedicine. The patient expressed understanding and agreed to proceed.  Vital Signs: Because this visit was a virtual/telehealth visit, some criteria may be missing or patient reported. Any vitals not documented were not able to be obtained and vitals that have been documented are patient reported.  VideoDeclined- This patient declined Librarian, academic. Therefore the visit was completed with audio only.  Persons Participating in Visit: Patient.  AWV Questionnaire: No: Patient Medicare AWV questionnaire was not completed prior to this visit.  Cardiac Risk Factors include: advanced age (>72men, >16 women);diabetes mellitus;male gender;hypertension     Objective:    Today's Vitals   05/13/24 0955  Weight: 205 lb (93 kg)  Height: 5' 9 (1.753 m)   Body mass index is 30.27 kg/m.     05/13/2024   10:06 AM 03/22/2022    9:56 AM 06/12/2021    9:23 AM 01/08/2018    8:51 AM 02/19/2017    1:13 PM 06/14/2016   10:00 AM 01/24/2016   10:23 AM  Advanced Directives  Does Patient Have a Medical Advance Directive? No Yes Yes Yes  Yes  Yes  Yes   Type of Furniture conservator/restorer;Living will Healthcare Power of [place];Living will Healthcare Power of [place];Living will Living will Healthcare Power  of [place];Living will  Healthcare Power of [place];Living will   Does patient want to make changes to medical advance directive?   No - Patient declined  --     Copy of Healthcare Power of Attorney in Chart?  Yes - validated most recent copy scanned in chart (See row information) Yes - validated most recent copy scanned in chart (See row information) No - copy requested      Would patient like information on creating a medical advance directive? Yes (MAU/Ambulatory/Procedural Areas - Information given)           Data saved with a previous flowsheet row definition    Current Medications (verified) Outpatient Encounter Medications as of 05/13/2024  Medication Sig   blood glucose meter kit and supplies KIT Dispense based on patient and insurance preference. Use up to four times daily as directed.   empagliflozin  (JARDIANCE ) 25 MG TABS tablet Take 1 tablet (25 mg total) by mouth daily before breakfast.   glucose blood (ONETOUCH ULTRA) test strip Use to check blood sugar four times per day.   Lancet Devices (ONE TOUCH DELICA LANCING DEV) MISC Use to check blood sugars 4 times per day.   Lancets (ONETOUCH DELICA PLUS LANCET33G) MISC Apply 1 each topically 4 (four) times daily.   vitamin B-12 (CYANOCOBALAMIN ) 100 MCG tablet Take 100 mcg by mouth daily.   No facility-administered encounter medications on file as of 05/13/2024.    Allergies (verified) Patient has no known allergies.   History: Past Medical History:  Diagnosis Date   Acute cystitis    Arthritis  ankle   Cirrhosis of liver (HCC)    Diabetes mellitus without complication (HCC)    pre diabetic   Difficulty urinating    Gross hematuria    Platelets decreased (HCC)    Renal calculus, bilateral    UTI (urinary tract infection) 10/2014   Past Surgical History:  Procedure Laterality Date   ANKLE SURGERY  08/28/2012   COLONOSCOPY N/A 06/12/2021   Procedure: COLONOSCOPY with BIOPSY;  Surgeon: Jinny Carmine, MD;  Location:  [facility];  Service: Endoscopy;  Laterality: N/A;   COLONOSCOPY WITH PROPOFOL  N/A 07/08/2015   Procedure: COLONOSCOPY WITH PROPOFOL ;  Surgeon: Carmine Jinny, MD;  Location: [facility];  Service: Endoscopy;  Laterality: N/A;   ESOPHAGOGASTRODUODENOSCOPY (EGD) WITH PROPOFOL  N/A 07/08/2015   Procedure: ESOPHAGOGASTRODUODENOSCOPY (EGD) WITH PROPOFOL ;  Surgeon: Carmine Jinny, MD;  Location: [facility];  Service: Endoscopy;  Laterality: N/A;   FRACTURE SURGERY N/A    Phreesia 01/30/2021   JOINT REPLACEMENT N/A    Phreesia 01/30/2021   ORIF ANKLE FRACTURE Right    plate and screws, pt still has   POLYPECTOMY  07/08/2015   Procedure: POLYPECTOMY;  Surgeon: Carmine Jinny, MD;  Location: [facility];  Service: Endoscopy;;   POLYPECTOMY N/A 06/12/2021   Procedure: POLYPECTOMY;  Surgeon: Jinny Carmine, MD;  Location: [facility];  Service: Endoscopy;  Laterality: N/A;   URETEROSCOPY WITH HOLMIUM LASER LITHOTRIPSY Right 03/21/2015   Procedure: URETEROSCOPY WITH HOLMIUM LASER LITHOTRIPSY,retrograde pyelogram,stent placement;  Surgeon: Charlie JONETTA Pack, MD;  Location: [facility] OR[facility];  Service: Urology;  Laterality: Right;   Family History  Problem Relation Age of Onset   Hypertension Mother    Heart disease Mother    Stroke Mother    COPD Mother    Heart disease Father    Heart attack Father    Lung cancer Father    Skin cancer Father    Diabetes Maternal Grandfather    Social History   Socioeconomic History   Marital status: Married    Spouse name: Not on file   Number of children: 1   Years of education: Not on file   Highest education level: Some college, no degree  Occupational History   Occupation: part time Holiday representative  Tobacco Use   Smoking status: Never   Smokeless tobacco: Never  Vaping Use   Vaping status: Never Used  Substance and Sexual Activity   Alcohol use: Yes    Alcohol/week: 0.0 - 1.0 standard drinks of alcohol    Comment: 1-2 drinks a  month   Drug use: No   Sexual activity: Yes  Other Topics Concern   Not on file  Social History Narrative   Not on file   Social Drivers of Health   Financial Resource Strain: Low Risk  (05/13/2024)   Overall Financial Resource Strain (CARDIA)    Difficulty of Paying Living Expenses: Not hard at all  Food Insecurity: No Food Insecurity (05/13/2024)   Hunger Vital Sign    Worried About Running Out of Food in the Last Year: Never true    Ran Out of Food in the Last Year: Never true  Transportation Needs: No Transportation Needs (05/13/2024)   PRAPARE - Administrator, Civil Service (Medical): No    Lack of Transportation (Non-Medical): No  Physical Activity: Sufficiently Active (05/13/2024)   Exercise Vital Sign    Days of Exercise per Week: 5 days    Minutes of Exercise per Session: 30 min  Stress: No Stress  Concern Present (05/13/2024)   [employer] of Occupational Health - Occupational Stress Questionnaire    Feeling of Stress: Not at all  Social Connections: Moderately Integrated (05/13/2024)   Social Connection and Isolation Panel    Frequency of Communication with Friends and Family: More than three times a week    Frequency of Social Gatherings with Friends and Family: More than three times a week    Attends Religious Services: More than 4 times per year    Active Member of [employer] or Organizations: No    Attends Banker Meetings: Never    Marital Status: Married    Tobacco Counseling Counseling given: Not Answered    Clinical Intake:  Pre-visit preparation completed: Yes  Pain : No/denies pain     Diabetes: Yes CBG done?: No Did pt. bring in CBG monitor from home?: No  Lab Results  Component Value Date   HGBA1C 5.8 (H) 04/10/2024   HGBA1C 5.8 (H) 11/13/2022   HGBA1C 6.9 (H) 11/02/2021     How often do you need to have someone help you when you read instructions, pamphlets, or other written materials from your doctor or  pharmacy?: 1 - Never  Interpreter Needed?: No  Information entered by :: Charmaine Bloodgood LPN   Activities of Daily Living     05/13/2024    9:56 AM  In your present state of health, do you have any difficulty performing the following activities:  Hearing? 0  Vision? 0  Difficulty concentrating or making decisions? 0  Walking or climbing stairs? 0  Dressing or bathing? 0  Doing errands, shopping? 0  Preparing Food and eating ? N  Using the Toilet? N  In the past six months, have you accidently leaked urine? N  Do you have problems with loss of bowel control? N  Managing your Medications? N  Managing your Finances? N  Housekeeping or managing your Housekeeping? N    Patient Care Team: Kenneth Butler DASEN, MD as PCP - General (Family Medicine) Jinny Carmine, MD as Consulting Physician (Gastroenterology) Carolee Manus Brown., MD as Consulting Physician (Ophthalmology) Camelia Mabel LABOR, MD as Referring Physician (Orthopedic Surgery) Nicholaus Sherlean CROME, [facility] (Inactive) as Pharmacist (Pharmacist) Belinda Cough, MD as Consulting Physician (General Surgery)  I have updated your Care Teams any recent Medical Services you may have received from other providers in the past year.     Assessment:   This is a routine wellness examination for Kenneth Brown.  Hearing/Vision screen Hearing Screening - Comments:: Denies hearing difficulties   Vision Screening - Comments:: Wears rx glasses - up to date with routine eye exams with Dr. Carolee    Goals Addressed             This Visit's Progress    Exercise    On track    Recommend walking 3 days a week for 20-30 minutes.       Depression Screen     05/13/2024   10:05 AM 04/07/2024   11:53 AM 05/07/2023   11:53 AM 12/04/2022    9:10 AM 11/13/2022    8:53 AM 03/22/2022    9:54 AM 06/15/2021   10:24 AM  PHQ 2/9 Scores  PHQ - 2 Score 0 0 0 0 0 0 0  PHQ- 9 Score 2 2     0    Fall Risk     05/13/2024   10:06 AM 04/07/2024   11:53 AM 12/04/2022     9:10 AM 11/13/2022  8:53 AM 03/22/2022    9:56 AM  Fall Risk   Falls in the past year? 0 0 0 0 0  Number falls in past yr: 0 0 0 0 0  Injury with Fall? 0 0 0 0 0  Risk for fall due to : No Fall Risks  No Fall Risks No Fall Risks No Fall Risks  Follow up Falls prevention discussed;Education provided;Falls evaluation completed Falls evaluation completed Falls prevention discussed Falls prevention discussed  Falls prevention discussed      Data saved with a previous flowsheet row definition    MEDICARE RISK AT HOME:  Medicare Risk at Home Any stairs in or around the home?: No If so, are there any without handrails?: No Home free of loose throw rugs in walkways, pet beds, electrical cords, etc?: Yes Adequate lighting in your home to reduce risk of falls?: Yes Life alert?: No Use of a cane, walker or w/c?: No Grab bars in the bathroom?: Yes Shower chair or bench in shower?: No Elevated toilet seat or a handicapped toilet?: Yes  TIMED UP AND GO:  Was the test performed?  No  Cognitive Function: 6CIT completed        05/13/2024   10:06 AM 03/22/2022    9:59 AM 03/15/2021    9:48 AM 02/19/2017    1:17 PM  6CIT Screen  What Year? 0 points 0 points 0 points 0 points  What month? 0 points 0 points 0 points 0 points  What time? 0 points 0 points 0 points 0 points  Count back from 20 0 points 0 points 0 points 0 points  Months in reverse 0 points 0 points 2 points 0 points  Repeat phrase 0 points 2 points 0 points 0 points  Total Score 0 points 2 points 2 points 0 points    Immunizations Immunization History  Administered Date(s) Administered   Fluad Quad(high Dose 65+) 08/10/2019   Influenza, High Dose Seasonal PF 10/03/2016, 08/26/2017, 09/03/2018   Influenza,inj,quad, With Preservative 07/29/2016   Moderna Sars-Covid-2 Vaccination 12/26/2019, 08/25/2020, 03/13/2021   Pneumococcal Conjugate-13 01/24/2016   Pneumococcal Polysaccharide-23 02/19/2017   Tdap 05/20/2013    Zoster, Live 05/20/2013    Screening Tests Health Maintenance  Topic Date Due   Hepatitis B Vaccines (1 of 3 - Risk 3-dose series) Never done   DTaP/Tdap/Td (2 - Td or Tdap) 05/21/2023   OPHTHALMOLOGY EXAM  06/22/2023   COVID-19 Vaccine (4 - 2024-25 season) 06/30/2023   INFLUENZA VACCINE  05/29/2024   HEMOGLOBIN A1C  10/10/2024   FOOT EXAM  04/07/2025   Diabetic kidney evaluation - eGFR measurement  04/10/2025   Diabetic kidney evaluation - Urine ACR  04/10/2025   Medicare Annual Wellness (AWV)  05/13/2025   Colonoscopy  06/12/2028   Pneumococcal Vaccine: 50+ Years  Completed   Hepatitis C Screening  Completed   HPV VACCINES  Aged Out   Meningococcal B Vaccine  Aged Out   Zoster Vaccines- Shingrix  Discontinued    Health Maintenance  Health Maintenance Due  Topic Date Due   Hepatitis B Vaccines (1 of 3 - Risk 3-dose series) Never done   DTaP/Tdap/Td (2 - Td or Tdap) 05/21/2023   OPHTHALMOLOGY EXAM  06/22/2023   COVID-19 Vaccine (4 - 2024-25 season) 06/30/2023   Health Maintenance Items Addressed: Request sent for last diabetic eye exam notes   Additional Screening:  Vision Screening: Recommended annual ophthalmology exams for early detection of glaucoma and other disorders of the eye. Would you  like a referral to an eye doctor? No    Dental Screening: Recommended annual dental exams for proper oral hygiene  Community Resource Referral / Chronic Care Management: CRR required this visit?  No   CCM required this visit?  No   Plan:    I have personally reviewed and noted the following in the patient's chart:   Medical and social history Use of alcohol, tobacco or illicit drugs  Current medications and supplements including opioid prescriptions. Patient is not currently taking opioid prescriptions. Functional ability and status Nutritional status Physical activity Advanced directives List of other physicians Hospitalizations, surgeries, and ER visits in  previous 12 months Vitals Screenings to include cognitive, depression, and falls Referrals and appointments  In addition, I have reviewed and discussed with patient certain preventive protocols, quality metrics, and best practice recommendations. A written personalized care plan for preventive services as well as general preventive health recommendations were provided to patient.   Lavelle Pfeiffer [place], [place]   2/83/7974   After Visit Summary: (MyChart) Due to this being a telephonic visit, the after visit summary with patients personalized plan was offered to patient via MyChart   Notes: Nothing significant to report at this time.

## 2024-05-13 NOTE — Patient Instructions (Signed)
 Kenneth Brown , Thank you for taking time out of your busy schedule to complete your Annual Wellness Visit with me. I enjoyed our conversation and look forward to speaking with you again next year. I, as well as your care team,  appreciate your ongoing commitment to your health goals. Please review the following plan we discussed and let me know if I can assist you in the future. Your Game plan/ To Do List    Follow up Visits: Next Medicare AWV with our clinical staff: In 1 year    Have you seen your provider in the last 6 months (3 months if uncontrolled diabetes)? Yes Next Office Visit with your provider: To be scheduled   Clinician Recommendations:  Aim for 30 minutes of exercise or brisk walking, 6-8 glasses of water , and 5 servings of fruits and vegetables each day.       This is a list of the screening recommended for you and due dates:  Health Maintenance  Topic Date Due   Hepatitis B Vaccine (1 of 3 - Risk 3-dose series) Never done   DTaP/Tdap/Td vaccine (2 - Td or Tdap) 05/21/2023   Eye exam for diabetics  06/22/2023   COVID-19 Vaccine (4 - 2024-25 season) 06/30/2023   Flu Shot  05/29/2024   Hemoglobin A1C  10/10/2024   Complete foot exam   04/07/2025   Yearly kidney function blood test for diabetes  04/10/2025   Yearly kidney health urinalysis for diabetes  04/10/2025   Medicare Annual Wellness Visit  05/13/2025   Colon Cancer Screening  06/12/2028   Pneumococcal Vaccine for age over 23  Completed   Hepatitis C Screening  Completed   HPV Vaccine  Aged Out   Meningitis B Vaccine  Aged Out   Zoster (Shingles) Vaccine  Discontinued    Advanced directives: (ACP Link)Information on Advanced Care Planning can be found at [place]  Secretary of State Advance Health Care Directives Advance Health Care Directives. [email or web address]   Advance Care Planning is important because it:  [x]  Makes sure you receive the medical care that is consistent with your values, goals, and  preferences  [x]  It provides guidance to your family and loved ones and reduces their decisional burden about whether or not they are making the right decisions based on your wishes.  Follow the link provided in your after visit summary or read over the paperwork we have mailed to you to help you started getting your Advance Directives in place. If you need assistance in completing these, please reach out to us  so that we can help you!  See attachments for Preventive Care and Fall Prevention Tips.

## 2024-06-09 DIAGNOSIS — K429 Umbilical hernia without obstruction or gangrene: Secondary | ICD-10-CM | POA: Diagnosis not present

## 2024-06-10 ENCOUNTER — Other Ambulatory Visit: Payer: Self-pay | Admitting: Family Medicine

## 2024-06-10 DIAGNOSIS — E119 Type 2 diabetes mellitus without complications: Secondary | ICD-10-CM

## 2024-06-30 DIAGNOSIS — K429 Umbilical hernia without obstruction or gangrene: Secondary | ICD-10-CM | POA: Diagnosis not present

## 2024-08-03 DIAGNOSIS — E113393 Type 2 diabetes mellitus with moderate nonproliferative diabetic retinopathy without macular edema, bilateral: Secondary | ICD-10-CM | POA: Diagnosis not present

## 2024-08-17 ENCOUNTER — Telehealth: Payer: Self-pay | Admitting: Family Medicine

## 2024-08-17 NOTE — Telephone Encounter (Signed)
 Patient's wife Montie Judith) came to the office to drop off Patient Assistance Program Application Form from [employer]. Requesting call when forms completed; stated there are additional forms at home that need to be faxed along with forms submitted for completion. Cindy aware of form completion fee which should be paid upon completion.  Forms placed in green folder on provider's desk.  Please advise at [phone], or [phone] when forms completed.

## 2024-08-27 ENCOUNTER — Other Ambulatory Visit: Payer: Self-pay | Admitting: Family Medicine

## 2024-08-27 DIAGNOSIS — E119 Type 2 diabetes mellitus without complications: Secondary | ICD-10-CM

## 2024-09-01 ENCOUNTER — Telehealth: Payer: Self-pay

## 2024-09-01 NOTE — Telephone Encounter (Signed)
 Copied from CRM [id number]. Topic: Clinical - Prescription Issue >> Sep 01, 2024  4:41 PM Rea ORN wrote: Reason for CRM: pt with Montie calling to follow up on request for rx for Jardiance . Pt brought paperwork up to the clinic last week and received a call that the paperwork was done. When they picked it up, they realized it was missing the rx for for Jardiance .   Please call back to advise [phone].

## 2024-09-07 NOTE — Telephone Encounter (Signed)
 Patient's wife Montie came to the office with additional paperwork to add to the forms completed by Dr. Duanne at no charge.  Successfully faxed to [employer] with a confirmation time stamp of Nov/07/2024 9:49:56 AM.  Twyla patient's wife Montie original paperwork with copy of confirmation sheet with date/time stamp.   Also successfully faxed to [facility] with a confirmation time stamp of Nov/07/2024 11:01:27 AM.

## 2024-11-02 ENCOUNTER — Other Ambulatory Visit: Payer: Self-pay

## 2024-11-02 ENCOUNTER — Telehealth: Payer: Self-pay

## 2024-11-02 MED ORDER — ACCU-CHEK GUIDE W/DEVICE KIT: PACK | 0 refills | Status: AC

## 2024-11-02 NOTE — Telephone Encounter (Signed)
 Copied from CRM #[id number]. Topic: Clinical - Medication Question >> Nov 02, 2024 12:43 PM Revonda D wrote: Reason for CRM: [place] with [facility] stated that the pt's insurance no longer covers the One Touch diabetic supplies and needs a new prescription sent. [place] stated that they need an order for the Accu-Chek test strips, Accu-Chek guide machine, and the Accu-Chek soft click lancets.

## 2024-11-02 NOTE — Telephone Encounter (Signed)
 Sent in medication

## 2025-05-19 ENCOUNTER — Ambulatory Visit
